# Patient Record
Sex: Female | Born: 1935 | Race: White | Hispanic: No | Smoking: Former smoker
Health system: Southern US, Community
[De-identification: ages and names within clinical notes are randomized; demographics above are authoritative.]

## PROBLEM LIST (undated history)

## (undated) DIAGNOSIS — C50919 Malignant neoplasm of unspecified site of unspecified female breast: Secondary | ICD-10-CM

## (undated) DIAGNOSIS — D126 Benign neoplasm of colon, unspecified: Secondary | ICD-10-CM

## (undated) DIAGNOSIS — C8590 Non-Hodgkin lymphoma, unspecified, unspecified site: Secondary | ICD-10-CM

## (undated) DIAGNOSIS — M8580 Other specified disorders of bone density and structure, unspecified site: Secondary | ICD-10-CM

## (undated) DIAGNOSIS — E785 Hyperlipidemia, unspecified: Secondary | ICD-10-CM

## (undated) DIAGNOSIS — C9590 Leukemia, unspecified not having achieved remission: Secondary | ICD-10-CM

## (undated) DIAGNOSIS — K5790 Diverticulosis of intestine, part unspecified, without perforation or abscess without bleeding: Secondary | ICD-10-CM

## (undated) DIAGNOSIS — M1990 Unspecified osteoarthritis, unspecified site: Secondary | ICD-10-CM

## (undated) DIAGNOSIS — K219 Gastro-esophageal reflux disease without esophagitis: Secondary | ICD-10-CM

## (undated) DIAGNOSIS — A048 Other specified bacterial intestinal infections: Secondary | ICD-10-CM

## (undated) DIAGNOSIS — C9500 Acute leukemia of unspecified cell type not having achieved remission: Secondary | ICD-10-CM

## (undated) DIAGNOSIS — F0390 Unspecified dementia without behavioral disturbance: Secondary | ICD-10-CM

## (undated) DIAGNOSIS — C9110 Chronic lymphocytic leukemia of B-cell type not having achieved remission: Secondary | ICD-10-CM

## (undated) DIAGNOSIS — Z853 Personal history of malignant neoplasm of breast: Secondary | ICD-10-CM

## (undated) HISTORY — PX: CATARACT EXTRACTION, BILATERAL: SHX1313

## (undated) HISTORY — DX: Leukemia, unspecified not having achieved remission: C95.90

## (undated) HISTORY — DX: Benign neoplasm of colon, unspecified: D12.6

## (undated) HISTORY — DX: Non-Hodgkin lymphoma, unspecified, unspecified site: C85.90

## (undated) HISTORY — DX: Diverticulosis of intestine, part unspecified, without perforation or abscess without bleeding: K57.90

## (undated) HISTORY — DX: Other specified disorders of bone density and structure, unspecified site: M85.80

## (undated) HISTORY — DX: Hyperlipidemia, unspecified: E78.5

## (undated) HISTORY — DX: Malignant neoplasm of unspecified site of unspecified female breast: C50.919

## (undated) HISTORY — PX: CATARACT EXTRACTION: SUR2

## (undated) HISTORY — PX: LUMPECTOMY: SHX4764

## (undated) HISTORY — PX: EYE SURGERY: SHX253

---
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
## 1984-03-16 HISTORY — PX: OTHER SURGICAL HISTORY: SHX169

## 1997-10-30 ENCOUNTER — Other Ambulatory Visit: Admission: RE | Admit: 1997-10-30 | Discharge: 1997-10-30 | Payer: Self-pay | Admitting: Cardiology

## 1998-12-13 ENCOUNTER — Other Ambulatory Visit: Admission: RE | Admit: 1998-12-13 | Discharge: 1998-12-13 | Payer: Self-pay | Admitting: Cardiology

## 1999-05-02 ENCOUNTER — Encounter: Admission: RE | Admit: 1999-05-02 | Discharge: 1999-05-02 | Payer: Self-pay | Admitting: Cardiology

## 1999-05-02 ENCOUNTER — Encounter: Payer: Self-pay | Admitting: Cardiology

## 1999-07-17 ENCOUNTER — Encounter: Payer: Self-pay | Admitting: Cardiology

## 1999-07-17 ENCOUNTER — Encounter: Admission: RE | Admit: 1999-07-17 | Discharge: 1999-07-17 | Payer: Self-pay | Admitting: Cardiology

## 1999-07-24 ENCOUNTER — Encounter: Admission: RE | Admit: 1999-07-24 | Discharge: 1999-07-24 | Payer: Self-pay | Admitting: Cardiology

## 1999-07-24 ENCOUNTER — Encounter: Payer: Self-pay | Admitting: Cardiology

## 2000-03-30 ENCOUNTER — Other Ambulatory Visit: Admission: RE | Admit: 2000-03-30 | Discharge: 2000-03-30 | Payer: Self-pay | Admitting: Family Medicine

## 2000-07-05 ENCOUNTER — Encounter: Payer: Self-pay | Admitting: Family Medicine

## 2000-07-05 ENCOUNTER — Ambulatory Visit (HOSPITAL_COMMUNITY): Admission: RE | Admit: 2000-07-05 | Discharge: 2000-07-05 | Payer: Self-pay | Admitting: Family Medicine

## 2000-08-23 ENCOUNTER — Encounter: Payer: Self-pay | Admitting: Internal Medicine

## 2000-08-23 ENCOUNTER — Encounter: Admission: RE | Admit: 2000-08-23 | Discharge: 2000-08-23 | Payer: Self-pay | Admitting: Internal Medicine

## 2001-09-08 ENCOUNTER — Encounter: Admission: RE | Admit: 2001-09-08 | Discharge: 2001-09-08 | Payer: Self-pay | Admitting: Internal Medicine

## 2001-09-08 ENCOUNTER — Encounter: Payer: Self-pay | Admitting: Internal Medicine

## 2002-10-27 ENCOUNTER — Encounter: Payer: Self-pay | Admitting: Internal Medicine

## 2002-10-27 ENCOUNTER — Encounter: Admission: RE | Admit: 2002-10-27 | Discharge: 2002-10-27 | Payer: Self-pay | Admitting: Internal Medicine

## 2003-12-28 ENCOUNTER — Encounter: Admission: RE | Admit: 2003-12-28 | Discharge: 2003-12-28 | Payer: Self-pay | Admitting: Internal Medicine

## 2004-04-14 ENCOUNTER — Inpatient Hospital Stay (HOSPITAL_COMMUNITY): Admission: AD | Admit: 2004-04-14 | Discharge: 2004-04-16 | Payer: Self-pay | Admitting: Internal Medicine

## 2004-05-06 ENCOUNTER — Encounter: Admission: RE | Admit: 2004-05-06 | Discharge: 2004-05-06 | Payer: Self-pay | Admitting: Specialist

## 2004-05-20 ENCOUNTER — Encounter: Admission: RE | Admit: 2004-05-20 | Discharge: 2004-05-20 | Payer: Self-pay | Admitting: Specialist

## 2005-02-16 ENCOUNTER — Encounter: Admission: RE | Admit: 2005-02-16 | Discharge: 2005-02-16 | Payer: Self-pay | Admitting: Internal Medicine

## 2006-02-26 ENCOUNTER — Encounter: Admission: RE | Admit: 2006-02-26 | Discharge: 2006-02-26 | Payer: Self-pay | Admitting: Internal Medicine

## 2006-03-08 ENCOUNTER — Encounter: Admission: RE | Admit: 2006-03-08 | Discharge: 2006-03-08 | Payer: Self-pay | Admitting: Internal Medicine

## 2006-08-30 ENCOUNTER — Encounter: Admission: RE | Admit: 2006-08-30 | Discharge: 2006-08-30 | Payer: Self-pay | Admitting: Internal Medicine

## 2006-11-09 ENCOUNTER — Inpatient Hospital Stay (HOSPITAL_COMMUNITY): Admission: AD | Admit: 2006-11-09 | Discharge: 2006-11-10 | Payer: Self-pay | Admitting: Internal Medicine

## 2007-04-07 ENCOUNTER — Encounter: Admission: RE | Admit: 2007-04-07 | Discharge: 2007-04-07 | Payer: Self-pay | Admitting: Internal Medicine

## 2007-08-01 ENCOUNTER — Ambulatory Visit: Payer: Self-pay | Admitting: Gastroenterology

## 2007-08-01 ENCOUNTER — Encounter (INDEPENDENT_AMBULATORY_CARE_PROVIDER_SITE_OTHER): Payer: Self-pay | Admitting: *Deleted

## 2007-08-15 ENCOUNTER — Ambulatory Visit: Payer: Self-pay | Admitting: Gastroenterology

## 2008-03-16 HISTORY — PX: KNEE ARTHROSCOPY: SUR90

## 2008-06-22 ENCOUNTER — Encounter: Admission: RE | Admit: 2008-06-22 | Discharge: 2008-06-22 | Payer: Self-pay | Admitting: Internal Medicine

## 2008-07-20 ENCOUNTER — Encounter: Admission: RE | Admit: 2008-07-20 | Discharge: 2008-07-20 | Payer: Self-pay | Admitting: Internal Medicine

## 2008-08-31 ENCOUNTER — Ambulatory Visit (HOSPITAL_COMMUNITY): Admission: RE | Admit: 2008-08-31 | Discharge: 2008-08-31 | Payer: Self-pay | Admitting: Internal Medicine

## 2009-01-14 ENCOUNTER — Ambulatory Visit (HOSPITAL_BASED_OUTPATIENT_CLINIC_OR_DEPARTMENT_OTHER): Admission: RE | Admit: 2009-01-14 | Discharge: 2009-01-14 | Payer: Self-pay | Admitting: Specialist

## 2009-02-13 ENCOUNTER — Encounter: Admission: RE | Admit: 2009-02-13 | Discharge: 2009-03-14 | Payer: Self-pay | Admitting: Specialist

## 2009-03-17 ENCOUNTER — Encounter: Admission: RE | Admit: 2009-03-17 | Discharge: 2009-06-15 | Payer: Self-pay | Admitting: Specialist

## 2009-07-29 ENCOUNTER — Encounter: Admission: RE | Admit: 2009-07-29 | Discharge: 2009-07-29 | Payer: Self-pay | Admitting: Internal Medicine

## 2010-04-15 NOTE — Miscellaneous (Signed)
Summary: [facility] previsit/allergies  Clinical Lists Changes  Allergies: Added new allergy or adverse reaction of * CLARITIN D Added new allergy or adverse reaction of * DETROL LA

## 2010-04-15 NOTE — Miscellaneous (Signed)
Summary: [facility] previsit/ prep  Clinical Lists Changes  Medications: Added new medication of MOVIPREP 100 GM  SOLR (PEG-KCL-NACL-NASULF-NA ASC-C) As per prep instructions. - Signed Rx of MOVIPREP 100 GM  SOLR (PEG-KCL-NACL-NASULF-NA ASC-C) As per prep instructions.;  #1 x 0;  Signed;  Entered by: Wyona Almas RN;  Authorized by: Mardella Layman MD [facility];  Method used: Print then Give to Patient Allergies: Added new allergy or adverse reaction of PENICILLIN Observations: Added new observation of NKA: F (08/01/2007 9:55)    Prescriptions: MOVIPREP 100 GM  SOLR (PEG-KCL-NACL-NASULF-NA ASC-C) As per prep instructions.  #1 x 0   Entered by:   Wyona Almas RN   Authorized by:   Mardella Layman MD [facility]   Signed by:   Wyona Almas RN on 08/01/2007   Method used:   Print then Give to Patient   RxID:   [id number]

## 2010-04-15 NOTE — Procedures (Signed)
Summary: Colonoscopy   Colonoscopy  Procedure date:  08/15/2007  Findings:      Location:  [facility].    Procedures Next Due Date:    Colonoscopy: 08/2012  Patient Name: Brianna York, Brianna York MRN:  Procedure Procedures: Colonoscopy CPT: 16109.  Personnel: Endoscopist: Vania Rea. Jarold Motto, MD.  Exam Location: Exam performed in Outpatient Clinic. Outpatient  Patient Consent: Procedure, Alternatives, Risks and Benefits discussed, consent obtained, from patient. Consent was obtained by the RN.  Indications  Surveillance of: Adenomatous Polyp(s).  Increased Risk Screening: For family history of colorectal neoplasia, in  parent  History Comments: Pt. history reviewed/updated, physical exam performed prior to initiation of sedation? yes Exam Exam: Extent of exam reached: Cecum, extent intended: Cecum.  The cecum was identified by appendiceal orifice and IC valve. Patient position: on left side. Time to Cecum: 00:05:12. Time for Withdrawl: 00:04:50. Colon retroflexion performed. Images taken. ASA Classification: II. Tolerance: good.  Monitoring: Pulse and BP monitoring, Oximetry used. Supplemental O2 given. at 2 Liters.  Colon Prep Used Golytely for colon prep. Prep results: excellent.  Sedation Meds: Patient assessed and found to be appropriate for moderate (conscious) sedation. Fentanyl 75 mcg. given IV. Versed 7 mg. given IV.  Instrument(s): CF 140L. Serial [id number].  Findings - NORMAL EXAM: Cecum to Splenic Flexure. Not Seen: Polyps. AVM's. Colitis. Tumors. Crohn's.  - DIVERTICULOSIS: Descending Colon to Sigmoid Colon. Not bleeding. ICD9: Diverticulosis, Colon: 562.10. Comments: Scattered small mouthed tics noted.  - NORMAL EXAM: Sigmoid Colon to Rectum. AVM's. Tumors. Crohn's. Hemorrhoids.   Assessment  Diagnoses: 562.10: Diverticulosis, Colon.   Comments: Family History of colon CA in her mother.... Events  Unplanned Interventions: No  intervention was required.  Plans Medication Plan: Referring provider to order medications.  Patient Education: Patient given standard instructions for: Diverticulosis. Patient instructed to get routine colonoscopy every 5 years.  Disposition: After procedure patient sent to recovery. After recovery patient sent home.  Scheduling/Referral: Follow-Up prn.     cc.   Loraine Leriche Perini,MD   This report was created from the original endoscopy report, which was reviewed and signed by the above listed endoscopist.

## 2010-06-18 LAB — POCT HEMOGLOBIN-HEMACUE: Hemoglobin: 14.6 g/dL (ref 12.0–15.0)

## 2010-07-16 ENCOUNTER — Other Ambulatory Visit: Payer: Self-pay | Admitting: Internal Medicine

## 2010-07-16 DIAGNOSIS — Z1231 Encounter for screening mammogram for malignant neoplasm of breast: Secondary | ICD-10-CM

## 2010-07-29 NOTE — Cardiovascular Report (Signed)
NAMEDONELL, SLIWINSKI NO.:  [id number]   MEDICAL RECORD NO.:  [id number]          PATIENT TYPE:  INP   LOCATION:  3705                         FACILITY:  [facility]   PHYSICIAN:  Vesta Mixer, M.D. DATE OF BIRTH:  1935-02-12   DATE OF PROCEDURE:  11/10/2006  DATE OF DISCHARGE:                            CARDIAC CATHETERIZATION   HISTORY:  Ms. Ragan is a 76 year old female with a history of  hyperlipidemia and a strong family history of coronary artery disease.  She is referred for heart catheterization for further evaluation.   She has had some chest pains and intrascapular pain which were very  worrisome for cardiac disease.  She also described having an elephant on  her chest-like sensation.   The procedure was left heart catheterization with coronary angiography.  The right femoral artery was easily cannulated using the modified  Seldinger technique.   HEMODYNAMICS:  LV pressure was 142/18 with an aortic pressure of 142/66.   ANGIOGRAPHY:  Left main:  The left main is smooth and normal.   The left anterior descending artery is a moderate-sized vessel.  It has  perhaps a few minor luminal irregularities but is essentially normal.   The first diagonal artery is a moderate-sized vessel and is normal.   The ramus intermediate artery is smooth and normal.  The left circumflex  artery is large and dominant.  It is smooth and normal throughout its  course.   The right coronary artery is small and is nondominant.   The left ventriculogram reveals normal/supernormal left ventricular  systolic function.  Ejection fraction is 65-70%.   COMPLICATIONS:  None.   CONCLUSIONS:  Fairly smooth and normal coronary arteries.  There were no  obstructive lesions to suggest that this chest pain was due to cardiac  disease.  We will continue with medical therapy and she will follow up  with Dr. Waynard Edwards as needed.           ______________________________  Vesta Mixer, M.D.    PJN/MEDQ  D:  11/10/2006  T:  11/11/2006  Job:  [id number]   cc:   Loraine Leriche A. Perini, M.D.

## 2010-07-29 NOTE — Consult Note (Signed)
[name], York NO.:  [id number]   MEDICAL RECORD NO.:  [id number]          PATIENT TYPE:  INP   LOCATION:  3705                         FACILITY:  [facility]   PHYSICIAN:  Vesta Mixer, M.D. DATE OF BIRTH:  1934-04-08   DATE OF CONSULTATION:  11/09/2006  DATE OF DISCHARGE:                                 CONSULTATION   Brianna York is a 76 year old female who was admitted with episodes  of chest pain.   Brianna York has a history of hyperlipidemia.  She was admitted with  intermittent episodes of chest pain.  This chest pain is described as a  pressure, like a elephant on my chest.  The pain typically occurs when  she gets out of bed.  It lasts for several minutes and then resolves as  she moves about.  There is no association with eating or drinking.  There is no association with exercise.  She tries to walk about a mile  every day and has not noticed any chest pressure with her exercise.  She  denies any PND or orthopnea.  She denies any syncope or presyncope.  She  denies any easy bruising.  She denies any cough or sputum production.  She denies any change in her bowel habits, blood in her urine or blood  her stool.   CURRENT MEDICATIONS:  Lipitor and Fosamax.  Toprol 25 and aspirin were  added this admission.   ALLERGIES:  She is allergic to PENICILLIN and DETROL.   PAST MEDICAL HISTORY:  1. Hyperlipidemia.  2. Osteoporosis.   SOCIAL HISTORY:  The patient has a history of remote smoking.  She  drinks alcohol occasionally.   FAMILY HISTORY:  Is positive cardiac problems.   REVIEW OF SYSTEMS:  Is reviewed and is essentially negative except as  noted in the HPI.   PHYSICAL EXAMINATION:  GENERAL:  She is an elderly female in no acute  distress.  She is currently pain free.  VITAL SIGNS:  Her temperature is 97.5, blood pressure 140/76, heart rate  78.  HEENT/NECK:  Exam reveals 2+ carotids.  No bruits, no JVD, no  thyromegaly.  LUNGS:  Clear  to call auscultation.  HEART:  Regular rate, S1-S2.  ABDOMEN:  Exam reveals good bowel sounds and is nontender.  EXTREMITIES:  Show no clubbing, cyanosis or edema.  NEUROLOGIC:  Exam is nonfocal.   EKG reveals normal sinus rhythm.  She has no ST or T wave change.   Chest x-ray is pending.   Laboratory data is pending.   Brianna York presents with some episodes of chest pain that are somewhat  concerning.  She describes the pressure is an elephant on her chest week  with radiation to the interscapular region.  I have discussed the case  with Dr. Eloise Harman.  She has never had many complaints.  At this point, I  think her symptoms are concerning, and we should proceed with heart  catheterization.  We will continue with heparin for the time being.  We  will discuss cardiac catheterization with the patient.  ______________________________  Vesta Mixer, M.D.     PJN/MEDQ  D:  11/09/2006  T:  11/10/2006  Job:  [id number]   cc:   Barry Dienes. Eloise Harman, M.D.  Loraine Leriche A. Perini, M.D.

## 2010-07-29 NOTE — H&P (Signed)
NAMEBRISTAL, Brianna York             ACCOUNT NO.:  [id number]   MEDICAL RECORD NO.:  [id number]          PATIENT TYPE:  INP   LOCATION:  3705                         FACILITY:  [facility]   PHYSICIAN:  Barry Dienes. Eloise Harman, M.D.DATE OF BIRTH:  09-18-1934   DATE OF ADMISSION:  11/09/2006  DATE OF DISCHARGE:                              HISTORY & PHYSICAL   CHIEF COMPLAINT:  Chest pain.   HISTORY OF PRESENT ILLNESS:  The patient is a 76 year old white female  with no history of coronary artery disease.  Three to four days ago she  began to have a mild and persistent substernal chest pain that was  somewhat worse with breathing in deep and not associated with shortness  of breath, diaphoresis, or radiation.  Yesterday morning when she first  woke up, she had severe substernal chest tightness which felt like an  elephant on her chest.  This discomfort lasted approximately 5 minutes  and resolved spontaneously with getting up and going about her day.  She  was well throughout the rest of yesterday.  Again this morning at  approximately 5:00 a.m., she awoke up with a similar feeling of an  elephant on her chest that lasted for approximately 5 minutes and  resolved with getting up.  She has a history of mild gastroesophageal  reflux disease and has been taking Prilosec for approximately the past  week.  This did not seem to be associated with a change in her symptoms.  At the time of hospital admission, she felt fine with no chest pain,  shortness of breath, palpitations, back pain, fever, or chills.   PAST MEDICAL HISTORY:  Otherwise significant for:  1. Osteoporosis.  2. Osteoarthritis, particularly of the lumbar spine.  3. Vitamin D deficiency with a 25 hydroxy vitamin D level 16.9 in      April 2008.  4. Chronic low back pain and scoliosis with workup showing severe      lumbar spine stenosis for which she received cortisone injections      at the left L5-S1 area in 2006.  5. Mild  gastroesophageal reflux disease.  6. 2004 adenoma polyps diagnosed on colonoscopy.  7. Mild stress urinary incontinence.  8. Atrophic vaginitis.  9. October 1997 episode of amaurosis fugax on the right with a      neurology evaluation and full workup unremarkable.   MEDICATIONS PRIOR TO ADMISSION:  1. Fosamax D 70 mg once weekly.  2. Lipitor 10 mg daily.  3. Vicodin 5/500 as needed for osteoarthritis pain.  4. Robaxin 750 mg p.o. b.i.d. p.r.n. muscle spasms.  5. Caltrate D 600 p.r.n.  6. Aspirin 81 mg daily.  7. Vitamin C p.r.n.   ALLERGIES:  1. PENICILLIN.  2. CLARITIN-D has been associated with feeling anxious.  3. DETROL LA has been associated with nausea.   PAST SURGICAL HISTORY:  1. Eye surgeries x2 for anopia  2. Several C-sections.  3. Bilateral vein stripping of the legs.  4. Dilatation and curettage.  5. 2006 lumbar spine cortisone injections.   SOCIAL HISTORY:  She has been a widow since 68.  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  She has tobacco history of 30 pack years, stopped in 1990, and no  history of alcohol abuse.   FAMILY HISTORY:  Father died at age 10 of leukemia.  Her mother died at  age 52 of emphysema. She has three sisters, two of which had diabetes  mellitus, type 2.  She has five children that are scattered in [place]  [place], [place], and [place]   REVIEW OF SYSTEMS:  Lately she has had a dry cough without fever or  chills.  She has not had constipation, diarrhea, abdominal pain, rectal  bleeding, nausea, vomiting, dysuria or frequency, headaches, anxiety, or  depression.   PHYSICAL EXAMINATION:  VITAL SIGNS:  Blood pressure 130/70, pulse 76,  respirations 20, temperature  98.4, weight 141 pounds.  GENERAL:  She is a mildly overweight white female who is in no apparent  distress while sitting upright in a chair.  HEENT:  Exam was within normal limits.  NECK:  Supple without jugular  venous distention or carotid bruit.  CHEST:  Was clear to auscultation.  There is no spine tenderness to  palpation.  HEART:  Had a regular rate and rhythm without significant murmur or  gallop.  ABDOMEN:  Had normal bowel sounds and no hepatosplenomegaly or  tenderness.  EXTREMITIES:  Without cyanosis, clubbing, or edema.  NEUROLOGIC:  Exam was nonfocal   LABORATORY STUDIES:  Office EKG showed the following.  1. Normal sinus rhythm.  2. RSR prime in lead V1.  3. Low-amplitude QRS voltages in the precordial leads.  4. No significant interval change versus June 29, 2005.   Hospital admission labs including cardiac isoenzymes, CMET, CBC, and  chest x-ray were pending at the time of dictation.   IMPRESSION AND PLAN:  1. Chest pain:  She has some concerning features for coronary artery      disease with significant diffuse substernal pressure.  Her symptoms      occurred at rest and could be due to vasospasm in the face of no      baseline coronary disease.  Less likely, her symptoms could be due      to gastroesophageal reflux disease, gastritis, or hepatobiliary      pathology.  I plan to admit her to a telemetry bed and check serial      cardiac isoenzymes and EKGs.  She will be placed on a beta blocker      and low-dose calcium channel blocker as well as high-dose proton      pump inhibitor      treatment.  A cardiologist has been asked to assess her case.  2. Osteoporosis:  Stable on her current medical regimen he has  3. Lumbar spine degenerative disk disease:  Again, stable on her      current medical regimen.           ______________________________  Barry Dienes. Eloise Harman, M.D.     DGP/MEDQ  D:  11/09/2006  T:  11/10/2006  Job:  [id number]   cc:   Vania Rea. Jarold Motto, MD, [place], FACP, FAGA  Jene Every, M.D.  Vesta Mixer, M.D.  Delon Sacramento, M.D.

## 2010-08-01 NOTE — H&P (Signed)
Brianna York, Brianna York             ACCOUNT NO.:  [id number]   MEDICAL RECORD NO.:  [id number]          PATIENT TYPE:  INP   LOCATION:  6702                         FACILITY:  [facility]   PHYSICIAN:  Mark A. Perini, M.D.   DATE OF BIRTH:  06/29/1934   DATE OF ADMISSION:  04/14/2004  DATE OF DISCHARGE:                                HISTORY & PHYSICAL   CLINICAL HISTORY:  Low back pain.   HISTORY OF PRESENT ILLNESS:  Brianna York is a pleasant 76 year old female  with a past medical history significant for hyperlipidemia and  osteoarthritis of the hips, knees and back, mild stress urinary incontinence  and overactive bladder.  She did have a history of some back pain somewhat  chronically and she had a history of some degenerative joint disease and  degenerative disk disease changes by an x-ray done on 2001.  She presented  to our office on April 08, 2004, reporting some on and oft back pain for  the last one to two months.  However, 10 days prior to her presentation on  April 08, 2004, she picked up some pecans in her yard and her low back  pain flared up.  For five days prior to April 08, 2004, she had severe low  back pain.  She denied any saddle anesthesia symptoms.  She had tried  Lidoderm patches, Ultracet, Aleve and ibuprofen with no help.  She denies  any fevers.  On exam, she had normal reflexes, no significant tenderness and  no straight leg raise sign bilaterally.  She was treated with a prednisone  taper and given a proton pump inhibitor, as well as Celebrex, hydrocodone  and a Lidoderm patch.  She was also shown stretches for her low back.  She  was scheduled to have an MRI of her low back.  She was unable to accomplish  an MRI due to severe pain whenever she would lie in the MRI scanner.  She  therefore had her medicine changed from hydrocodone to Tylox.  Again today  she attempted to do an MRI of her low back.  She was unable to do this due  to severe pain.  Therefore,  she presented to our office today.  She will be  admitted electively for further evaluation of her low back pain.  She  currently rates it as 7/10 in severity.   PAST MEDICAL HISTORY:  1.  Eye surgery x 2 as a toddler for ambiopia.  2.  History of three C-sections with G5, P5 parity status.  3.  History of vein stripping in the legs.  4.  History of D&C.  5.  Hyperlipidemia.  6.  Degenerative disk disease and degenerative joint disease of the back by      x-ray in 2001.  7.  Osteoarthritis of the knees, hips and back.  8.  Mild overactive bladder and stress urinary incontinence.  9.  Postmenopausal at age 62.  10. Mild gastrointestinal reflux.  11. Osteopenia.   ALLERGIES:  1.  PENICILLIN.  2.  CLARITIN.   CURRENT MEDICATIONS:  1.  Lipitor 10 mg daily.  2.  Celebrex 200 mg daily.  3.  Tylenol PM q.h.s.  4.  Vagifem twice weekly per vagina.  5.  Tums or Zantac as needed.  6.  Lidoderm pain patch recently.  7.  Oxycodone 5/500 mg two tablets q.6h. as needed for pain.   SOCIAL HISTORY:  She has been a widow since 25.  She has 5 children and 14  grandchildren.  She has two years of college education.  She has a 30-pack-  year smoking history, but quit in 1990.  She has one alcoholic drink a week.  No drug use history.   FAMILY HISTORY:  Father died at age 71 of leukemia.  Mother is still living  at age 68 and has emphysema.  She has three younger brothers, two of which  have diabetes.  Her five children are healthy.   REVIEW OF SYSTEMS:  As per the history of present illness.   PHYSICAL EXAMINATION:  VITAL SIGNS:  Blood pressure 102/70, pulse 84.  GENERAL APPEARANCE:  She is in no acute distress.  LUNGS:  Clear to auscultation bilaterally with no wheezing, rhonchi or  rales.  HEART:  Regular rate and rhythm with no murmur, rub or gallop.  ABDOMEN:  Soft and nontender.  EXTREMITIES:  She has normal sensation of her lower extremities.  There is  negative straight leg  raise sign bilaterally.  DTRs 2+ bilaterally.  Normal  sensation grossly.   LABORATORY DATA:  Pending on admission.   ASSESSMENT AND PLAN:  Severe low back pain.  I suspect this is on the basis  of degenerative joint and degenerative disk disease.  I have been unable to  get an MRI given the patient's severe pain.  I will therefore admit her and  give her IV morphine, as well as oral oxycodone.  Will obtain an orthopedic  consultation to help determine the next step.  We may do an MRI with further  pain control versus a CT scan.  She may need an epidural pain shot or other  management to help treat her symptoms.      MAP/MEDQ  D:  04/14/2004  T:  04/14/2004  Job:  [id number]

## 2010-08-01 NOTE — Discharge Summary (Signed)
Brianna York, Brianna York             ACCOUNT NO.:  [id number]   MEDICAL RECORD NO.:  [id number]          PATIENT TYPE:  INP   LOCATION:  6702                         FACILITY:  [facility]   PHYSICIAN:  Mark A. Perini, M.D.   DATE OF BIRTH:  1934/11/02   DATE OF ADMISSION:  04/14/2004  DATE OF DISCHARGE:  04/16/2004                                 DISCHARGE SUMMARY   DISCHARGE DIAGNOSES:  1.  Severe multifactorial lumbar spine stenosis at L4-5 with significant      foraminal stenosis on the left at L5-S1 and significant foraminal      stenosis on the right at L3-4 and L4-5.  2.  Severe low back pain due to #1.  3.  Hyperlipidemia.  4.  Osteoarthritis of the knees, hips, and back.  5.  Overactive bladder.  6.  Mild gastroesophageal reflux, controlled currently.  7.  Osteopenia.   PROCEDURES:  1.  Orthopedic consultation, MRI of the low back performed on April 16, 2003, which showed findings as above.  In addition, there was moderate      spinal stenosis at L3-4 and mild spinal stenosis at L2-3 and L1-2.  This      was all multifactorial in nature.  2.  Epidural steroid shot on the left at L5-S1 done on 04/15/04 by radiology.   DISCHARGE MEDICATIONS:  Brianna York is to resume her home medications which  are to include Lipitor 10 mg daily, Celebrex 200 mg daily, Tylenol PM as  needed, Vagifem twice weekly per vagina, Zantac as needed, Lidoderm patch to  her back, on 12 hours, off 12 hours, Tylox 5/325 one to two tablets every 4-  6 hours as needed for pain.  Robaxin 750 mg one q.6h. as needed for pain.   HISTORY OF PRESENT ILLNESS:  Brianna York is a pleasant 76 year old female who  presented with severe low back of three weeks duration.  She was unable to  perform an outpatient MRI due to her back pain.  She was therefore admitted  for further management.   HOSPITAL COURSE:  Brianna York was admitted to a regular bed.  She remained  stable from a cardiovascular standpoint during her  entire stay.  She was  treated with intravenous morphine and intravenous Robaxin with some relief  of pain.  MRI and orthopedic consultation were obtained.  The patient  subsequently underwent an epidural steroid shot on April 15, 2004 with  good immediate results.  By April 16, 2004, she was deemed stable for  discharge home with continued outpatient follow-up.   DISCHARGE PHYSICAL EXAMINATION:  Afebrile, temperature 97.7, pulse 88  respiratory rate 18, blood pressure 111/74, 97% saturation on room air.  She  was in no acute distress.  Lungs were clear to auscultation bilaterally.  Heart was regular rate and rhythm with no murmur.  Abdomen was soft and  nontender.  There was no edema.   NOTABLE LABORATORY DATA:  Highly sensitive C reactive protein was 1.8.  Sedimentation rate was normal at 6.  CBC with differential was normal with a  white  count of 6.9, normal differential, hemoglobin 14.6, platelet count  219, Coagulation studies were normal.  Phosphorus was 4.1.  CMET was normal  on admission with the except of a glucose of 157, but this was a nonfasting  sample.   DISCHARGE INSTRUCTIONS:  Brianna York is to be up as tolerated.  She is to  follow up in our office in two days with a previously scheduled physical  examination, and she is to call Dr. Ermelinda Das office for a follow-up visit in  2-4 weeks.  She may need further epidural shots and will need further  orthopedic follow-up for her lumbar spine stenosis.      MAP/MEDQ  D:  04/16/2004  T:  04/16/2004  Job:  [id number]   cc:   Jene Every, M.D.  [address]  [place]  [place] [address]  Fax: [phone]

## 2010-08-01 NOTE — Discharge Summary (Signed)
Brianna York, Brianna York             ACCOUNT NO.:  [id number]   MEDICAL RECORD NO.:  [id number]          PATIENT TYPE:  INP   LOCATION:  3705                         FACILITY:  [facility]   PHYSICIAN:  Mark A. Perini, M.D.   DATE OF BIRTH:  [birth year]   DATE OF ADMISSION:  11/09/2006  DATE OF DISCHARGE:  11/10/2006                               DISCHARGE SUMMARY   DISCHARGE DIAGNOSES:  1. Noncardiac chest pain  2. Gastroesophageal reflux.  3. Osteoporosis.  4. Osteoarthritis.  5. Vitamin D deficiency being replaced.  6. Chronic low back pain.  7. Stress urinary incontinence   PROCEDURE:  Is cardiology consultation and cardiac catheterization which  showed normal smooth coronary arteries and normal LV function.  This was  performed on November 10, 2006.   DISCHARGE MEDICATIONS:  1. 81 mg aspirin daily.  2. Fosamax 70 mg once a week.  3. Lipitor 10 mg daily.  4. Vicodin 5/500 as needed.  5. Robaxin 750 mg as needed.  6. Caltrate daily.  7. Vitamin C daily.  8. Furthermore she is to take Prilosec 20 mg daily.   HISTORY OF PRESENT ILLNESS:  Brianna York is a 76 year old female who  presented to our office with chest pains.  This had occurred as several  episodes lasting 5 minutes and felt as though an elephant was sitting on  her chest.  She was therefore admitted for further evaluation and  treatment.   HOSPITAL COURSE:  Brianna York was admitted.  She was ruled out myocardial  infarction with serial enzymes and electrocardiograms 17, however, given  the nature of her symptoms.  She underwent cardiac catheterization with  the results as noted above and she was discharged home on November 10, 2006.   DISCHARGE PHYSICAL EXAM:  GENERAL:  She was in no acute distress.  VITAL SIGNS:  Afebrile, pulse 68, respiratory 20, blood pressure 104/60,  97% saturation on room air.  LUNGS: Clear to auscultation bilaterally with no wheezes, rales or  rhonchi.  HEART: Regular rate and rhythm with no  murmur or gallop.  ABDOMEN: Soft and nontender.  EXTREMITIES:  There is no edema   DISCHARGE LABORATORY DATA:  On 08/27 white count 6.5, hemoglobin 13.4,  platelet count 182,000. INR was normal at 1.1 sodium 143, potassium 4,  chloride 106, CO2 28, BUN 11, creatinine 0.96. Liver tests were within  normal range.   DISCHARGE INSTRUCTIONS:  Brianna York is to resume her normal medications.  She is to follow up with Dr. Waynard Edwards in 1-2 weeks.  She is to call if she  has any recurrent problems.           ______________________________  Redge Gainer Waynard Edwards, M.D.     MAP/MEDQ  D:  11/20/2006  T:  11/21/2006  Job:  [id number]   cc:   Vesta Mixer, M.D.

## 2010-08-04 ENCOUNTER — Ambulatory Visit
Admission: RE | Admit: 2010-08-04 | Discharge: 2010-08-04 | Disposition: A | Discharge status: Home / Self Care | Payer: Medicare Other | Source: Ambulatory Visit | Attending: Internal Medicine | Admitting: Internal Medicine

## 2010-08-04 DIAGNOSIS — Z1231 Encounter for screening mammogram for malignant neoplasm of breast: Secondary | ICD-10-CM

## 2010-08-06 ENCOUNTER — Other Ambulatory Visit: Payer: Self-pay | Admitting: Internal Medicine

## 2010-08-06 DIAGNOSIS — N63 Unspecified lump in unspecified breast: Secondary | ICD-10-CM

## 2010-08-20 ENCOUNTER — Other Ambulatory Visit: Payer: Medicare Other

## 2010-08-25 ENCOUNTER — Ambulatory Visit
Admission: RE | Admit: 2010-08-25 | Discharge: 2010-08-25 | Disposition: A | Discharge status: Home / Self Care | Payer: Medicare Other | Source: Ambulatory Visit | Attending: Internal Medicine | Admitting: Internal Medicine

## 2010-08-25 ENCOUNTER — Other Ambulatory Visit: Payer: Self-pay | Admitting: Internal Medicine

## 2010-08-25 ENCOUNTER — Other Ambulatory Visit: Payer: Self-pay | Admitting: Diagnostic Radiology

## 2010-08-25 DIAGNOSIS — N63 Unspecified lump in unspecified breast: Secondary | ICD-10-CM

## 2010-08-26 ENCOUNTER — Other Ambulatory Visit: Payer: Self-pay | Admitting: Internal Medicine

## 2010-08-26 ENCOUNTER — Ambulatory Visit
Admission: RE | Admit: 2010-08-26 | Discharge: 2010-08-26 | Disposition: A | Discharge status: Home / Self Care | Payer: Medicare Other | Source: Ambulatory Visit | Attending: Internal Medicine | Admitting: Internal Medicine

## 2010-08-26 DIAGNOSIS — C50912 Malignant neoplasm of unspecified site of left female breast: Secondary | ICD-10-CM

## 2010-08-26 DIAGNOSIS — N63 Unspecified lump in unspecified breast: Secondary | ICD-10-CM

## 2010-08-29 ENCOUNTER — Ambulatory Visit
Admission: RE | Admit: 2010-08-29 | Discharge: 2010-08-29 | Disposition: A | Discharge status: Home / Self Care | Payer: Medicare Other | Source: Ambulatory Visit | Attending: Internal Medicine | Admitting: Internal Medicine

## 2010-08-29 DIAGNOSIS — C50912 Malignant neoplasm of unspecified site of left female breast: Secondary | ICD-10-CM

## 2010-08-29 MED ORDER — GADOBENATE DIMEGLUMINE 529 MG/ML IV SOLN
12.0000 mL | Freq: Once | INTRAVENOUS | Status: AC | PRN
  Administered 2010-08-29: 12 mL via INTRAVENOUS

## 2010-09-03 ENCOUNTER — Other Ambulatory Visit: Payer: Self-pay | Admitting: Oncology

## 2010-09-03 ENCOUNTER — Encounter (HOSPITAL_BASED_OUTPATIENT_CLINIC_OR_DEPARTMENT_OTHER): Payer: Medicare Other | Admitting: Oncology

## 2010-09-03 DIAGNOSIS — C50419 Malignant neoplasm of upper-outer quadrant of unspecified female breast: Secondary | ICD-10-CM

## 2010-09-03 LAB — CBC WITH DIFFERENTIAL/PLATELET
BASO%: 0.5 % (ref 0.0–2.0)
Basophils Absolute: 0 10*3/uL (ref 0.0–0.1)
EOS%: 2.1 % (ref 0.0–7.0)
Eosinophils Absolute: 0.1 10*3/uL (ref 0.0–0.5)
HCT: 39.2 % (ref 34.8–46.6)
HGB: 13.4 g/dL (ref 11.6–15.9)
LYMPH%: 27.3 % (ref 14.0–49.7)
MCH: 31.5 pg (ref 25.1–34.0)
MCHC: 34.3 g/dL (ref 31.5–36.0)
MCV: 91.8 fL (ref 79.5–101.0)
MONO#: 0.5 10*3/uL (ref 0.1–0.9)
MONO%: 9.1 % (ref 0.0–14.0)
NEUT#: 3.3 10*3/uL (ref 1.5–6.5)
NEUT%: 61 % (ref 38.4–76.8)
Platelets: 170 10*3/uL (ref 145–400)
RBC: 4.26 10*6/uL (ref 3.70–5.45)
RDW: 13.1 % (ref 11.2–14.5)
WBC: 5.4 10*3/uL (ref 3.9–10.3)
lymph#: 1.5 10*3/uL (ref 0.9–3.3)

## 2010-09-03 LAB — COMPREHENSIVE METABOLIC PANEL
ALT: 18 U/L (ref 0–35)
AST: 29 U/L (ref 0–37)
Albumin: 3.9 g/dL (ref 3.5–5.2)
Alkaline Phosphatase: 73 U/L (ref 39–117)
BUN: 16 mg/dL (ref 6–23)
CO2: 26 mEq/L (ref 19–32)
Calcium: 9.6 mg/dL (ref 8.4–10.5)
Chloride: 105 mEq/L (ref 96–112)
Creatinine, Ser: 0.81 mg/dL (ref 0.50–1.10)
Glucose, Bld: 86 mg/dL (ref 70–99)
Potassium: 4.3 mEq/L (ref 3.5–5.3)
Sodium: 139 mEq/L (ref 135–145)
Total Bilirubin: 0.3 mg/dL (ref 0.3–1.2)
Total Protein: 6.3 g/dL (ref 6.0–8.3)

## 2010-09-03 LAB — CANCER ANTIGEN 27.29: CA 27.29: 17 U/mL (ref 0–39)

## 2010-09-04 ENCOUNTER — Other Ambulatory Visit (INDEPENDENT_AMBULATORY_CARE_PROVIDER_SITE_OTHER): Payer: Self-pay | Admitting: General Surgery

## 2010-09-04 DIAGNOSIS — C50912 Malignant neoplasm of unspecified site of left female breast: Secondary | ICD-10-CM

## 2010-09-15 ENCOUNTER — Encounter (HOSPITAL_BASED_OUTPATIENT_CLINIC_OR_DEPARTMENT_OTHER)
Admission: RE | Admit: 2010-09-15 | Discharge: 2010-09-15 | Disposition: A | Discharge status: Home / Self Care | Payer: Medicare Other | Source: Ambulatory Visit | Attending: General Surgery | Admitting: General Surgery

## 2010-09-15 ENCOUNTER — Other Ambulatory Visit (INDEPENDENT_AMBULATORY_CARE_PROVIDER_SITE_OTHER): Payer: Self-pay | Admitting: General Surgery

## 2010-09-15 ENCOUNTER — Ambulatory Visit
Admission: RE | Admit: 2010-09-15 | Discharge: 2010-09-15 | Disposition: A | Discharge status: Home / Self Care | Payer: Medicare Other | Source: Ambulatory Visit | Attending: General Surgery | Admitting: General Surgery

## 2010-09-15 DIAGNOSIS — Z01811 Encounter for preprocedural respiratory examination: Secondary | ICD-10-CM

## 2010-09-16 ENCOUNTER — Ambulatory Visit (HOSPITAL_COMMUNITY)
Admission: RE | Admit: 2010-09-16 | Discharge: 2010-09-16 | Disposition: A | Discharge status: Home / Self Care | Payer: Medicare Other | Source: Ambulatory Visit | Attending: General Surgery | Admitting: General Surgery

## 2010-09-16 ENCOUNTER — Other Ambulatory Visit (INDEPENDENT_AMBULATORY_CARE_PROVIDER_SITE_OTHER): Payer: Self-pay | Admitting: General Surgery

## 2010-09-16 ENCOUNTER — Ambulatory Visit
Admission: RE | Admit: 2010-09-16 | Discharge: 2010-09-16 | Disposition: A | Discharge status: Home / Self Care | Payer: Medicare Other | Source: Ambulatory Visit | Attending: General Surgery | Admitting: General Surgery

## 2010-09-16 ENCOUNTER — Ambulatory Visit (HOSPITAL_BASED_OUTPATIENT_CLINIC_OR_DEPARTMENT_OTHER)
Admission: RE | Admit: 2010-09-16 | Discharge: 2010-09-16 | Disposition: A | Discharge status: Home / Self Care | Payer: Medicare Other | Source: Ambulatory Visit | Attending: General Surgery | Admitting: General Surgery

## 2010-09-16 DIAGNOSIS — C50419 Malignant neoplasm of upper-outer quadrant of unspecified female breast: Secondary | ICD-10-CM | POA: Insufficient documentation

## 2010-09-16 DIAGNOSIS — C50919 Malignant neoplasm of unspecified site of unspecified female breast: Secondary | ICD-10-CM

## 2010-09-16 DIAGNOSIS — Z01818 Encounter for other preprocedural examination: Secondary | ICD-10-CM | POA: Insufficient documentation

## 2010-09-16 DIAGNOSIS — Z170 Estrogen receptor positive status [ER+]: Secondary | ICD-10-CM | POA: Insufficient documentation

## 2010-09-16 DIAGNOSIS — C50912 Malignant neoplasm of unspecified site of left female breast: Secondary | ICD-10-CM

## 2010-09-16 DIAGNOSIS — N6320 Unspecified lump in the left breast, unspecified quadrant: Secondary | ICD-10-CM

## 2010-09-16 DIAGNOSIS — Z01810 Encounter for preprocedural cardiovascular examination: Secondary | ICD-10-CM | POA: Insufficient documentation

## 2010-09-16 DIAGNOSIS — Z01812 Encounter for preprocedural laboratory examination: Secondary | ICD-10-CM | POA: Insufficient documentation

## 2010-09-16 HISTORY — PX: BREAST LUMPECTOMY: SHX2

## 2010-09-16 MED ORDER — TECHNETIUM TC 99M SULFUR COLLOID FILTERED
1.0000 | Freq: Once | INTRAVENOUS | Status: AC | PRN
  Administered 2010-09-16: 1 via INTRADERMAL

## 2010-09-18 LAB — POCT HEMOGLOBIN-HEMACUE: Hemoglobin: 14.8 g/dL (ref 12.0–15.0)

## 2010-09-19 ENCOUNTER — Telehealth (INDEPENDENT_AMBULATORY_CARE_PROVIDER_SITE_OTHER): Payer: Self-pay | Admitting: General Surgery

## 2010-09-19 NOTE — Telephone Encounter (Signed)
Told pt path ok with neg margins and LN

## 2010-09-23 NOTE — Op Note (Signed)
NAME:  Brianna York, Brianna York             ACCOUNT NO.:  [id number]  MEDICAL RECORD NO.:  [id number]  LOCATION:  CDS                          FACILITY:  [facility] Pre op Dx: Cancer left breast Post op Dx: Same  Procedure: 1. Blue dye injection left breast            2. Needle localized left breast lumpectomy            3. Left axillary sentinal lymph node biopsy  PHYSICIAN:  Sharlet Salina T. Gregroy Dombkowski, M.D.DATE OF BIRTH:  11-09-1934  DATE OF PROCEDURE:  09/16/2010                               OPERATIVE REPORT   HISTORY OF PRESENT ILLNESS:  Ms. Brianna York is a 76 year old female who recently noted an area of thickening in the upper outer quadrant of the left breast.  Subsequent imaging was performed which showed 2 adjacent 1- cm masses in the upper outer quadrant of the left breast.  Biopsy of each of these has shown a low-grade invasive ductal carcinoma, ER positive, PR negative at a low proliferation rate.  The total size of both lesions in conglomerate was 2.8 cm.  It looks like a dumbbell lesion on MRI with no other lesions or adenopathy identified.  We discussed initial surgical management and planned to proceed with breast conservation with needle-localized lumpectomy and left axillary sentinel lymph node biopsy.  The nature of the procedure, its indications, risks of anesthetic complications, bleeding, infection, and risk of further need for surgery had been discussed and understood.  She is now brought to the operating room for this procedure.  DESCRIPTION OF OPERATION:  Following successful needle localization and following injection of 1 mCi of technetium sulfur colloid in the holding area under sedation, the patient brought into the operating room and laryngeal mask general anesthesia induced.  The patient's time-out was performed.  Under sterile technique, I injected 10 mL of dilute methylene blue underneath the nipple and massaged for several minutes. The entire left breast, chest, and  upper arm were then widely sterilely prepped and draped.  She received preoperative IV antibiotics.  The lumpectomy was approached first.  A curvilinear incision was made over the upper outer left breast along the skin crease over the area of marked tumor.  Dissection was carried down to the superficial subcutaneous tissue.  I was able to feel all thickening and the area of the wires.  Short skin and subcutaneous flaps were raised in all directions.  The wires were brought into the incision.  A generous specimen of breast tissue was then excised around the shafts and the tip of the wire and the specimen removed.  Specimen was oriented with ink. Specimen mammography was obtained which showed both clips and the wires appeared to be in the center of the specimen.  Attention was then turned to the sentinel node biopsy.  As this lumpectomy incision was well up toward the tail of Spence, I elected to not make a separate incision but through the upper portion of this incision using the NeoProbe, I was able to localize a hot area in the axilla.  Dissecting a short distance up toward the axilla with careful blunt and cautery dissection, the axilla proper was entered.  Using the NeoProbe, I dissected down onto a slightly enlarged firm lymph node.  It was dissected away from surrounding tissue with cautery and removed intact.  Ex vivo, the node had counts of over 3000.  There was no blue dye.  This was sent as hot nonblue axillary sentinel lymph node #1.  Using the NeoProbe to explore further in the axilla, I did find one other hot area and a small nonblue lymph node was then excised using this for guidance with cautery.  Ex vivo had counts about 600.  This was sent as hot nonblue axillary sentinel lymph node x2.  Following this, there was no significant count at all in the axilla.  Following this, all soft tissue was infiltrated with local anesthesia.  The wound was thoroughly irrigated  and hemostasis was assured.  I did mobilize slightly the breast tissue inferiorly off to the chest wall to allow this to be brought up and closed with deep interrupted Vicryl sutures.  Prior to this, I had marked the lumpectomy cavity with clips.  The subcu was closed with interrupted Vicryl and skin closed with subcuticular Monocryl and Dermabond.  Sponge, needle, instrument counts correct.  The patient was taken to recovery in good condition.     Lorne Skeens. Odelia Graciano, M.D.     Tory Emerald  D:  09/16/2010  T:  09/17/2010  Job:  [id number]  cc:   [facility]  Electronically Signed by Glenna Fellows M.D. on 09/23/2010 03:25:12 PM

## 2010-09-26 ENCOUNTER — Encounter (HOSPITAL_BASED_OUTPATIENT_CLINIC_OR_DEPARTMENT_OTHER): Payer: Medicare Other | Admitting: Oncology

## 2010-09-26 DIAGNOSIS — C50419 Malignant neoplasm of upper-outer quadrant of unspecified female breast: Secondary | ICD-10-CM

## 2010-10-06 ENCOUNTER — Ambulatory Visit
Admission: RE | Admit: 2010-10-06 | Discharge: 2010-10-06 | Disposition: A | Discharge status: Home / Self Care | Payer: Medicare Other | Source: Ambulatory Visit | Attending: Radiation Oncology | Admitting: Radiation Oncology

## 2010-10-06 DIAGNOSIS — C50419 Malignant neoplasm of upper-outer quadrant of unspecified female breast: Secondary | ICD-10-CM | POA: Insufficient documentation

## 2010-10-14 ENCOUNTER — Encounter (HOSPITAL_BASED_OUTPATIENT_CLINIC_OR_DEPARTMENT_OTHER): Payer: Medicare Other

## 2010-10-14 DIAGNOSIS — C50419 Malignant neoplasm of upper-outer quadrant of unspecified female breast: Secondary | ICD-10-CM

## 2010-11-13 ENCOUNTER — Encounter (HOSPITAL_BASED_OUTPATIENT_CLINIC_OR_DEPARTMENT_OTHER): Payer: Medicare Other | Admitting: Oncology

## 2010-11-13 ENCOUNTER — Other Ambulatory Visit: Payer: Self-pay | Admitting: Oncology

## 2010-11-13 DIAGNOSIS — C50419 Malignant neoplasm of upper-outer quadrant of unspecified female breast: Secondary | ICD-10-CM

## 2010-11-13 LAB — COMPREHENSIVE METABOLIC PANEL
ALT: 15 U/L (ref 0–35)
AST: 21 U/L (ref 0–37)
Albumin: 4.1 g/dL (ref 3.5–5.2)
Alkaline Phosphatase: 65 U/L (ref 39–117)
BUN: 14 mg/dL (ref 6–23)
CO2: 26 mEq/L (ref 19–32)
Calcium: 9.3 mg/dL (ref 8.4–10.5)
Chloride: 107 mEq/L (ref 96–112)
Creatinine, Ser: 0.94 mg/dL (ref 0.50–1.10)
Glucose, Bld: 73 mg/dL (ref 70–99)
Potassium: 4.4 mEq/L (ref 3.5–5.3)
Sodium: 141 mEq/L (ref 135–145)
Total Bilirubin: 0.4 mg/dL (ref 0.3–1.2)
Total Protein: 6.1 g/dL (ref 6.0–8.3)

## 2010-11-13 LAB — CBC WITH DIFFERENTIAL/PLATELET
BASO%: 0.5 % (ref 0.0–2.0)
Basophils Absolute: 0 10*3/uL (ref 0.0–0.1)
EOS%: 3.7 % (ref 0.0–7.0)
Eosinophils Absolute: 0.1 10*3/uL (ref 0.0–0.5)
HCT: 38.3 % (ref 34.8–46.6)
HGB: 13.2 g/dL (ref 11.6–15.9)
LYMPH%: 26.7 % (ref 14.0–49.7)
MCH: 31.6 pg (ref 25.1–34.0)
MCHC: 34.5 g/dL (ref 31.5–36.0)
MCV: 91.9 fL (ref 79.5–101.0)
MONO#: 0.5 10*3/uL (ref 0.1–0.9)
MONO%: 11.5 % (ref 0.0–14.0)
NEUT#: 2.3 10*3/uL (ref 1.5–6.5)
NEUT%: 57.6 % (ref 38.4–76.8)
Platelets: 155 10*3/uL (ref 145–400)
RBC: 4.17 10*6/uL (ref 3.70–5.45)
RDW: 13.6 % (ref 11.2–14.5)
WBC: 4 10*3/uL (ref 3.9–10.3)
lymph#: 1.1 10*3/uL (ref 0.9–3.3)

## 2010-12-26 LAB — CBC
HCT: 38.8
HCT: 41.4
Hemoglobin: 13.4
Hemoglobin: 14
MCHC: 33.9
MCHC: 34.5
MCV: 91.1
MCV: 92.8
Platelets: 182
Platelets: 203
RBC: 4.26
RBC: 4.46
RDW: 12.6
RDW: 12.7
WBC: 5.4
WBC: 6.5

## 2010-12-26 LAB — CARDIAC PANEL(CRET KIN+CKTOT+MB+TROPI)
CK, MB: 2.2
Relative Index: INVALID
Total CK: 66
Troponin I: 0.01

## 2010-12-26 LAB — APTT

## 2010-12-26 LAB — COMPREHENSIVE METABOLIC PANEL
ALT: 21
AST: 24
Albumin: 4
Alkaline Phosphatase: 51
BUN: 11
CO2: 28
Calcium: 9.4
Chloride: 106
Creatinine, Ser: 0.96
GFR calc Af Amer: >60
GFR calc non Af Amer: 57 — ABNORMAL LOW
Glucose, Bld: 104 — ABNORMAL HIGH
Potassium: 4
Sodium: 143
Total Bilirubin: 0.5
Total Protein: 6.7

## 2010-12-26 LAB — TROPONIN I: Troponin I: 0.01

## 2010-12-26 LAB — CK
Total CK: 54
Total CK: 56

## 2010-12-26 LAB — HEPARIN LEVEL (UNFRACTIONATED)
Heparin Unfractionated: 1.07 — ABNORMAL HIGH
Heparin Unfractionated: 1.29 — ABNORMAL HIGH

## 2010-12-26 LAB — PROTIME-INR
INR: 1.1
Prothrombin Time: 14.9

## 2011-02-16 ENCOUNTER — Ambulatory Visit (HOSPITAL_BASED_OUTPATIENT_CLINIC_OR_DEPARTMENT_OTHER): Payer: Medicare Other | Admitting: Oncology

## 2011-02-16 ENCOUNTER — Other Ambulatory Visit: Payer: Self-pay | Admitting: Oncology

## 2011-02-16 ENCOUNTER — Encounter: Payer: Self-pay | Admitting: Oncology

## 2011-02-16 ENCOUNTER — Telehealth: Payer: Self-pay | Admitting: *Deleted

## 2011-02-16 ENCOUNTER — Other Ambulatory Visit (HOSPITAL_BASED_OUTPATIENT_CLINIC_OR_DEPARTMENT_OTHER): Payer: Medicare Other | Admitting: Lab

## 2011-02-16 VITALS — BP 121/72 | HR 67 | Temp 97.9°F | Ht 58.5 in | Wt 147.1 lb

## 2011-02-16 DIAGNOSIS — C50919 Malignant neoplasm of unspecified site of unspecified female breast: Secondary | ICD-10-CM

## 2011-02-16 DIAGNOSIS — Z170 Estrogen receptor positive status [ER+]: Secondary | ICD-10-CM

## 2011-02-16 DIAGNOSIS — C50412 Malignant neoplasm of upper-outer quadrant of left female breast: Secondary | ICD-10-CM | POA: Insufficient documentation

## 2011-02-16 DIAGNOSIS — C50419 Malignant neoplasm of upper-outer quadrant of unspecified female breast: Secondary | ICD-10-CM

## 2011-02-16 HISTORY — DX: Malignant neoplasm of unspecified site of unspecified female breast: C50.919

## 2011-02-16 LAB — COMPREHENSIVE METABOLIC PANEL
ALT: 24 U/L (ref 0–35)
AST: 27 U/L (ref 0–37)
Albumin: 4.3 g/dL (ref 3.5–5.2)
Alkaline Phosphatase: 68 U/L (ref 39–117)
BUN: 18 mg/dL (ref 6–23)
CO2: 29 mEq/L (ref 19–32)
Calcium: 9.1 mg/dL (ref 8.4–10.5)
Chloride: 106 mEq/L (ref 96–112)
Creatinine, Ser: 0.88 mg/dL (ref 0.50–1.10)
Glucose, Bld: 88 mg/dL (ref 70–99)
Potassium: 4.4 mEq/L (ref 3.5–5.3)
Sodium: 142 mEq/L (ref 135–145)
Total Bilirubin: 0.5 mg/dL (ref 0.3–1.2)
Total Protein: 6.5 g/dL (ref 6.0–8.3)

## 2011-02-16 LAB — CBC WITH DIFFERENTIAL/PLATELET
BASO%: 0.6 % (ref 0.0–2.0)
Basophils Absolute: 0 10*3/uL (ref 0.0–0.1)
EOS%: 2.9 % (ref 0.0–7.0)
Eosinophils Absolute: 0.1 10*3/uL (ref 0.0–0.5)
HCT: 40.3 % (ref 34.8–46.6)
HGB: 13.6 g/dL (ref 11.6–15.9)
LYMPH%: 28.7 % (ref 14.0–49.7)
MCH: 32 pg (ref 25.1–34.0)
MCHC: 33.9 g/dL (ref 31.5–36.0)
MCV: 94.6 fL (ref 79.5–101.0)
MONO#: 0.5 10*3/uL (ref 0.1–0.9)
MONO%: 10.2 % (ref 0.0–14.0)
NEUT#: 2.6 10*3/uL (ref 1.5–6.5)
NEUT%: 57.6 % (ref 38.4–76.8)
Platelets: 160 10*3/uL (ref 145–400)
RBC: 4.26 10*6/uL (ref 3.70–5.45)
RDW: 13.2 % (ref 11.2–14.5)
WBC: 4.5 10*3/uL (ref 3.9–10.3)
lymph#: 1.3 10*3/uL (ref 0.9–3.3)

## 2011-02-16 NOTE — Telephone Encounter (Signed)
gave patient appointment for 06-2011. printed out calendar and gave to the patient

## 2011-02-16 NOTE — Progress Notes (Signed)
OFFICE PROGRESS NOTE  Mark Perini,MD Glenna Fellows, MD Antony Blackbird, MD   DIAGNOSIS: 76 year old female with 1.5 cm invasive ductal carcinoma of the upper outer quadrant (T1 N0). The tumor was ER +98% PR negative proliferation marker 9% and HER-2/neu negative.  PRIOR THERAPY:  #1 status post lumpectomy on 09/16/2010 with the final pathology revealing a 1.5 cm invasive ductal carcinoma with calcifications, grade 1, perineural invasion was identified. 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 All margins were negative. So 2 sentinel nodes were negative for metastatic disease. The tumor was ER positive between 98 and 99% PR negative with a proliferation marker between 7 and 9% HER-2/neu negative.  #2 patient had Oncotype DX score performed it was 24 putting her in the intermediate risk category with her 5 year just to and recurrence of about 16%  #3 patient was not recommended radiation therapy and so Arimidex was initiated starting on August 11 unfortunately she developed significant side effects from it. And we held it for a little bit. She is now back on it. And is tolerating it well.  CURRENT THERAPY: Arimidex 1 mg by mouth daily  INTERVAL HISTORY: Brianna York 76 y.o. female returns for followup visit today. Overall she seems to be doing well. She is tolerating Arimidex quite well without any problems. She denies any fevers chills night sweats headaches shortness of breath chest pains palpitations no nausea or vomiting no myalgias or arthralgias. She is exercising and eating well. Remainder of the 10 point review of systems is negative.  MEDICAL HISTORY: Past Medical History  Diagnosis Date  . Breast cancer 02/16/2011    ALLERGIES:  is allergic to loratadine-pseudoephedrine; penicillins; and tolterodine tartrate.  MEDICATIONS:  Current Outpatient Prescriptions  Medication Sig Dispense Refill  . alendronate (FOSAMAX) 10 MG tablet Take 10 mg by mouth daily before breakfast. Take  with a full glass of water on an empty stomach.       Marland Kitchen anastrozole (ARIMIDEX) 1 MG tablet Take 1 mg by mouth daily.        Marland Kitchen atorvastatin (LIPITOR) 10 MG tablet Take 10 mg by mouth daily.        . Vitamin D, Ergocalciferol, (DRISDOL) 50000 UNITS CAPS Take 50,000 Units by mouth every 7 (seven) days.          SURGICAL HISTORY: No past surgical history on file.  REVIEW OF SYSTEMS:  Pertinent items are noted in HPI.   PHYSICAL EXAMINATION: General appearance: alert, cooperative and appears stated age Head: Normocephalic, without obvious abnormality, atraumatic Neck: no adenopathy, no carotid bruit, no JVD, supple, symmetrical, trachea midline and thyroid not enlarged, symmetric, no tenderness/mass/nodules Lymph nodes: Cervical, supraclavicular, and axillary nodes normal. Resp: clear to auscultation bilaterally and normal percussion bilaterally Back: symmetric, no curvature. ROM normal. No CVA tenderness. Cardio: regular rate and rhythm, S1, S2 normal, no murmur, click, rub or gallop and normal apical impulse GI: soft, non-tender; bowel sounds normal; no masses,  no organomegaly Extremities: extremities normal, atraumatic, no cyanosis or edema Neurologic: Alert and oriented X 3, normal strength and tone. Normal symmetric reflexes. Normal coordination and gait Bilateral breast examinations are performed. Left breast reveals some thickening to thickening at the surgical site but it is stable. She is also noticing a little bit of heaviness in this breast. But there is no pain no dominant masses. Right breast no masses or nipple discharge or  ECOG PERFORMANCE STATUS: 1 - Symptomatic but completely ambulatory  Blood pressure 121/72, pulse 67, temperature 97.9 F (36.6  C), temperature source Oral, height 4' 10.5" (1.486 m), weight 147 lb 1.6 oz (66.724 kg).  LABORATORY DATA: Lab Results  Component Value Date   WBC 4.5 02/16/2011   HGB 13.6 02/16/2011   HCT 40.3 02/16/2011   MCV 94.6 02/16/2011    PLT 160 02/16/2011      Chemistry      Component Value Date/Time   NA 142 02/16/2011 1057   K 4.4 02/16/2011 1057   CL 106 02/16/2011 1057   CO2 29 02/16/2011 1057   BUN 18 02/16/2011 1057   CREATININE 0.88 02/16/2011 1057      Component Value Date/Time   CALCIUM 9.1 02/16/2011 1057   ALKPHOS 68 02/16/2011 1057   AST 27 02/16/2011 1057   ALT 24 02/16/2011 1057   BILITOT 0.5 02/16/2011 1057       RADIOGRAPHIC STUDIES:  No results found.  ASSESSMENT: 76 year old female with stage I invasive ductal carcinoma of the left breast status post lumpectomy. The tumor was ER positive HER-2/neu negative PR negative favorable proliferation marker T. 67. She overall is doing well and she will continue to be on the Arimidex.   PLAN: Continue Arimidex for 5 years on a daily basis. Followup in 6 months time   All questions were answered. The patient knows to call the clinic with any problems, questions or concerns. We can certainly see the patient much sooner if necessary.  I spent 20 minutes counseling the patient face to face. The total time spent in the appointment was 30 minutes.    Drue Second, MD Medical/Oncology [facility] [phone] (beeper) [phone] (Office)  02/16/2011, 6:39 PM

## 2011-06-22 ENCOUNTER — Encounter: Payer: Self-pay | Admitting: Oncology

## 2011-06-22 ENCOUNTER — Ambulatory Visit (HOSPITAL_BASED_OUTPATIENT_CLINIC_OR_DEPARTMENT_OTHER): Payer: Medicare Other | Admitting: Oncology

## 2011-06-22 ENCOUNTER — Other Ambulatory Visit (HOSPITAL_BASED_OUTPATIENT_CLINIC_OR_DEPARTMENT_OTHER): Payer: Medicare Other | Admitting: Lab

## 2011-06-22 ENCOUNTER — Telehealth: Payer: Self-pay | Admitting: Oncology

## 2011-06-22 VITALS — BP 124/72 | HR 73 | Temp 98.0°F | Ht 58.5 in | Wt 144.9 lb

## 2011-06-22 DIAGNOSIS — E559 Vitamin D deficiency, unspecified: Secondary | ICD-10-CM

## 2011-06-22 DIAGNOSIS — Z170 Estrogen receptor positive status [ER+]: Secondary | ICD-10-CM

## 2011-06-22 DIAGNOSIS — C50919 Malignant neoplasm of unspecified site of unspecified female breast: Secondary | ICD-10-CM | POA: Diagnosis not present

## 2011-06-22 DIAGNOSIS — Z79811 Long term (current) use of aromatase inhibitors: Secondary | ICD-10-CM | POA: Diagnosis not present

## 2011-06-22 LAB — COMPREHENSIVE METABOLIC PANEL
ALT: 17 U/L (ref 0–35)
AST: 19 U/L (ref 0–37)
Albumin: 4.3 g/dL (ref 3.5–5.2)
Alkaline Phosphatase: 58 U/L (ref 39–117)
BUN: 21 mg/dL (ref 6–23)
CO2: 27 mEq/L (ref 19–32)
Calcium: 9.4 mg/dL (ref 8.4–10.5)
Chloride: 105 mEq/L (ref 96–112)
Creatinine, Ser: 0.88 mg/dL (ref 0.50–1.10)
Glucose, Bld: 74 mg/dL (ref 70–99)
Potassium: 4.6 mEq/L (ref 3.5–5.3)
Sodium: 140 mEq/L (ref 135–145)
Total Bilirubin: 0.4 mg/dL (ref 0.3–1.2)
Total Protein: 6.2 g/dL (ref 6.0–8.3)

## 2011-06-22 LAB — CBC WITH DIFFERENTIAL/PLATELET
BASO%: 0.5 % (ref 0.0–2.0)
Basophils Absolute: 0 10*3/uL (ref 0.0–0.1)
EOS%: 3.5 % (ref 0.0–7.0)
Eosinophils Absolute: 0.2 10*3/uL (ref 0.0–0.5)
HCT: 38.9 % (ref 34.8–46.6)
HGB: 13 g/dL (ref 11.6–15.9)
LYMPH%: 31.8 % (ref 14.0–49.7)
MCH: 31 pg (ref 25.1–34.0)
MCHC: 33.5 g/dL (ref 31.5–36.0)
MCV: 92.5 fL (ref 79.5–101.0)
MONO#: 0.6 10*3/uL (ref 0.1–0.9)
MONO%: 10.3 % (ref 0.0–14.0)
NEUT#: 3 10*3/uL (ref 1.5–6.5)
NEUT%: 53.9 % (ref 38.4–76.8)
Platelets: 178 10*3/uL (ref 145–400)
RBC: 4.2 10*6/uL (ref 3.70–5.45)
RDW: 12.9 % (ref 11.2–14.5)
WBC: 5.6 10*3/uL (ref 3.9–10.3)
lymph#: 1.8 10*3/uL (ref 0.9–3.3)
nRBC: 0 % (ref 0–0)

## 2011-06-22 MED ORDER — ANASTROZOLE 1 MG PO TABS: 1.0000 mg | ORAL_TABLET | Freq: Every day | ORAL | Status: DC

## 2011-06-22 NOTE — Patient Instructions (Signed)
1. You are doing well.  Continue taking the arimidex  2. Your refills for the arimidex was sent to you pharmacy  3. I will see you back in 6 months (November)  4. Please call with any questions and problems

## 2011-06-22 NOTE — Progress Notes (Signed)
OFFICE PROGRESS NOTE  Mark Perini,MD Glenna Fellows, MD Antony Blackbird, MD   DIAGNOSIS: 77 year old female with 1.5 cm invasive ductal carcinoma of the upper outer quadrant (T1 N0). The tumor was ER +98% PR negative proliferation marker 9% and HER-2/neu negative.  PRIOR THERAPY:  #1 status post lumpectomy on 09/16/2010 with the final pathology revealing a 1.5 cm invasive ductal carcinoma with calcifications, grade 1, perineural invasion was identified. 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 All margins were negative. So 2 sentinel nodes were negative for metastatic disease. The tumor was ER positive between 98 and 99% PR negative with a proliferation marker between 7 and 9% HER-2/neu negative.  #2 patient had Oncotype DX score performed it was 24 putting her in the intermediate risk category with her 5 year just to and recurrence of about 16%  #3 patient was not recommended radiation therapy and so Arimidex was initiated starting on August 11 unfortunately she developed significant side effects from it. And we held it for a little bit. She is now back on it. And is tolerating it well.  CURRENT THERAPY: Arimidex 1 mg by mouth daily  INTERVAL HISTORY: Brianna York 77 y.o. female returns for followup visit today. Overall she seems to be doing well. She is tolerating Arimidex quite well without any problems. She denies any fevers chills night sweats headaches shortness of breath chest pains palpitations no nausea or vomiting no myalgias or arthralgias. She is exercising and eating well. Remainder of the 10 point review of systems is negative.  MEDICAL HISTORY: Past Medical History  Diagnosis Date  . Breast cancer 02/16/2011    ALLERGIES:  is allergic to loratadine-pseudoephedrine; penicillins; and tolterodine tartrate.  MEDICATIONS:  Current Outpatient Prescriptions  Medication Sig Dispense Refill  . alendronate (FOSAMAX) 10 MG tablet Take 10 mg by mouth daily before breakfast. Take  with a full glass of water on an empty stomach.       Marland Kitchen anastrozole (ARIMIDEX) 1 MG tablet Take 1 mg by mouth daily.        Marland Kitchen atorvastatin (LIPITOR) 10 MG tablet Take 10 mg by mouth daily.        . Vitamin D, Ergocalciferol, (DRISDOL) 50000 UNITS CAPS Take 50,000 Units by mouth every 7 (seven) days.          SURGICAL HISTORY: History reviewed. No pertinent past surgical history.  REVIEW OF SYSTEMS:  Pertinent items are noted in HPI.   PHYSICAL EXAMINATION: General appearance: alert, cooperative and appears stated age Head: Normocephalic, without obvious abnormality, atraumatic Neck: no adenopathy, no carotid bruit, no JVD, supple, symmetrical, trachea midline and thyroid not enlarged, symmetric, no tenderness/mass/nodules Lymph nodes: Cervical, supraclavicular, and axillary nodes normal. Resp: clear to auscultation bilaterally and normal percussion bilaterally Back: symmetric, no curvature. ROM normal. No CVA tenderness. Cardio: regular rate and rhythm, S1, S2 normal, no murmur, click, rub or gallop and normal apical impulse GI: soft, non-tender; bowel sounds normal; no masses,  no organomegaly Extremities: extremities normal, atraumatic, no cyanosis or edema Neurologic: Alert and oriented X 3, normal strength and tone. Normal symmetric reflexes. Normal coordination and gait Bilateral breast examinations are performed. Left breast reveals some thickening to thickening at the surgical site but it is stable. She is also noticing a little bit of heaviness in this breast. But there is no pain no dominant masses. Right breast no masses or nipple discharge or  ECOG PERFORMANCE STATUS: 1 - Symptomatic but completely ambulatory  Blood pressure 124/72, pulse 73, temperature 98 F (  36.7 C), temperature source Oral, height 4' 10.5" (1.486 m), weight 144 lb 14.4 oz (65.726 kg).  LABORATORY DATA: Lab Results  Component Value Date   WBC 5.6 06/22/2011   HGB 13.0 06/22/2011   HCT 38.9 06/22/2011   MCV  92.5 06/22/2011   PLT 178 06/22/2011      Chemistry      Component Value Date/Time   NA 142 02/16/2011 1057   K 4.4 02/16/2011 1057   CL 106 02/16/2011 1057   CO2 29 02/16/2011 1057   BUN 18 02/16/2011 1057   CREATININE 0.88 02/16/2011 1057      Component Value Date/Time   CALCIUM 9.1 02/16/2011 1057   ALKPHOS 68 02/16/2011 1057   AST 27 02/16/2011 1057   ALT 24 02/16/2011 1057   BILITOT 0.5 02/16/2011 1057       RADIOGRAPHIC STUDIES:  No results found.  ASSESSMENT: 77 year old female with stage I invasive ductal carcinoma of the left breast status post lumpectomy. The tumor was ER positive HER-2/neu negative PR negative favorable proliferation marker Ki-67. She overall is doing well and she will continue to be on the Arimidex.   PLAN: Continue Arimidex for 5 years on a daily basis. Followup in 6 months time   All questions were answered. The patient knows to call the clinic with any problems, questions or concerns. We can certainly see the patient much sooner if necessary.  I spent 20 minutes counseling the patient face to face. The total time spent in the appointment was 30 minutes.    Drue Second, MD Medical/Oncology [facility] [phone] (beeper) [phone] (Office)  06/22/2011, 11:36 AM

## 2011-06-22 NOTE — Telephone Encounter (Signed)
gve the pt her nov 2013 appt calendar °

## 2011-07-03 ENCOUNTER — Other Ambulatory Visit: Payer: Self-pay

## 2011-07-03 DIAGNOSIS — C50919 Malignant neoplasm of unspecified site of unspecified female breast: Secondary | ICD-10-CM

## 2011-07-03 MED ORDER — ANASTROZOLE 1 MG PO TABS: 1.0000 mg | ORAL_TABLET | Freq: Every day | ORAL | Status: DC

## 2011-07-06 DIAGNOSIS — C50919 Malignant neoplasm of unspecified site of unspecified female breast: Secondary | ICD-10-CM | POA: Diagnosis not present

## 2011-07-06 DIAGNOSIS — N3090 Cystitis, unspecified without hematuria: Secondary | ICD-10-CM | POA: Diagnosis not present

## 2011-07-06 DIAGNOSIS — M48061 Spinal stenosis, lumbar region without neurogenic claudication: Secondary | ICD-10-CM | POA: Diagnosis not present

## 2011-07-06 DIAGNOSIS — M549 Dorsalgia, unspecified: Secondary | ICD-10-CM | POA: Diagnosis not present

## 2011-07-07 ENCOUNTER — Other Ambulatory Visit: Payer: Self-pay | Admitting: Medical Oncology

## 2011-07-07 MED ORDER — ANASTROZOLE 1 MG PO TABS: 1.0000 mg | ORAL_TABLET | Freq: Every day | ORAL | Status: AC

## 2011-07-09 NOTE — Telephone Encounter (Signed)
ANASTROZOLE REFILL WAS FAXED TO [facility]ICH IS THE APPROPRIATE [facility].

## 2011-07-27 ENCOUNTER — Other Ambulatory Visit: Payer: Self-pay | Admitting: Internal Medicine

## 2011-07-27 DIAGNOSIS — Z853 Personal history of malignant neoplasm of breast: Secondary | ICD-10-CM

## 2011-08-24 ENCOUNTER — Ambulatory Visit
Admission: RE | Admit: 2011-08-24 | Discharge: 2011-08-24 | Disposition: A | Discharge status: Home / Self Care | Payer: Medicare Other | Source: Ambulatory Visit | Attending: Internal Medicine | Admitting: Internal Medicine

## 2011-08-24 DIAGNOSIS — Z853 Personal history of malignant neoplasm of breast: Secondary | ICD-10-CM

## 2012-01-29 ENCOUNTER — Telehealth: Payer: Self-pay | Admitting: *Deleted

## 2012-01-29 ENCOUNTER — Ambulatory Visit (HOSPITAL_BASED_OUTPATIENT_CLINIC_OR_DEPARTMENT_OTHER): Payer: Medicare Other | Admitting: Oncology

## 2012-01-29 ENCOUNTER — Encounter: Payer: Self-pay | Admitting: Oncology

## 2012-01-29 ENCOUNTER — Other Ambulatory Visit (HOSPITAL_BASED_OUTPATIENT_CLINIC_OR_DEPARTMENT_OTHER): Payer: Medicare Other | Admitting: Lab

## 2012-01-29 VITALS — BP 113/65 | HR 71 | Temp 98.2°F | Resp 20 | Ht 58.5 in | Wt 144.3 lb

## 2012-01-29 DIAGNOSIS — C50419 Malignant neoplasm of upper-outer quadrant of unspecified female breast: Secondary | ICD-10-CM | POA: Diagnosis not present

## 2012-01-29 DIAGNOSIS — E559 Vitamin D deficiency, unspecified: Secondary | ICD-10-CM

## 2012-01-29 DIAGNOSIS — C50919 Malignant neoplasm of unspecified site of unspecified female breast: Secondary | ICD-10-CM

## 2012-01-29 DIAGNOSIS — M25519 Pain in unspecified shoulder: Secondary | ICD-10-CM | POA: Diagnosis not present

## 2012-01-29 DIAGNOSIS — Z170 Estrogen receptor positive status [ER+]: Secondary | ICD-10-CM

## 2012-01-29 LAB — CBC WITH DIFFERENTIAL/PLATELET
BASO%: 0.4 % (ref 0.0–2.0)
Basophils Absolute: 0 10*3/uL (ref 0.0–0.1)
EOS%: 3 % (ref 0.0–7.0)
Eosinophils Absolute: 0.2 10*3/uL (ref 0.0–0.5)
HCT: 40.1 % (ref 34.8–46.6)
HGB: 13.5 g/dL (ref 11.6–15.9)
LYMPH%: 30.8 % (ref 14.0–49.7)
MCH: 31.1 pg (ref 25.1–34.0)
MCHC: 33.7 g/dL (ref 31.5–36.0)
MCV: 92.4 fL (ref 79.5–101.0)
MONO#: 0.5 10*3/uL (ref 0.1–0.9)
MONO%: 10.6 % (ref 0.0–14.0)
NEUT#: 2.8 10*3/uL (ref 1.5–6.5)
NEUT%: 55.2 % (ref 38.4–76.8)
Platelets: 192 10*3/uL (ref 145–400)
RBC: 4.34 10*6/uL (ref 3.70–5.45)
RDW: 13 % (ref 11.2–14.5)
WBC: 5 10*3/uL (ref 3.9–10.3)
lymph#: 1.5 10*3/uL (ref 0.9–3.3)

## 2012-01-29 LAB — COMPREHENSIVE METABOLIC PANEL (CC13)
ALT: 22 U/L (ref 0–55)
AST: 22 U/L (ref 5–34)
Albumin: 4.1 g/dL (ref 3.5–5.0)
Alkaline Phosphatase: 62 U/L (ref 40–150)
BUN: 19 mg/dL (ref 7.0–26.0)
CO2: 27 mEq/L (ref 22–29)
Calcium: 9.5 mg/dL (ref 8.4–10.4)
Chloride: 107 mEq/L (ref 98–107)
Creatinine: 0.9 mg/dL (ref 0.6–1.1)
Glucose: 77 mg/dl (ref 70–99)
Potassium: 4.7 mEq/L (ref 3.5–5.1)
Sodium: 140 mEq/L (ref 136–145)
Total Bilirubin: 0.49 mg/dL (ref 0.20–1.20)
Total Protein: 6.4 g/dL (ref 6.4–8.3)

## 2012-01-29 NOTE — Progress Notes (Signed)
OFFICE PROGRESS NOTE  Mark Perini,MD Glenna Fellows, MD Antony Blackbird, MD   DIAGNOSIS: 77 year old female with 1.5 cm invasive ductal carcinoma of the upper outer quadrant (T1 N0). The tumor was ER +98% PR negative proliferation marker 9% and HER-2/neu negative.  PRIOR THERAPY:  #1 status post lumpectomy on 09/16/2010 with the final pathology revealing a 1.5 cm invasive ductal carcinoma with calcifications, grade 1, perineural invasion was identified. 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 All margins were negative. So 2 sentinel nodes were negative for metastatic disease. The tumor was ER positive between 98 and 99% PR negative with a proliferation marker between 7 and 9% HER-2/neu negative.  #2 patient had Oncotype DX score performed it was 24 putting her in the intermediate risk category with her 5 year just to and recurrence of about 16%  #3 patient was not recommended radiation therapy and so Arimidex was initiated starting on August 11 unfortunately she developed significant side effects from it. And we held it for a little bit. She is now back on it. And is tolerating it well.  CURRENT THERAPY: Arimidex 1 mg by mouth daily  INTERVAL HISTORY: Brianna York 77 y.o. female returns for followup visit today. Overall she seems to be doing well. She is tolerating Arimidex quite well without any problems. She denies any fevers chills night sweats headaches shortness of breath chest pains palpitations no nausea or vomiting no myalgias or arthralgias. She is exercising and eating well. Remainder of the 10 point review of systems is negative.  MEDICAL HISTORY: Past Medical History  Diagnosis Date  . Breast cancer 02/16/2011    ALLERGIES:  is allergic to loratadine-pseudoephedrine er; penicillins; and tolterodine tartrate.  MEDICATIONS:  Current Outpatient Prescriptions  Medication Sig Dispense Refill  . alendronate (FOSAMAX) 10 MG tablet Take 10 mg by mouth daily before breakfast.  Take with a full glass of water on an empty stomach.       Marland Kitchen anastrozole (ARIMIDEX) 1 MG tablet Take 1 mg by mouth daily.      Marland Kitchen atorvastatin (LIPITOR) 10 MG tablet Take 10 mg by mouth daily.        . Vitamin D, Ergocalciferol, (DRISDOL) 50000 UNITS CAPS Take 50,000 Units by mouth every 7 (seven) days.          SURGICAL HISTORY: No past surgical history on file.  REVIEW OF SYSTEMS:  Pertinent items are noted in HPI.   PHYSICAL EXAMINATION: General appearance: alert, cooperative and appears stated age Head: Normocephalic, without obvious abnormality, atraumatic Neck: no adenopathy, no carotid bruit, no JVD, supple, symmetrical, trachea midline and thyroid not enlarged, symmetric, no tenderness/mass/nodules Lymph nodes: Cervical, supraclavicular, and axillary nodes normal. Resp: clear to auscultation bilaterally and normal percussion bilaterally Back: symmetric, no curvature. ROM normal. No CVA tenderness. Cardio: regular rate and rhythm, S1, S2 normal, no murmur, click, rub or gallop and normal apical impulse GI: soft, non-tender; bowel sounds normal; no masses,  no organomegaly Extremities: extremities normal, atraumatic, no cyanosis or edema Neurologic: Alert and oriented X 3, normal strength and tone. Normal symmetric reflexes. Normal coordination and gait Bilateral breast examinations are performed. Left breast reveals some thickening to thickening at the surgical site but it is stable. She is also noticing a little bit of heaviness in this breast. But there is no pain no dominant masses. Right breast no masses or nipple discharge or  ECOG PERFORMANCE STATUS: 1 - Symptomatic but completely ambulatory  Blood pressure 113/65, pulse 71, temperature 98.2 F (36.8 C),  temperature source Oral, resp. rate 20, height 4' 10.5" (1.486 m), weight 144 lb 4.8 oz (65.454 kg).  LABORATORY DATA: Lab Results  Component Value Date   WBC 5.0 01/29/2012   HGB 13.5 01/29/2012   HCT 40.1 01/29/2012    MCV 92.4 01/29/2012   PLT 192 01/29/2012      Chemistry      Component Value Date/Time   NA 140 06/22/2011 1035   K 4.6 06/22/2011 1035   CL 105 06/22/2011 1035   CO2 27 06/22/2011 1035   BUN 21 06/22/2011 1035   CREATININE 0.88 06/22/2011 1035      Component Value Date/Time   CALCIUM 9.4 06/22/2011 1035   ALKPHOS 58 06/22/2011 1035   AST 19 06/22/2011 1035   ALT 17 06/22/2011 1035   BILITOT 0.4 06/22/2011 1035       RADIOGRAPHIC STUDIES:  No results found.  ASSESSMENT: 77 year old female with stage I invasive ductal carcinoma of the left breast status post lumpectomy. The tumor was ER positive HER-2/neu negative PR negative favorable proliferation marker Ki-67. She overall is doing well and she will continue to be on the Arimidex. Patient today is complaining of left shoulder pain she thinks she may have pinched nerve what to my exam it feels like a muscle spasm.   PLAN: Continue Arimidex for 5 years on a daily basis. Followup in 6 months time. For her muscle spasm she will take a muscle relaxant and I have also recommended she take some ibuprofen or Aleve. If it does not get better in the next few days then she is to go see her primary doctor.  All questions were answered. The patient knows to call the clinic with any problems, questions or concerns. We can certainly see the patient much sooner if necessary.  I spent 20 minutes counseling the patient face to face. The total time spent in the appointment was 30 minutes.    Drue Second, MD Medical/Oncology [facility] [phone] (beeper) [phone] (Office)  01/29/2012, 11:04 AM

## 2012-01-29 NOTE — Patient Instructions (Addendum)
Continue arimidex  We will see you back in 6 months  Lab Results  Component Value Date   WBC 5.0 01/29/2012   HGB 13.5 01/29/2012   HCT 40.1 01/29/2012   MCV 92.4 01/29/2012   PLT 192 01/29/2012

## 2012-01-29 NOTE — Telephone Encounter (Signed)
Gave patient appointment for 07-2012 

## 2012-01-30 LAB — VITAMIN D 25 HYDROXY (VIT D DEFICIENCY, FRACTURES): Vit D, 25-Hydroxy: 56 ng/mL (ref 30–89)

## 2012-03-01 DIAGNOSIS — Z23 Encounter for immunization: Secondary | ICD-10-CM | POA: Diagnosis not present

## 2012-03-21 DIAGNOSIS — E559 Vitamin D deficiency, unspecified: Secondary | ICD-10-CM | POA: Diagnosis not present

## 2012-03-21 DIAGNOSIS — R82998 Other abnormal findings in urine: Secondary | ICD-10-CM | POA: Diagnosis not present

## 2012-03-21 DIAGNOSIS — M949 Disorder of cartilage, unspecified: Secondary | ICD-10-CM | POA: Diagnosis not present

## 2012-03-21 DIAGNOSIS — E785 Hyperlipidemia, unspecified: Secondary | ICD-10-CM | POA: Diagnosis not present

## 2012-03-21 DIAGNOSIS — M899 Disorder of bone, unspecified: Secondary | ICD-10-CM | POA: Diagnosis not present

## 2012-03-28 DIAGNOSIS — Z124 Encounter for screening for malignant neoplasm of cervix: Secondary | ICD-10-CM | POA: Diagnosis not present

## 2012-03-28 DIAGNOSIS — N3090 Cystitis, unspecified without hematuria: Secondary | ICD-10-CM | POA: Diagnosis not present

## 2012-03-28 DIAGNOSIS — E785 Hyperlipidemia, unspecified: Secondary | ICD-10-CM | POA: Diagnosis not present

## 2012-03-28 DIAGNOSIS — Z0000 Encounter for general adult medical examination without abnormal findings: Secondary | ICD-10-CM | POA: Diagnosis not present

## 2012-04-04 DIAGNOSIS — Z1212 Encounter for screening for malignant neoplasm of rectum: Secondary | ICD-10-CM | POA: Diagnosis not present

## 2012-04-06 DIAGNOSIS — M999 Biomechanical lesion, unspecified: Secondary | ICD-10-CM | POA: Diagnosis not present

## 2012-04-06 DIAGNOSIS — M5430 Sciatica, unspecified side: Secondary | ICD-10-CM | POA: Diagnosis not present

## 2012-04-06 DIAGNOSIS — M9981 Other biomechanical lesions of cervical region: Secondary | ICD-10-CM | POA: Diagnosis not present

## 2012-04-07 DIAGNOSIS — M999 Biomechanical lesion, unspecified: Secondary | ICD-10-CM | POA: Diagnosis not present

## 2012-04-07 DIAGNOSIS — M5430 Sciatica, unspecified side: Secondary | ICD-10-CM | POA: Diagnosis not present

## 2012-04-07 DIAGNOSIS — M9981 Other biomechanical lesions of cervical region: Secondary | ICD-10-CM | POA: Diagnosis not present

## 2012-04-11 DIAGNOSIS — M9981 Other biomechanical lesions of cervical region: Secondary | ICD-10-CM | POA: Diagnosis not present

## 2012-04-11 DIAGNOSIS — M5430 Sciatica, unspecified side: Secondary | ICD-10-CM | POA: Diagnosis not present

## 2012-04-11 DIAGNOSIS — M999 Biomechanical lesion, unspecified: Secondary | ICD-10-CM | POA: Diagnosis not present

## 2012-04-12 DIAGNOSIS — M9981 Other biomechanical lesions of cervical region: Secondary | ICD-10-CM | POA: Diagnosis not present

## 2012-04-12 DIAGNOSIS — M5430 Sciatica, unspecified side: Secondary | ICD-10-CM | POA: Diagnosis not present

## 2012-04-12 DIAGNOSIS — M999 Biomechanical lesion, unspecified: Secondary | ICD-10-CM | POA: Diagnosis not present

## 2012-04-18 DIAGNOSIS — M9981 Other biomechanical lesions of cervical region: Secondary | ICD-10-CM | POA: Diagnosis not present

## 2012-04-18 DIAGNOSIS — M999 Biomechanical lesion, unspecified: Secondary | ICD-10-CM | POA: Diagnosis not present

## 2012-04-18 DIAGNOSIS — M5430 Sciatica, unspecified side: Secondary | ICD-10-CM | POA: Diagnosis not present

## 2012-04-20 DIAGNOSIS — M9981 Other biomechanical lesions of cervical region: Secondary | ICD-10-CM | POA: Diagnosis not present

## 2012-04-20 DIAGNOSIS — M999 Biomechanical lesion, unspecified: Secondary | ICD-10-CM | POA: Diagnosis not present

## 2012-04-20 DIAGNOSIS — M5430 Sciatica, unspecified side: Secondary | ICD-10-CM | POA: Diagnosis not present

## 2012-04-21 DIAGNOSIS — M9981 Other biomechanical lesions of cervical region: Secondary | ICD-10-CM | POA: Diagnosis not present

## 2012-04-21 DIAGNOSIS — M5430 Sciatica, unspecified side: Secondary | ICD-10-CM | POA: Diagnosis not present

## 2012-04-21 DIAGNOSIS — M999 Biomechanical lesion, unspecified: Secondary | ICD-10-CM | POA: Diagnosis not present

## 2012-04-25 DIAGNOSIS — M999 Biomechanical lesion, unspecified: Secondary | ICD-10-CM | POA: Diagnosis not present

## 2012-04-25 DIAGNOSIS — M9981 Other biomechanical lesions of cervical region: Secondary | ICD-10-CM | POA: Diagnosis not present

## 2012-04-25 DIAGNOSIS — M5430 Sciatica, unspecified side: Secondary | ICD-10-CM | POA: Diagnosis not present

## 2012-04-26 DIAGNOSIS — M5430 Sciatica, unspecified side: Secondary | ICD-10-CM | POA: Diagnosis not present

## 2012-04-26 DIAGNOSIS — M9981 Other biomechanical lesions of cervical region: Secondary | ICD-10-CM | POA: Diagnosis not present

## 2012-04-26 DIAGNOSIS — M999 Biomechanical lesion, unspecified: Secondary | ICD-10-CM | POA: Diagnosis not present

## 2012-05-02 DIAGNOSIS — M999 Biomechanical lesion, unspecified: Secondary | ICD-10-CM | POA: Diagnosis not present

## 2012-05-02 DIAGNOSIS — M9981 Other biomechanical lesions of cervical region: Secondary | ICD-10-CM | POA: Diagnosis not present

## 2012-05-02 DIAGNOSIS — M5430 Sciatica, unspecified side: Secondary | ICD-10-CM | POA: Diagnosis not present

## 2012-05-04 DIAGNOSIS — M9981 Other biomechanical lesions of cervical region: Secondary | ICD-10-CM | POA: Diagnosis not present

## 2012-05-04 DIAGNOSIS — M5430 Sciatica, unspecified side: Secondary | ICD-10-CM | POA: Diagnosis not present

## 2012-05-04 DIAGNOSIS — M999 Biomechanical lesion, unspecified: Secondary | ICD-10-CM | POA: Diagnosis not present

## 2012-06-02 DIAGNOSIS — H43399 Other vitreous opacities, unspecified eye: Secondary | ICD-10-CM | POA: Diagnosis not present

## 2012-06-02 DIAGNOSIS — Z961 Presence of intraocular lens: Secondary | ICD-10-CM | POA: Diagnosis not present

## 2012-06-02 DIAGNOSIS — H04129 Dry eye syndrome of unspecified lacrimal gland: Secondary | ICD-10-CM | POA: Diagnosis not present

## 2012-06-09 DIAGNOSIS — J40 Bronchitis, not specified as acute or chronic: Secondary | ICD-10-CM | POA: Diagnosis not present

## 2012-07-08 ENCOUNTER — Other Ambulatory Visit: Payer: Self-pay | Admitting: *Deleted

## 2012-07-08 DIAGNOSIS — C50919 Malignant neoplasm of unspecified site of unspecified female breast: Secondary | ICD-10-CM

## 2012-07-08 MED ORDER — ANASTROZOLE 1 MG PO TABS: 1.0000 mg | ORAL_TABLET | Freq: Every day | ORAL | Status: DC

## 2012-07-12 ENCOUNTER — Other Ambulatory Visit: Payer: Self-pay | Admitting: Medical Oncology

## 2012-07-12 DIAGNOSIS — C50919 Malignant neoplasm of unspecified site of unspecified female breast: Secondary | ICD-10-CM

## 2012-07-13 ENCOUNTER — Encounter: Payer: Self-pay | Admitting: Gastroenterology

## 2012-07-26 ENCOUNTER — Telehealth: Payer: Self-pay | Admitting: Oncology

## 2012-07-28 ENCOUNTER — Other Ambulatory Visit: Payer: Medicare Other | Admitting: Lab

## 2012-07-28 ENCOUNTER — Ambulatory Visit: Payer: Medicare Other | Admitting: Adult Health

## 2012-08-10 ENCOUNTER — Other Ambulatory Visit: Payer: Self-pay | Admitting: Emergency Medicine

## 2012-08-10 ENCOUNTER — Other Ambulatory Visit (HOSPITAL_BASED_OUTPATIENT_CLINIC_OR_DEPARTMENT_OTHER): Payer: Medicare Other | Admitting: Lab

## 2012-08-10 ENCOUNTER — Ambulatory Visit (HOSPITAL_BASED_OUTPATIENT_CLINIC_OR_DEPARTMENT_OTHER): Payer: Medicare Other | Admitting: Oncology

## 2012-08-10 VITALS — BP 122/72 | HR 85 | Temp 98.6°F | Resp 20 | Ht 58.5 in | Wt 140.7 lb

## 2012-08-10 DIAGNOSIS — C50419 Malignant neoplasm of upper-outer quadrant of unspecified female breast: Secondary | ICD-10-CM | POA: Diagnosis not present

## 2012-08-10 DIAGNOSIS — Z170 Estrogen receptor positive status [ER+]: Secondary | ICD-10-CM | POA: Diagnosis not present

## 2012-08-10 DIAGNOSIS — C50919 Malignant neoplasm of unspecified site of unspecified female breast: Secondary | ICD-10-CM

## 2012-08-10 DIAGNOSIS — M25519 Pain in unspecified shoulder: Secondary | ICD-10-CM

## 2012-08-10 DIAGNOSIS — C50912 Malignant neoplasm of unspecified site of left female breast: Secondary | ICD-10-CM

## 2012-08-10 LAB — CBC WITH DIFFERENTIAL/PLATELET
BASO%: 0.7 % (ref 0.0–2.0)
Basophils Absolute: 0 10*3/uL (ref 0.0–0.1)
EOS%: 2.6 % (ref 0.0–7.0)
Eosinophils Absolute: 0.1 10*3/uL (ref 0.0–0.5)
HCT: 38.8 % (ref 34.8–46.6)
HGB: 13.3 g/dL (ref 11.6–15.9)
LYMPH%: 34.1 % (ref 14.0–49.7)
MCH: 31.4 pg (ref 25.1–34.0)
MCHC: 34.2 g/dL (ref 31.5–36.0)
MCV: 91.7 fL (ref 79.5–101.0)
MONO#: 0.5 10*3/uL (ref 0.1–0.9)
MONO%: 9.2 % (ref 0.0–14.0)
NEUT#: 3 10*3/uL (ref 1.5–6.5)
NEUT%: 53.4 % (ref 38.4–76.8)
Platelets: 166 10*3/uL (ref 145–400)
RBC: 4.24 10*6/uL (ref 3.70–5.45)
RDW: 13.1 % (ref 11.2–14.5)
WBC: 5.6 10*3/uL (ref 3.9–10.3)
lymph#: 1.9 10*3/uL (ref 0.9–3.3)

## 2012-08-10 LAB — COMPREHENSIVE METABOLIC PANEL (CC13)
ALT: 21 U/L (ref 0–55)
AST: 22 U/L (ref 5–34)
Albumin: 3.6 g/dL (ref 3.5–5.0)
Alkaline Phosphatase: 57 U/L (ref 40–150)
BUN: 13.8 mg/dL (ref 7.0–26.0)
CO2: 24 mEq/L (ref 22–29)
Calcium: 9 mg/dL (ref 8.4–10.4)
Chloride: 109 mEq/L — ABNORMAL HIGH (ref 98–107)
Creatinine: 0.9 mg/dL (ref 0.6–1.1)
Glucose: 139 mg/dl — ABNORMAL HIGH (ref 70–99)
Potassium: 3.8 mEq/L (ref 3.5–5.1)
Sodium: 142 mEq/L (ref 136–145)
Total Bilirubin: 0.51 mg/dL (ref 0.20–1.20)
Total Protein: 6.3 g/dL — ABNORMAL LOW (ref 6.4–8.3)

## 2012-08-10 MED ORDER — ANASTROZOLE 1 MG PO TABS: 1.0000 mg | ORAL_TABLET | Freq: Every day | ORAL | Status: DC

## 2012-08-10 MED ORDER — ANASTROZOLE 1 MG PO TABS: 1.0000 mg | ORAL_TABLET | Freq: Every day | ORAL | Status: AC

## 2012-08-10 NOTE — Patient Instructions (Addendum)
Doing well no evidence of recurrent cancer  Please schedule a bone density and mammogram   arimidex prescription sent [facility]  I will see you back in 6 months

## 2012-09-04 NOTE — Progress Notes (Signed)
OFFICE PROGRESS NOTE  Mark Perini,MD Glenna Fellows, MD Antony Blackbird, MD   DIAGNOSIS: 78 year old female with 1.5 cm invasive ductal carcinoma of the upper outer quadrant (T1 N0). The tumor was ER +98% PR negative proliferation marker 9% and HER-2/neu negative.  PRIOR THERAPY:  #1 status post lumpectomy on 09/16/2010 with the final pathology revealing a 1.5 cm invasive ductal carcinoma with calcifications, grade 1, perineural invasion was identified. 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 All margins were negative. So 2 sentinel nodes were negative for metastatic disease. The tumor was ER positive between 98 and 99% PR negative with a proliferation marker between 7 and 9% HER-2/neu negative.  #2 patient had Oncotype DX score performed it was 24 putting her in the intermediate risk category with her 5 year just to and recurrence of about 16%  #3 patient was not recommended radiation therapy and so Arimidex was initiated starting on August 11 unfortunately she developed significant side effects from it. And we held it for a little bit. She is now back on it. And is tolerating it well.  CURRENT THERAPY: Arimidex 1 mg by mouth daily  INTERVAL HISTORY: Brianna York 78 y.o. female returns for followup visit today. Overall she seems to be doing well. She is tolerating Arimidex quite well without any problems. She denies any fevers chills night sweats headaches shortness of breath chest pains palpitations no nausea or vomiting no myalgias or arthralgias. She is exercising and eating well. Remainder of the 10 point review of systems is negative.  MEDICAL HISTORY: Past Medical History  Diagnosis Date  . Breast cancer 02/16/2011    ALLERGIES:  is allergic to loratadine-pseudoephedrine er; penicillins; and tolterodine tartrate.  MEDICATIONS:  Current Outpatient Prescriptions  Medication Sig Dispense Refill  . alendronate (FOSAMAX) 10 MG tablet Take 10 mg by mouth daily before breakfast.  Take with a full glass of water on an empty stomach.       Marland Kitchen atorvastatin (LIPITOR) 10 MG tablet Take 10 mg by mouth daily.        . Vitamin D, Ergocalciferol, (DRISDOL) 50000 UNITS CAPS Take 50,000 Units by mouth every 7 (seven) days.        Marland Kitchen anastrozole (ARIMIDEX) 1 MG tablet Take 1 tablet (1 mg total) by mouth daily.  90 tablet  12   No current facility-administered medications for this visit.    SURGICAL HISTORY: No past surgical history on file.  REVIEW OF SYSTEMS:  Pertinent items are noted in HPI.   PHYSICAL EXAMINATION: General appearance: alert, cooperative and appears stated age Head: Normocephalic, without obvious abnormality, atraumatic Neck: no adenopathy, no carotid bruit, no JVD, supple, symmetrical, trachea midline and thyroid not enlarged, symmetric, no tenderness/mass/nodules Lymph nodes: Cervical, supraclavicular, and axillary nodes normal. Resp: clear to auscultation bilaterally and normal percussion bilaterally Back: symmetric, no curvature. ROM normal. No CVA tenderness. Cardio: regular rate and rhythm, S1, S2 normal, no murmur, click, rub or gallop and normal apical impulse GI: soft, non-tender; bowel sounds normal; no masses,  no organomegaly Extremities: extremities normal, atraumatic, no cyanosis or edema Neurologic: Alert and oriented X 3, normal strength and tone. Normal symmetric reflexes. Normal coordination and gait Bilateral breast examinations are performed. Left breast reveals some thickening to thickening at the surgical site but it is stable. She is also noticing a little bit of heaviness in this breast. But there is no pain no dominant masses. Right breast no masses or nipple discharge or  ECOG PERFORMANCE STATUS: 1 - Symptomatic but  completely ambulatory  Blood pressure 122/72, pulse 85, temperature 98.6 F (37 C), temperature source Oral, resp. rate 20, height 4' 10.5" (1.486 m), weight 140 lb 11.2 oz (63.821 kg).  LABORATORY DATA: Lab Results   Component Value Date   WBC 5.6 08/10/2012   HGB 13.3 08/10/2012   HCT 38.8 08/10/2012   MCV 91.7 08/10/2012   PLT 166 08/10/2012      Chemistry      Component Value Date/Time   NA 142 08/10/2012 1426   NA 140 06/22/2011 1035   K 3.8 08/10/2012 1426   K 4.6 06/22/2011 1035   CL 109* 08/10/2012 1426   CL 105 06/22/2011 1035   CO2 24 08/10/2012 1426   CO2 27 06/22/2011 1035   BUN 13.8 08/10/2012 1426   BUN 21 06/22/2011 1035   CREATININE 0.9 08/10/2012 1426   CREATININE 0.88 06/22/2011 1035      Component Value Date/Time   CALCIUM 9.0 08/10/2012 1426   CALCIUM 9.4 06/22/2011 1035   ALKPHOS 57 08/10/2012 1426   ALKPHOS 58 06/22/2011 1035   AST 22 08/10/2012 1426   AST 19 06/22/2011 1035   ALT 21 08/10/2012 1426   ALT 17 06/22/2011 1035   BILITOT 0.51 08/10/2012 1426   BILITOT 0.4 06/22/2011 1035       RADIOGRAPHIC STUDIES:  No results found.  ASSESSMENT: 78 year old female with   #1stage I invasive ductal carcinoma of the left breast status post lumpectomy. The tumor was ER positive HER-2/neu negative PR negative favorable proliferation marker Ki-67. She overall is doing well and she will continue to be on the Arimidex. Patient today is complaining of left shoulder pain she thinks she may have pinched nerve what to my exam it feels like a muscle spasm.  #2 she is requiring bone density and mammogram  PLAN:   #1 overall patient is doing well she has no evidence of recurrent disease.  #2 she will schedule her bone density and mammogram.  #3 [facility] prescription [facility].  #4 I will see her back in 6 months time.  All questions were answered. The patient knows to call the clinic with any problems, questions or concerns. We can certainly see the patient much sooner if necessary.  I spent 25 minutes counseling the patient face to face. The total time spent in the appointment was 30 minutes.    Drue Second, MD Medical/Oncology [facility] [phone] (beeper) [phone] (Office)

## 2012-11-07 ENCOUNTER — Other Ambulatory Visit: Payer: Self-pay | Admitting: Internal Medicine

## 2012-11-07 ENCOUNTER — Encounter: Payer: Self-pay | Admitting: Gastroenterology

## 2012-11-07 DIAGNOSIS — Z9889 Other specified postprocedural states: Secondary | ICD-10-CM

## 2012-11-07 DIAGNOSIS — Z853 Personal history of malignant neoplasm of breast: Secondary | ICD-10-CM

## 2012-11-25 ENCOUNTER — Ambulatory Visit
Admission: RE | Admit: 2012-11-25 | Discharge: 2012-11-25 | Disposition: A | Discharge status: Home / Self Care | Payer: Medicare Other | Source: Ambulatory Visit | Attending: Internal Medicine | Admitting: Internal Medicine

## 2012-11-25 DIAGNOSIS — Z9889 Other specified postprocedural states: Secondary | ICD-10-CM

## 2012-11-25 DIAGNOSIS — Z853 Personal history of malignant neoplasm of breast: Secondary | ICD-10-CM

## 2012-11-25 DIAGNOSIS — R928 Other abnormal and inconclusive findings on diagnostic imaging of breast: Secondary | ICD-10-CM | POA: Diagnosis not present

## 2012-12-20 ENCOUNTER — Ambulatory Visit (AMBULATORY_SURGERY_CENTER): Payer: Self-pay | Admitting: *Deleted

## 2012-12-20 VITALS — Ht 59.0 in | Wt 142.0 lb

## 2012-12-20 DIAGNOSIS — Z86010 Personal history of colonic polyps: Secondary | ICD-10-CM

## 2012-12-20 MED ORDER — MOVIPREP 100 G PO SOLR: ORAL | Status: DC

## 2012-12-20 NOTE — Progress Notes (Signed)
No allergies to eggs or soy. No problems with anesthesia.  

## 2013-01-09 ENCOUNTER — Encounter: Payer: Self-pay | Admitting: Gastroenterology

## 2013-01-09 ENCOUNTER — Ambulatory Visit (AMBULATORY_SURGERY_CENTER): Payer: Medicare Other | Admitting: Gastroenterology

## 2013-01-09 VITALS — BP 131/75 | HR 66 | Temp 97.4°F | Resp 16 | Ht 59.0 in | Wt 142.0 lb

## 2013-01-09 DIAGNOSIS — Z86010 Personal history of colonic polyps: Secondary | ICD-10-CM | POA: Diagnosis not present

## 2013-01-09 DIAGNOSIS — Z1211 Encounter for screening for malignant neoplasm of colon: Secondary | ICD-10-CM

## 2013-01-09 DIAGNOSIS — E785 Hyperlipidemia, unspecified: Secondary | ICD-10-CM | POA: Diagnosis not present

## 2013-01-09 MED ORDER — SODIUM CHLORIDE 0.9 % IV SOLN: 500.0000 mL | INTRAVENOUS | Status: DC

## 2013-01-09 NOTE — Patient Instructions (Signed)
Impressions/recommendations:  Normal colon  YOU HAD AN ENDOSCOPIC PROCEDURE TODAY AT THE [facility]: Refer to the procedure report that was given to you for any specific questions about what was found during the examination.  If the procedure report does not answer your questions, please call your gastroenterologist to clarify.  If you requested that your care partner not be given the details of your procedure findings, then the procedure report has been included in a sealed envelope for you to review at your convenience later.  YOU SHOULD EXPECT: Some feelings of bloating in the abdomen. Passage of more gas than usual.  Walking can help get rid of the air that was put into your GI tract during the procedure and reduce the bloating. If you had a lower endoscopy (such as a colonoscopy or flexible sigmoidoscopy) you may notice spotting of blood in your stool or on the toilet paper. If you underwent a bowel prep for your procedure, then you may not have a normal bowel movement for a few days.  DIET: Your first meal following the procedure should be a light meal and then it is ok to progress to your normal diet.  A half-sandwich or bowl of soup is an example of a good first meal.  Heavy or fried foods are harder to digest and may make you feel nauseous or bloated.  Likewise meals heavy in dairy and vegetables can cause extra gas to form and this can also increase the bloating.  Drink plenty of fluids but you should avoid alcoholic beverages for 24 hours.  ACTIVITY: Your care partner should take you home directly after the procedure.  You should plan to take it easy, moving slowly for the rest of the day.  You can resume normal activity the day after the procedure however you should NOT DRIVE or use heavy machinery for 24 hours (because of the sedation medicines used during the test).    SYMPTOMS TO REPORT IMMEDIATELY: A gastroenterologist can be reached at any hour.  During normal business  hours, 8:30 AM to 5:00 PM Monday through Friday, call [phone].  After hours and on weekends, please call the GI answering service at [phone] who will take a message and have the physician on call contact you.   Following lower endoscopy (colonoscopy or flexible sigmoidoscopy):  Excessive amounts of blood in the stool  Significant tenderness or worsening of abdominal pains  Swelling of the abdomen that is new, acute  Fever of 100F or higher  FOLLOW UP: If any biopsies were taken you will be contacted by phone or by letter within the next 1-3 weeks.  Call your gastroenterologist if you have not heard about the biopsies in 3 weeks.  Our staff will call the home number listed on your records the next business day following your procedure to check on you and address any questions or concerns that you may have at that time regarding the information given to you following your procedure. This is a courtesy call and so if there is no answer at the home number and we have not heard from you through the emergency physician on call, we will assume that you have returned to your regular daily activities without incident.  SIGNATURES/CONFIDENTIALITY: You and/or your care partner have signed paperwork which will be entered into your electronic medical record.  These signatures attest to the fact that that the information above on your After Visit Summary has been reviewed and is understood.  Full responsibility   of the confidentiality of this discharge information lies with you and/or your care-partner. 

## 2013-01-09 NOTE — Progress Notes (Signed)
Patient did not experience any of the following events: a burn prior to discharge; a fall within the facility; wrong site/side/patient/procedure/implant event; or a hospital transfer or hospital admission upon discharge from the facility. (G8907) Patient did not have preoperative order for IV antibiotic SSI prophylaxis. (G8918)  

## 2013-01-09 NOTE — Op Note (Signed)
[facility] [address] [place], [address]   COLONOSCOPY PROCEDURE REPORT  PATIENT: Brianna, York  MR#: [id number] BIRTHDATE: [birth year] , 78  yrs. old GENDER: Female ENDOSCOPIST: Mardella Layman, MD, [facility] REFERRED BY: PROCEDURE DATE:  01/09/2013 PROCEDURE:   Colonoscopy, screening First Screening Colonoscopy - Avg.  risk and is 50 yrs.  old or older - No.      History of Adenoma - Now for follow-up colonoscopy & has been > or = to 3 yrs. ASA CLASS:   Class II INDICATIONS:average risk screening. MEDICATIONS: propofol (Diprivan) 200mg  IV  DESCRIPTION OF PROCEDURE:   After the risks benefits and alternatives of the procedure were thoroughly explained, informed consent was obtained.  A digital rectal exam revealed no abnormalities of the rectum.   The LB [id number]  endoscope was introduced through the anus and advanced to the cecum, which was identified by both the appendix and ileocecal valve. No adverse events experienced.   The quality of the prep was excellent, using MoviPrep  The instrument was then slowly withdrawn as the colon was fully examined.      COLON FINDINGS: A normal appearing cecum, ileocecal valve, and appendiceal orifice were identified.  The ascending, hepatic flexure, transverse, splenic flexure, descending, sigmoid colon and rectum appeared unremarkable.  No polyps or cancers were seen. Retroflexed views revealed no abnormalities. The time to cecum=5 minutes 05 seconds.  Withdrawal time=6 minutes 10 seconds.  The scope was withdrawn and the procedure completed. COMPLICATIONS: There were no complications.  ENDOSCOPIC IMPRESSION: Normal colon ...no polyps notedn  RECOMMENDATIONS: 1.  Continue current medications 2.  Given your age, you will not need another colonoscopy for colon cancer screening or polyp surveillance.  These types of tests usually stop around the age 71.   eSigned:  Mardella Layman, MD, [facility]  01/09/2013 10:57 AM   cc:

## 2013-01-10 ENCOUNTER — Telehealth: Payer: Self-pay | Admitting: *Deleted

## 2013-01-10 NOTE — Telephone Encounter (Signed)
  Follow up Call-  Call back number 01/09/2013  Post procedure Call Back phone  # [phone]  Permission to leave phone message Yes     Patient questions:  Do you have a fever, pain , or abdominal swelling? no Pain Score  0 *  Have you tolerated food without any problems? yes  Have you been able to return to your normal activities? yes  Do you have any questions about your discharge instructions: Diet   no Medications  no Follow up visit  no  Do you have questions or concerns about your Care? no  Actions: * If pain score is 4 or above: No action needed, pain <4.

## 2013-02-03 ENCOUNTER — Encounter: Payer: Self-pay | Admitting: Oncology

## 2013-02-03 ENCOUNTER — Other Ambulatory Visit (HOSPITAL_BASED_OUTPATIENT_CLINIC_OR_DEPARTMENT_OTHER): Payer: Medicare Other | Admitting: Lab

## 2013-02-03 ENCOUNTER — Telehealth: Payer: Self-pay | Admitting: Oncology

## 2013-02-03 ENCOUNTER — Ambulatory Visit (HOSPITAL_BASED_OUTPATIENT_CLINIC_OR_DEPARTMENT_OTHER): Payer: Medicare Other | Admitting: Oncology

## 2013-02-03 VITALS — BP 146/74 | HR 73 | Temp 97.5°F | Resp 18 | Ht 59.0 in | Wt 142.5 lb

## 2013-02-03 DIAGNOSIS — C50912 Malignant neoplasm of unspecified site of left female breast: Secondary | ICD-10-CM

## 2013-02-03 DIAGNOSIS — C50419 Malignant neoplasm of upper-outer quadrant of unspecified female breast: Secondary | ICD-10-CM | POA: Diagnosis not present

## 2013-02-03 DIAGNOSIS — C50919 Malignant neoplasm of unspecified site of unspecified female breast: Secondary | ICD-10-CM

## 2013-02-03 LAB — COMPREHENSIVE METABOLIC PANEL (CC13)
ALT: 16 U/L (ref 0–55)
AST: 24 U/L (ref 5–34)
Albumin: 3.6 g/dL (ref 3.5–5.0)
Alkaline Phosphatase: 69 U/L (ref 40–150)
Anion Gap: 8 mEq/L (ref 3–11)
BUN: 18.1 mg/dL (ref 7.0–26.0)
CO2: 25 mEq/L (ref 22–29)
Calcium: 9.3 mg/dL (ref 8.4–10.4)
Chloride: 108 mEq/L (ref 98–109)
Creatinine: 0.9 mg/dL (ref 0.6–1.1)
Glucose: 85 mg/dl (ref 70–140)
Potassium: 4.3 mEq/L (ref 3.5–5.1)
Sodium: 142 mEq/L (ref 136–145)
Total Bilirubin: 0.33 mg/dL (ref 0.20–1.20)
Total Protein: 6.5 g/dL (ref 6.4–8.3)

## 2013-02-03 LAB — CBC WITH DIFFERENTIAL/PLATELET
BASO%: 0.4 % (ref 0.0–2.0)
Basophils Absolute: 0 10*3/uL (ref 0.0–0.1)
EOS%: 2.3 % (ref 0.0–7.0)
Eosinophils Absolute: 0.1 10*3/uL (ref 0.0–0.5)
HCT: 38.7 % (ref 34.8–46.6)
HGB: 12.7 g/dL (ref 11.6–15.9)
LYMPH%: 33.4 % (ref 14.0–49.7)
MCH: 30.6 pg (ref 25.1–34.0)
MCHC: 32.8 g/dL (ref 31.5–36.0)
MCV: 93.3 fL (ref 79.5–101.0)
MONO#: 0.7 10*3/uL (ref 0.1–0.9)
MONO%: 11.7 % (ref 0.0–14.0)
NEUT#: 2.9 10*3/uL (ref 1.5–6.5)
NEUT%: 52.2 % (ref 38.4–76.8)
Platelets: 157 10*3/uL (ref 145–400)
RBC: 4.15 10*6/uL (ref 3.70–5.45)
RDW: 13.3 % (ref 11.2–14.5)
WBC: 5.6 10*3/uL (ref 3.9–10.3)
lymph#: 1.9 10*3/uL (ref 0.9–3.3)

## 2013-02-03 NOTE — Telephone Encounter (Signed)
, °

## 2013-02-13 NOTE — Progress Notes (Signed)
OFFICE PROGRESS NOTE  Mark Perini,MD Glenna Fellows, MD Antony Blackbird, MD   DIAGNOSIS: 78 year old female with 1.5 cm invasive ductal carcinoma of the upper outer quadrant (T1 N0). The tumor was ER +98% PR negative proliferation marker 9% and HER-2/neu negative.  PRIOR THERAPY:  #1 status post lumpectomy on 09/16/2010 with the final pathology revealing a 1.5 cm invasive ductal carcinoma with calcifications, grade 1, perineural invasion was identified. 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 All margins were negative. So 2 sentinel nodes were negative for metastatic disease. The tumor was ER positive between 98 and 99% PR negative with a proliferation marker between 7 and 9% HER-2/neu negative.  #2 patient had Oncotype DX score performed it was 24 putting her in the intermediate risk category with her 5 year just to and recurrence of about 16%  #3 patient was not recommended radiation therapy and so Arimidex was initiated starting on August 11 unfortunately she developed significant side effects from it. And we held it for a little bit. She is now back on it. And is tolerating it well.  CURRENT THERAPY: Arimidex 1 mg by mouth daily  INTERVAL HISTORY: Brianna York 78 y.o. female returns for followup visit today. Overall she seems to be doing well. She is tolerating Arimidex quite well without any problems. She denies any fevers chills night sweats headaches shortness of breath chest pains palpitations no nausea or vomiting no myalgias or arthralgias. She is exercising and eating well. Remainder of the 10 point review of systems is negative.  MEDICAL HISTORY: Past Medical History  Diagnosis Date  . Breast cancer 02/16/2011  . Hyperlipidemia   . Osteopenia     ALLERGIES:  is allergic to ciprofloxacin; loratadine-pseudoephedrine er; penicillins; and tolterodine tartrate.  MEDICATIONS:  Current Outpatient Prescriptions  Medication Sig Dispense Refill  . AFLURIA PRESERVATIVE FREE  injection       . alendronate (FOSAMAX) 10 MG tablet Take 10 mg by mouth once a week. Take with a full glass of water on an empty stomach.      Marland Kitchen anastrozole (ARIMIDEX) 1 MG tablet Take 1 mg by mouth daily.      Marland Kitchen atorvastatin (LIPITOR) 10 MG tablet Take 10 mg by mouth daily.        Marland Kitchen MOVIPREP 100 G SOLR       . Vitamin D, Ergocalciferol, (DRISDOL) 50000 UNITS CAPS Take 50,000 Units by mouth every 7 (seven) days.         No current facility-administered medications for this visit.    SURGICAL HISTORY:  Past Surgical History  Procedure Laterality Date  . Breast lumpectomy Left 09-16-2010  . Knee arthroscopy Right 2010  . Tonsillectomy  1940  . Cesarean section  1958, 1960, 1963  . Cataract extraction, bilateral    . Bartholian cyst  1986    REVIEW OF SYSTEMS:  Pertinent items are noted in HPI.   PHYSICAL EXAMINATION: General appearance: alert, cooperative and appears stated age Head: Normocephalic, without obvious abnormality, atraumatic Neck: no adenopathy, no carotid bruit, no JVD, supple, symmetrical, trachea midline and thyroid not enlarged, symmetric, no tenderness/mass/nodules Lymph nodes: Cervical, supraclavicular, and axillary nodes normal. Resp: clear to auscultation bilaterally and normal percussion bilaterally Back: symmetric, no curvature. ROM normal. No CVA tenderness. Cardio: regular rate and rhythm, S1, S2 normal, no murmur, click, rub or gallop and normal apical impulse GI: soft, non-tender; bowel sounds normal; no masses,  no organomegaly Extremities: extremities normal, atraumatic, no cyanosis or edema Neurologic: Alert and oriented X  3, normal strength and tone. Normal symmetric reflexes. Normal coordination and gait Bilateral breast examinations are performed. Left breast reveals some thickening to thickening at the surgical site but it is stable. She is also noticing a little bit of heaviness in this breast. But there is no pain no dominant masses. Right breast no  masses or nipple discharge or  ECOG PERFORMANCE STATUS: 1 - Symptomatic but completely ambulatory  Blood pressure 146/74, pulse 73, temperature 97.5 F (36.4 C), temperature source Oral, resp. rate 18, height 4\' 11"  (1.499 m), weight 142 lb 8 oz (64.638 kg).  LABORATORY DATA: Lab Results  Component Value Date   WBC 5.6 02/03/2013   HGB 12.7 02/03/2013   HCT 38.7 02/03/2013   MCV 93.3 02/03/2013   PLT 157 02/03/2013      Chemistry      Component Value Date/Time   NA 142 02/03/2013 1122   NA 140 06/22/2011 1035   K 4.3 02/03/2013 1122   K 4.6 06/22/2011 1035   CL 109* 08/10/2012 1426   CL 105 06/22/2011 1035   CO2 25 02/03/2013 1122   CO2 27 06/22/2011 1035   BUN 18.1 02/03/2013 1122   BUN 21 06/22/2011 1035   CREATININE 0.9 02/03/2013 1122   CREATININE 0.88 06/22/2011 1035      Component Value Date/Time   CALCIUM 9.3 02/03/2013 1122   CALCIUM 9.4 06/22/2011 1035   ALKPHOS 69 02/03/2013 1122   ALKPHOS 58 06/22/2011 1035   AST 24 02/03/2013 1122   AST 19 06/22/2011 1035   ALT 16 02/03/2013 1122   ALT 17 06/22/2011 1035   BILITOT 0.33 02/03/2013 1122   BILITOT 0.4 06/22/2011 1035       RADIOGRAPHIC STUDIES:  No results found.  ASSESSMENT: 78 year old female with   #1stage I invasive ductal carcinoma of the left breast status post lumpectomy. The tumor was ER positive HER-2/neu negative PR negative favorable proliferation marker Ki-67. She overall is doing well and she will continue to be on the Arimidex. Patient today is complaining of left shoulder pain she thinks she may have pinched nerve what to my exam it feels like a muscle spasm.  #2 she will continue getting bone density and mammogram  PLAN:   #1 overall patient is doing well she has no evidence of recurrent disease.  #2 I will see her back in 6 months time.  All questions were answered. The patient knows to call the clinic with any problems, questions or concerns. We can certainly see the patient much sooner if  necessary.  I spent 15 minutes counseling the patient face to face. The total time spent in the appointment was 20 minutes.    Drue Second, MD Medical/Oncology [facility] [phone] (beeper) [phone] (Office)

## 2013-04-11 DIAGNOSIS — M899 Disorder of bone, unspecified: Secondary | ICD-10-CM | POA: Diagnosis not present

## 2013-04-11 DIAGNOSIS — M949 Disorder of cartilage, unspecified: Secondary | ICD-10-CM | POA: Diagnosis not present

## 2013-05-01 DIAGNOSIS — R82998 Other abnormal findings in urine: Secondary | ICD-10-CM | POA: Diagnosis not present

## 2013-05-01 DIAGNOSIS — E559 Vitamin D deficiency, unspecified: Secondary | ICD-10-CM | POA: Diagnosis not present

## 2013-05-01 DIAGNOSIS — E785 Hyperlipidemia, unspecified: Secondary | ICD-10-CM | POA: Diagnosis not present

## 2013-05-01 DIAGNOSIS — Z79899 Other long term (current) drug therapy: Secondary | ICD-10-CM | POA: Diagnosis not present

## 2013-05-08 DIAGNOSIS — E785 Hyperlipidemia, unspecified: Secondary | ICD-10-CM | POA: Diagnosis not present

## 2013-05-08 DIAGNOSIS — IMO0002 Reserved for concepts with insufficient information to code with codable children: Secondary | ICD-10-CM | POA: Diagnosis not present

## 2013-05-08 DIAGNOSIS — Z1331 Encounter for screening for depression: Secondary | ICD-10-CM | POA: Diagnosis not present

## 2013-05-08 DIAGNOSIS — N318 Other neuromuscular dysfunction of bladder: Secondary | ICD-10-CM | POA: Diagnosis not present

## 2013-05-08 DIAGNOSIS — Z0000 Encounter for general adult medical examination without abnormal findings: Secondary | ICD-10-CM | POA: Diagnosis not present

## 2013-05-08 DIAGNOSIS — N39498 Other specified urinary incontinence: Secondary | ICD-10-CM | POA: Diagnosis not present

## 2013-05-08 DIAGNOSIS — M1710 Unilateral primary osteoarthritis, unspecified knee: Secondary | ICD-10-CM | POA: Diagnosis not present

## 2013-05-08 DIAGNOSIS — E669 Obesity, unspecified: Secondary | ICD-10-CM | POA: Diagnosis not present

## 2013-05-08 DIAGNOSIS — N3090 Cystitis, unspecified without hematuria: Secondary | ICD-10-CM | POA: Diagnosis not present

## 2013-05-08 DIAGNOSIS — Z23 Encounter for immunization: Secondary | ICD-10-CM | POA: Diagnosis not present

## 2013-05-08 DIAGNOSIS — M899 Disorder of bone, unspecified: Secondary | ICD-10-CM | POA: Diagnosis not present

## 2013-05-08 DIAGNOSIS — M949 Disorder of cartilage, unspecified: Secondary | ICD-10-CM | POA: Diagnosis not present

## 2013-05-11 ENCOUNTER — Ambulatory Visit: Payer: Medicare Other | Admitting: Physical Therapy

## 2013-05-17 ENCOUNTER — Ambulatory Visit: Payer: Medicare Other | Attending: Internal Medicine | Admitting: Physical Therapy

## 2013-05-17 DIAGNOSIS — R42 Dizziness and giddiness: Secondary | ICD-10-CM | POA: Insufficient documentation

## 2013-05-17 DIAGNOSIS — M549 Dorsalgia, unspecified: Secondary | ICD-10-CM | POA: Diagnosis not present

## 2013-05-17 DIAGNOSIS — IMO0001 Reserved for inherently not codable concepts without codable children: Secondary | ICD-10-CM | POA: Diagnosis not present

## 2013-05-18 ENCOUNTER — Ambulatory Visit: Payer: Medicare Other | Admitting: Physical Therapy

## 2013-05-18 DIAGNOSIS — R42 Dizziness and giddiness: Secondary | ICD-10-CM | POA: Diagnosis not present

## 2013-05-18 DIAGNOSIS — M549 Dorsalgia, unspecified: Secondary | ICD-10-CM | POA: Diagnosis not present

## 2013-05-18 DIAGNOSIS — IMO0001 Reserved for inherently not codable concepts without codable children: Secondary | ICD-10-CM | POA: Diagnosis not present

## 2013-05-23 ENCOUNTER — Ambulatory Visit: Payer: Medicare Other | Admitting: Physical Therapy

## 2013-05-23 DIAGNOSIS — M549 Dorsalgia, unspecified: Secondary | ICD-10-CM | POA: Diagnosis not present

## 2013-05-23 DIAGNOSIS — R42 Dizziness and giddiness: Secondary | ICD-10-CM | POA: Diagnosis not present

## 2013-05-23 DIAGNOSIS — IMO0001 Reserved for inherently not codable concepts without codable children: Secondary | ICD-10-CM | POA: Diagnosis not present

## 2013-05-25 ENCOUNTER — Ambulatory Visit: Payer: Medicare Other | Admitting: Physical Therapy

## 2013-05-25 DIAGNOSIS — IMO0001 Reserved for inherently not codable concepts without codable children: Secondary | ICD-10-CM | POA: Diagnosis not present

## 2013-05-25 DIAGNOSIS — M549 Dorsalgia, unspecified: Secondary | ICD-10-CM | POA: Diagnosis not present

## 2013-05-25 DIAGNOSIS — R42 Dizziness and giddiness: Secondary | ICD-10-CM | POA: Diagnosis not present

## 2013-05-30 ENCOUNTER — Encounter: Payer: Medicare Other | Admitting: *Deleted

## 2013-06-01 ENCOUNTER — Ambulatory Visit: Payer: Medicare Other | Admitting: Physical Therapy

## 2013-06-01 DIAGNOSIS — R42 Dizziness and giddiness: Secondary | ICD-10-CM | POA: Diagnosis not present

## 2013-06-01 DIAGNOSIS — M549 Dorsalgia, unspecified: Secondary | ICD-10-CM | POA: Diagnosis not present

## 2013-06-01 DIAGNOSIS — IMO0001 Reserved for inherently not codable concepts without codable children: Secondary | ICD-10-CM | POA: Diagnosis not present

## 2013-06-05 ENCOUNTER — Ambulatory Visit: Payer: Medicare Other | Admitting: Physical Therapy

## 2013-06-05 DIAGNOSIS — M549 Dorsalgia, unspecified: Secondary | ICD-10-CM | POA: Diagnosis not present

## 2013-06-05 DIAGNOSIS — R42 Dizziness and giddiness: Secondary | ICD-10-CM | POA: Diagnosis not present

## 2013-06-05 DIAGNOSIS — IMO0001 Reserved for inherently not codable concepts without codable children: Secondary | ICD-10-CM | POA: Diagnosis not present

## 2013-06-08 ENCOUNTER — Ambulatory Visit: Payer: Medicare Other | Admitting: *Deleted

## 2013-06-08 DIAGNOSIS — IMO0001 Reserved for inherently not codable concepts without codable children: Secondary | ICD-10-CM | POA: Diagnosis not present

## 2013-06-08 DIAGNOSIS — M549 Dorsalgia, unspecified: Secondary | ICD-10-CM | POA: Diagnosis not present

## 2013-06-08 DIAGNOSIS — R42 Dizziness and giddiness: Secondary | ICD-10-CM | POA: Diagnosis not present

## 2013-08-01 ENCOUNTER — Telehealth: Payer: Self-pay | Admitting: Oncology

## 2013-08-01 NOTE — Telephone Encounter (Signed)
, °

## 2013-08-17 ENCOUNTER — Other Ambulatory Visit: Payer: Medicare Other

## 2013-08-17 ENCOUNTER — Ambulatory Visit: Payer: Medicare Other | Admitting: Oncology

## 2013-09-18 ENCOUNTER — Ambulatory Visit (HOSPITAL_BASED_OUTPATIENT_CLINIC_OR_DEPARTMENT_OTHER): Payer: Medicare Other | Admitting: Oncology

## 2013-09-18 ENCOUNTER — Other Ambulatory Visit (HOSPITAL_BASED_OUTPATIENT_CLINIC_OR_DEPARTMENT_OTHER): Payer: Medicare Other

## 2013-09-18 ENCOUNTER — Telehealth: Payer: Self-pay | Admitting: Hematology and Oncology

## 2013-09-18 ENCOUNTER — Encounter: Payer: Self-pay | Admitting: Oncology

## 2013-09-18 VITALS — BP 146/70 | HR 73 | Temp 97.9°F | Resp 18 | Ht 59.0 in | Wt 145.9 lb

## 2013-09-18 DIAGNOSIS — M810 Age-related osteoporosis without current pathological fracture: Secondary | ICD-10-CM | POA: Insufficient documentation

## 2013-09-18 DIAGNOSIS — Z170 Estrogen receptor positive status [ER+]: Secondary | ICD-10-CM | POA: Diagnosis not present

## 2013-09-18 DIAGNOSIS — C50919 Malignant neoplasm of unspecified site of unspecified female breast: Secondary | ICD-10-CM

## 2013-09-18 DIAGNOSIS — C50419 Malignant neoplasm of upper-outer quadrant of unspecified female breast: Secondary | ICD-10-CM

## 2013-09-18 LAB — COMPREHENSIVE METABOLIC PANEL (CC13)
ALT: 21 U/L (ref 0–55)
AST: 24 U/L (ref 5–34)
Albumin: 4 g/dL (ref 3.5–5.0)
Alkaline Phosphatase: 57 U/L (ref 40–150)
Anion Gap: 7 mEq/L (ref 3–11)
BUN: 12.9 mg/dL (ref 7.0–26.0)
CO2: 26 mEq/L (ref 22–29)
Calcium: 9.4 mg/dL (ref 8.4–10.4)
Chloride: 107 mEq/L (ref 98–109)
Creatinine: 1 mg/dL (ref 0.6–1.1)
Glucose: 93 mg/dl (ref 70–140)
Potassium: 4.5 mEq/L (ref 3.5–5.1)
Sodium: 140 mEq/L (ref 136–145)
Total Bilirubin: 0.51 mg/dL (ref 0.20–1.20)
Total Protein: 6.6 g/dL (ref 6.4–8.3)

## 2013-09-18 LAB — CBC WITH DIFFERENTIAL/PLATELET
BASO%: 0.3 % (ref 0.0–2.0)
Basophils Absolute: 0 10*3/uL (ref 0.0–0.1)
EOS%: 2.9 % (ref 0.0–7.0)
Eosinophils Absolute: 0.2 10*3/uL (ref 0.0–0.5)
HCT: 41 % (ref 34.8–46.6)
HGB: 13.7 g/dL (ref 11.6–15.9)
LYMPH%: 39.1 % (ref 14.0–49.7)
MCH: 30.9 pg (ref 25.1–34.0)
MCHC: 33.4 g/dL (ref 31.5–36.0)
MCV: 92.6 fL (ref 79.5–101.0)
MONO#: 0.7 10*3/uL (ref 0.1–0.9)
MONO%: 10.3 % (ref 0.0–14.0)
NEUT#: 3.1 10*3/uL (ref 1.5–6.5)
NEUT%: 47.4 % (ref 38.4–76.8)
Platelets: 173 10*3/uL (ref 145–400)
RBC: 4.43 10*6/uL (ref 3.70–5.45)
RDW: 13.3 % (ref 11.2–14.5)
WBC: 6.5 10*3/uL (ref 3.9–10.3)
lymph#: 2.6 10*3/uL (ref 0.9–3.3)

## 2013-09-18 MED ORDER — ANASTROZOLE 1 MG PO TABS: 1.0000 mg | ORAL_TABLET | Freq: Every day | ORAL | Status: DC

## 2013-09-18 NOTE — Progress Notes (Signed)
OFFICE PROGRESS NOTE  Mark Perini,MD Excell Seltzer, MD Gery Pray, MD   DIAGNOSIS: 79 year old female with 1.5 cm invasive ductal carcinoma of the upper outer quadrant (T1 N0). The tumor was ER +98% PR negative proliferation marker 9% and HER-2/neu negative.  PRIOR THERAPY:  #1 status post lumpectomy on 09/16/2010 with the final pathology revealing a 1.5 cm invasive ductal carcinoma with calcifications, grade 1, perineural invasion was identified. 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 All margins were negative. So 2 sentinel nodes were negative for metastatic disease. The tumor was ER positive between 98 and 99% PR negative with a proliferation marker between 7 and 9% HER-2/neu negative.  #2 patient had Oncotype DX score performed it was 24 putting her in the intermediate risk category with her 5 year just to and recurrence of about 16%  #3 patient was not recommended radiation therapy and so Arimidex was initiated starting on August 11 unfortunately she developed significant side effects from it. And we held it for a little bit. She is now back on it. And is tolerating it well.  CURRENT THERAPY: Arimidex 1 mg by mouth daily  INTERVAL HISTORY: Brianna York 79 y.o. female returns for followup visit today. Overall she seems to be doing well. She is tolerating Arimidex quite well without any problems. She denies any fevers chills night sweats headaches shortness of breath chest pains palpitations no nausea or vomiting no myalgias or arthralgias. Remainder of the 10 point review of systems is negative.  MEDICAL HISTORY: Past Medical History  Diagnosis Date  . Breast cancer 02/16/2011  . Hyperlipidemia   . Osteopenia     ALLERGIES:  is allergic to ciprofloxacin; loratadine-pseudoephedrine er; penicillins; and tolterodine tartrate.  MEDICATIONS:  Current Outpatient Prescriptions  Medication Sig Dispense Refill  . anastrozole (ARIMIDEX) 1 MG tablet Take 1 tablet (1 mg total) by  mouth daily. Take 1 mg by mouth daily.  90 tablet  3   No current facility-administered medications for this visit.    SURGICAL HISTORY:  Past Surgical History  Procedure Laterality Date  . Breast lumpectomy Left 09-16-2010  . Knee arthroscopy Right 2010  . Tonsillectomy  1940  . Cesarean section  [place]  . Cataract extraction, bilateral    . Bartholian cyst  1986    REVIEW OF SYSTEMS:  Pertinent items are noted in HPI.   PHYSICAL EXAMINATION: General appearance: alert, cooperative and appears stated age Head: Normocephalic, without obvious abnormality, atraumatic Neck: no adenopathy, no carotid bruit, no JVD, supple, symmetrical, trachea midline and thyroid not enlarged, symmetric, no tenderness/mass/nodules Lymph nodes: Cervical, supraclavicular, and axillary nodes normal. Resp: clear to auscultation bilaterally and normal percussion bilaterally Back: symmetric, no curvature. ROM normal. No CVA tenderness. Cardio: regular rate and rhythm, S1, S2 normal, no murmur, click, rub or gallop and normal apical impulse GI: soft, non-tender; bowel sounds normal; no masses,  no organomegaly Extremities: extremities normal, atraumatic, no cyanosis or edema Neurologic: Alert and oriented X 3, normal strength and tone. Normal symmetric reflexes. Normal coordination and gait Bilateral breast examinations are performed. Left breast reveals some thickening to thickening at the surgical site but it is stable. She is also noticing a little bit of heaviness in this breast. But there is no pain no dominant masses. Right breast no masses or nipple discharge or  ECOG PERFORMANCE STATUS: 1 - Symptomatic but completely ambulatory  Blood pressure 146/70, pulse 73, temperature 97.9 F (36.6 C), temperature source Oral, resp. rate 18, height '4\' 11"'  (1.499  m), weight 145 lb 14.4 oz (66.18 kg), SpO2 100.00%.  LABORATORY DATA: Lab Results  Component Value Date   WBC 6.5 09/18/2013   HGB 13.7 09/18/2013    HCT 41.0 09/18/2013   MCV 92.6 09/18/2013   PLT 173 09/18/2013      Chemistry      Component Value Date/Time   NA 140 09/18/2013 1219   NA 140 06/22/2011 1035   K 4.5 09/18/2013 1219   K 4.6 06/22/2011 1035   CL 109* 08/10/2012 1426   CL 105 06/22/2011 1035   CO2 26 09/18/2013 1219   CO2 27 06/22/2011 1035   BUN 12.9 09/18/2013 1219   BUN 21 06/22/2011 1035   CREATININE 1.0 09/18/2013 1219   CREATININE 0.88 06/22/2011 1035      Component Value Date/Time   CALCIUM 9.4 09/18/2013 1219   CALCIUM 9.4 06/22/2011 1035   ALKPHOS 57 09/18/2013 1219   ALKPHOS 58 06/22/2011 1035   AST 24 09/18/2013 1219   AST 19 06/22/2011 1035   ALT 21 09/18/2013 1219   ALT 17 06/22/2011 1035   BILITOT 0.51 09/18/2013 1219   BILITOT 0.4 06/22/2011 1035       RADIOGRAPHIC STUDIES:  No results found.  ASSESSMENT: 79 year old female with   #1stage I invasive ductal carcinoma of the left breast status post lumpectomy. The tumor was ER positive HER-2/neu negative PR negative favorable proliferation marker Ki-67. She overall is doing well and she will continue to be on the Arimidex.   #2 she will continue getting bone density and mammogram.  PLAN:   #1 overall patient is doing well she has no evidence of recurrent disease. Continue Arimidex. A new prescription was sent to her pharmacy.  #2 patient's last bone density was performed in January 2015 by her primary care provider. She remains on Fosamax. I have requested her annual mammogram be performed in September of 2015.  #3 I will see her back in 6 months time.  All questions were answered. The patient knows to call the clinic with any problems, questions or concerns. We can certainly see the patient much sooner if necessary.  I spent 15 minutes counseling the patient face to face. The total time spent in the appointment was 20 minutes.    Mikey Bussing, DNP, AGPCNP-BC

## 2013-09-18 NOTE — Telephone Encounter (Signed)
Gave pt appt for lab,MD and mammogram for Sept

## 2013-11-07 DIAGNOSIS — Z1331 Encounter for screening for depression: Secondary | ICD-10-CM | POA: Diagnosis not present

## 2013-11-07 DIAGNOSIS — N39498 Other specified urinary incontinence: Secondary | ICD-10-CM | POA: Diagnosis not present

## 2013-11-07 DIAGNOSIS — Z6830 Body mass index (BMI) 30.0-30.9, adult: Secondary | ICD-10-CM | POA: Diagnosis not present

## 2013-11-07 DIAGNOSIS — K219 Gastro-esophageal reflux disease without esophagitis: Secondary | ICD-10-CM | POA: Diagnosis not present

## 2013-11-07 DIAGNOSIS — F411 Generalized anxiety disorder: Secondary | ICD-10-CM | POA: Diagnosis not present

## 2013-11-07 DIAGNOSIS — N318 Other neuromuscular dysfunction of bladder: Secondary | ICD-10-CM | POA: Diagnosis not present

## 2013-12-12 ENCOUNTER — Ambulatory Visit
Admission: RE | Admit: 2013-12-12 | Discharge: 2013-12-12 | Disposition: A | Discharge status: Home / Self Care | Payer: Medicare Other | Source: Ambulatory Visit | Attending: Oncology | Admitting: Oncology

## 2013-12-12 DIAGNOSIS — R928 Other abnormal and inconclusive findings on diagnostic imaging of breast: Secondary | ICD-10-CM | POA: Diagnosis not present

## 2013-12-12 DIAGNOSIS — Z853 Personal history of malignant neoplasm of breast: Secondary | ICD-10-CM | POA: Diagnosis not present

## 2013-12-12 DIAGNOSIS — C50919 Malignant neoplasm of unspecified site of unspecified female breast: Secondary | ICD-10-CM

## 2013-12-13 DIAGNOSIS — Z23 Encounter for immunization: Secondary | ICD-10-CM | POA: Diagnosis not present

## 2014-02-17 ENCOUNTER — Telehealth: Payer: Self-pay | Admitting: Hematology and Oncology

## 2014-02-17 NOTE — Telephone Encounter (Signed)
s.w. pt and advised on d.t change .Marland Kitchen..pt ok and aware

## 2014-03-21 ENCOUNTER — Ambulatory Visit: Payer: Medicare Other | Admitting: Hematology and Oncology

## 2014-03-21 ENCOUNTER — Other Ambulatory Visit: Payer: Medicare Other

## 2014-04-05 ENCOUNTER — Ambulatory Visit (HOSPITAL_BASED_OUTPATIENT_CLINIC_OR_DEPARTMENT_OTHER): Payer: Medicare Other | Admitting: Hematology and Oncology

## 2014-04-05 ENCOUNTER — Telehealth: Payer: Self-pay | Admitting: Hematology and Oncology

## 2014-04-05 ENCOUNTER — Other Ambulatory Visit (HOSPITAL_BASED_OUTPATIENT_CLINIC_OR_DEPARTMENT_OTHER): Payer: Medicare Other

## 2014-04-05 VITALS — BP 144/62 | HR 71 | Temp 98.2°F | Resp 18 | Ht 59.0 in | Wt 140.3 lb

## 2014-04-05 DIAGNOSIS — C50412 Malignant neoplasm of upper-outer quadrant of left female breast: Secondary | ICD-10-CM | POA: Diagnosis not present

## 2014-04-05 DIAGNOSIS — Z170 Estrogen receptor positive status [ER+]: Secondary | ICD-10-CM | POA: Diagnosis not present

## 2014-04-05 DIAGNOSIS — C50919 Malignant neoplasm of unspecified site of unspecified female breast: Secondary | ICD-10-CM

## 2014-04-05 DIAGNOSIS — M8580 Other specified disorders of bone density and structure, unspecified site: Secondary | ICD-10-CM | POA: Diagnosis not present

## 2014-04-05 LAB — CBC WITH DIFFERENTIAL/PLATELET
BASO%: 0.4 % (ref 0.0–2.0)
Basophils Absolute: 0 10*3/uL (ref 0.0–0.1)
EOS%: 2.5 % (ref 0.0–7.0)
Eosinophils Absolute: 0.2 10*3/uL (ref 0.0–0.5)
HCT: 39.1 % (ref 34.8–46.6)
HGB: 12.8 g/dL (ref 11.6–15.9)
LYMPH%: 35.7 % (ref 14.0–49.7)
MCH: 30.7 pg (ref 25.1–34.0)
MCHC: 32.7 g/dL (ref 31.5–36.0)
MCV: 93.8 fL (ref 79.5–101.0)
MONO#: 0.7 10*3/uL (ref 0.1–0.9)
MONO%: 9.7 % (ref 0.0–14.0)
NEUT#: 3.8 10*3/uL (ref 1.5–6.5)
NEUT%: 51.7 % (ref 38.4–76.8)
Platelets: 148 10*3/uL (ref 145–400)
RBC: 4.17 10*6/uL (ref 3.70–5.45)
RDW: 13.1 % (ref 11.2–14.5)
WBC: 7.3 10*3/uL (ref 3.9–10.3)
lymph#: 2.6 10*3/uL (ref 0.9–3.3)

## 2014-04-05 LAB — COMPREHENSIVE METABOLIC PANEL (CC13)
ALT: 12 U/L (ref 0–55)
AST: 17 U/L (ref 5–34)
Albumin: 3.8 g/dL (ref 3.5–5.0)
Alkaline Phosphatase: 63 U/L (ref 40–150)
Anion Gap: 7 mEq/L (ref 3–11)
BUN: 13.9 mg/dL (ref 7.0–26.0)
CO2: 27 mEq/L (ref 22–29)
Calcium: 8.8 mg/dL (ref 8.4–10.4)
Chloride: 108 mEq/L (ref 98–109)
Creatinine: 0.9 mg/dL (ref 0.6–1.1)
EGFR: 60 mL/min/{1.73_m2} — ABNORMAL LOW (ref >90)
Glucose: 93 mg/dl (ref 70–140)
Potassium: 4.5 mEq/L (ref 3.5–5.1)
Sodium: 142 mEq/L (ref 136–145)
Total Bilirubin: 0.55 mg/dL (ref 0.20–1.20)
Total Protein: 6.3 g/dL — ABNORMAL LOW (ref 6.4–8.3)

## 2014-04-05 NOTE — Assessment & Plan Note (Signed)
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  Arimidex toxicities: Denies any hot flashes or muscle aches or pains.  Breast cancer surveillance:  1. Breast exam 04/05/2014 is normal,  2. mammograms done 12/12/2013 normal  Return to clinic one see her for follow-ups.

## 2014-04-05 NOTE — Telephone Encounter (Signed)
, °

## 2014-04-05 NOTE — Progress Notes (Signed)
Patient Care Team: Jerlyn Ly, MD as PCP - General (Internal Medicine)  DIAGNOSIS: No matching staging information was found for the patient.  SUMMARY OF ONCOLOGIC HISTORY:   Breast cancer of upper-outer quadrant of left female breast   09/16/2010 Surgery Left breast 1.5 cm invasive ductal carcinoma with calcifications, grade 1, perineural invasion was seen, DCIS, margins negative, 2 sentinel nodes negative, ER 99%, PR negative, Ki-67 79%, HER-2 negative, Oncotype 24, 16% ROR   10/25/2010 -  Anti-estrogen oral therapy Arimidex 1 mg daily developed side effects and was held briefly and restarted    CHIEF COMPLIANT: follow-up of breast cancer on anastrozole  INTERVAL HISTORY: Brianna York is a 80 year old lady with above-mentioned history of left-sided breast cancer diagnosed in 2012 and had lumpectomy and is now on anastrozole adjuvant therapy. She is tolerating it extremely well without any side effects or concerns. She was diagnosed with osteopenia and is now on Fosamax once a week. She denies any new lumps or nodules in the breasts.  REVIEW OF SYSTEMS:   Constitutional: Denies fevers, chills or abnormal weight loss Eyes: Denies blurriness of vision Ears, nose, mouth, throat, and face: Denies mucositis or sore throat Respiratory: Denies cough, dyspnea or wheezes Cardiovascular: Denies palpitation, chest discomfort or lower extremity swelling Gastrointestinal:  Denies nausea, heartburn or change in bowel habits Skin: Denies abnormal skin rashes Lymphatics: Denies new lymphadenopathy or easy bruising Neurological:Denies numbness, tingling or new weaknesses Behavioral/Psych: Mood is stable, no new changes  Breast:  denies any pain or lumps or nodules in either breasts All other systems were reviewed with the patient and are negative.  I have reviewed the past medical history, past surgical history, social history and family history with the patient and they are unchanged from  previous note.  ALLERGIES:  is allergic to ciprofloxacin; loratadine-pseudoephedrine er; penicillins; and tolterodine tartrate.  MEDICATIONS:  Current Outpatient Prescriptions  Medication Sig Dispense Refill  . anastrozole (ARIMIDEX) 1 MG tablet Take 1 tablet (1 mg total) by mouth daily. Take 1 mg by mouth daily. 90 tablet 3   No current facility-administered medications for this visit.    PHYSICAL EXAMINATION: ECOG PERFORMANCE STATUS: 0 - Asymptomatic  Filed Vitals:   04/05/14 1022  BP: 144/62  Pulse: 71  Temp: 98.2 F (36.8 C)  Resp: 18   Filed Weights   04/05/14 1022  Weight: 140 lb 4.8 oz (63.64 kg)    GENERAL:alert, no distress and comfortable SKIN: skin color, texture, turgor are normal, no rashes or significant lesions EYES: normal, Conjunctiva are pink and non-injected, sclera clear OROPHARYNX:no exudate, no erythema and lips, buccal mucosa, and tongue normal  NECK: supple, thyroid normal size, non-tender, without nodularity LYMPH:  no palpable lymphadenopathy in the cervical, axillary or inguinal LUNGS: clear to auscultation and percussion with normal breathing effort HEART: regular rate & rhythm and no murmurs and no lower extremity edema ABDOMEN:abdomen soft, non-tender and normal bowel sounds Musculoskeletal:no cyanosis of digits and no clubbing  NEURO: alert & oriented x 3 with fluent speech, no focal motor/sensory deficits BREAST: No palpable masses or nodules in either right or left breasts. No palpable axillary supraclavicular or infraclavicular adenopathy no breast tenderness or nipple discharge. (exam performed in the presence of a chaperone)  LABORATORY DATA:  I have reviewed the data as listed   Chemistry      Component Value Date/Time   NA 142 04/05/2014 0941   NA 140 06/22/2011 1035   K 4.5 04/05/2014 0941   K  4.6 06/22/2011 1035   CL 109* 08/10/2012 1426   CL 105 06/22/2011 1035   CO2 27 04/05/2014 0941   CO2 27 06/22/2011 1035   BUN 13.9  04/05/2014 0941   BUN 21 06/22/2011 1035   CREATININE 0.9 04/05/2014 0941   CREATININE 0.88 06/22/2011 1035      Component Value Date/Time   CALCIUM 8.8 04/05/2014 0941   CALCIUM 9.4 06/22/2011 1035   ALKPHOS 63 04/05/2014 0941   ALKPHOS 58 06/22/2011 1035   AST 17 04/05/2014 0941   AST 19 06/22/2011 1035   ALT 12 04/05/2014 0941   ALT 17 06/22/2011 1035   BILITOT 0.55 04/05/2014 0941   BILITOT 0.4 06/22/2011 1035       Lab Results  Component Value Date   WBC 7.3 04/05/2014   HGB 12.8 04/05/2014   HCT 39.1 04/05/2014   MCV 93.8 04/05/2014   PLT 148 04/05/2014   NEUTROABS 3.8 04/05/2014     RADIOGRAPHIC STUDIES: I have personally reviewed the radiology reports and agreed with their findings. Mammogram September 2015 is normal  ASSESSMENT & PLAN:  Breast cancer of upper-outer quadrant of left female breast Left breast invasive ductal carcinoma T1 cN0 M0 stage IA, 1.5 cm, ER 98%, PR 0%, Ki-67 9%, HER-2 negative, Oncotype DX 24, 16% risk of recurrence, did not get radiation, currently on Arimidex 1 mg daily since August 2012.  Arimidex toxicities: Denies any hot flashes or muscle aches or pains.  Breast cancer surveillance:  1. Breast exam 04/05/2014 is normal,  2. mammograms done 12/12/2013 normal  Return to clinic one see her for follow-ups.    No orders of the defined types were placed in this encounter.   The patient has a good understanding of the overall plan. she agrees with it. She will call with any problems that may develop before her next visit here.   Rulon Eisenmenger, MD

## 2014-05-31 DIAGNOSIS — E785 Hyperlipidemia, unspecified: Secondary | ICD-10-CM | POA: Diagnosis not present

## 2014-05-31 DIAGNOSIS — R8299 Other abnormal findings in urine: Secondary | ICD-10-CM | POA: Diagnosis not present

## 2014-05-31 DIAGNOSIS — E559 Vitamin D deficiency, unspecified: Secondary | ICD-10-CM | POA: Diagnosis not present

## 2014-06-07 DIAGNOSIS — Z1231 Encounter for screening mammogram for malignant neoplasm of breast: Secondary | ICD-10-CM | POA: Diagnosis not present

## 2014-06-07 DIAGNOSIS — Z1212 Encounter for screening for malignant neoplasm of rectum: Secondary | ICD-10-CM | POA: Diagnosis not present

## 2014-06-07 DIAGNOSIS — Z0000 Encounter for general adult medical examination without abnormal findings: Secondary | ICD-10-CM | POA: Diagnosis not present

## 2014-06-07 DIAGNOSIS — Z1389 Encounter for screening for other disorder: Secondary | ICD-10-CM | POA: Diagnosis not present

## 2014-06-07 DIAGNOSIS — M549 Dorsalgia, unspecified: Secondary | ICD-10-CM | POA: Diagnosis not present

## 2014-06-07 DIAGNOSIS — B009 Herpesviral infection, unspecified: Secondary | ICD-10-CM | POA: Diagnosis not present

## 2014-06-07 DIAGNOSIS — K219 Gastro-esophageal reflux disease without esophagitis: Secondary | ICD-10-CM | POA: Diagnosis not present

## 2014-06-07 DIAGNOSIS — H9190 Unspecified hearing loss, unspecified ear: Secondary | ICD-10-CM | POA: Diagnosis not present

## 2014-06-07 DIAGNOSIS — E785 Hyperlipidemia, unspecified: Secondary | ICD-10-CM | POA: Diagnosis not present

## 2014-06-07 DIAGNOSIS — E669 Obesity, unspecified: Secondary | ICD-10-CM | POA: Diagnosis not present

## 2014-06-07 DIAGNOSIS — C50919 Malignant neoplasm of unspecified site of unspecified female breast: Secondary | ICD-10-CM | POA: Diagnosis not present

## 2014-06-07 DIAGNOSIS — Z6829 Body mass index (BMI) 29.0-29.9, adult: Secondary | ICD-10-CM | POA: Diagnosis not present

## 2014-06-07 DIAGNOSIS — F419 Anxiety disorder, unspecified: Secondary | ICD-10-CM | POA: Diagnosis not present

## 2014-06-19 ENCOUNTER — Telehealth: Payer: Self-pay

## 2014-06-19 NOTE — Telephone Encounter (Signed)
Charting error.

## 2014-06-19 NOTE — Telephone Encounter (Signed)
Lab report dtd 05/31/14 rcvd from [facility].  Reviewed by Dr. Lindi Adie.  Sent to scan.

## 2014-06-26 DIAGNOSIS — M1712 Unilateral primary osteoarthritis, left knee: Secondary | ICD-10-CM | POA: Diagnosis not present

## 2014-06-26 DIAGNOSIS — M25562 Pain in left knee: Secondary | ICD-10-CM | POA: Diagnosis not present

## 2014-07-23 DIAGNOSIS — M542 Cervicalgia: Secondary | ICD-10-CM | POA: Diagnosis not present

## 2014-07-23 DIAGNOSIS — M546 Pain in thoracic spine: Secondary | ICD-10-CM | POA: Diagnosis not present

## 2014-07-23 DIAGNOSIS — M25512 Pain in left shoulder: Secondary | ICD-10-CM | POA: Diagnosis not present

## 2014-07-23 DIAGNOSIS — M9901 Segmental and somatic dysfunction of cervical region: Secondary | ICD-10-CM | POA: Diagnosis not present

## 2014-07-23 DIAGNOSIS — M9902 Segmental and somatic dysfunction of thoracic region: Secondary | ICD-10-CM | POA: Diagnosis not present

## 2014-07-27 DIAGNOSIS — M9902 Segmental and somatic dysfunction of thoracic region: Secondary | ICD-10-CM | POA: Diagnosis not present

## 2014-07-27 DIAGNOSIS — M25512 Pain in left shoulder: Secondary | ICD-10-CM | POA: Diagnosis not present

## 2014-07-27 DIAGNOSIS — M9901 Segmental and somatic dysfunction of cervical region: Secondary | ICD-10-CM | POA: Diagnosis not present

## 2014-07-27 DIAGNOSIS — M546 Pain in thoracic spine: Secondary | ICD-10-CM | POA: Diagnosis not present

## 2014-07-27 DIAGNOSIS — M542 Cervicalgia: Secondary | ICD-10-CM | POA: Diagnosis not present

## 2014-08-14 ENCOUNTER — Telehealth: Payer: Self-pay | Admitting: *Deleted

## 2014-08-14 NOTE — Telephone Encounter (Signed)
"  I now have a small lump at the base of my neck that needs to be looked at.  Could someone call me at [phone] or [phone]."   Called Brianna York who reports this was "noticed Wednesday a week ago (06-01-2014).  It's on the right side at the base of my neck 3 to 4 inches below my ear.  It's a little bigger than a green pea."  Denies pain, denies fever.  Admits to cold, sore throat that started last Wednesday she has been "doctoring herself" with some residual symptoms.  "It seemed smaller the second day."  Advised to call PCP and this nurse will notify Dr. Lindi Adie for any instructions or orders.

## 2014-08-27 DIAGNOSIS — R591 Generalized enlarged lymph nodes: Secondary | ICD-10-CM | POA: Diagnosis not present

## 2014-08-27 DIAGNOSIS — Z6828 Body mass index (BMI) 28.0-28.9, adult: Secondary | ICD-10-CM | POA: Diagnosis not present

## 2014-08-27 DIAGNOSIS — R05 Cough: Secondary | ICD-10-CM | POA: Diagnosis not present

## 2014-09-05 ENCOUNTER — Other Ambulatory Visit: Payer: Self-pay | Admitting: Internal Medicine

## 2014-09-05 DIAGNOSIS — R591 Generalized enlarged lymph nodes: Secondary | ICD-10-CM | POA: Diagnosis not present

## 2014-09-05 DIAGNOSIS — Z853 Personal history of malignant neoplasm of breast: Secondary | ICD-10-CM | POA: Diagnosis not present

## 2014-09-05 DIAGNOSIS — Z6828 Body mass index (BMI) 28.0-28.9, adult: Secondary | ICD-10-CM | POA: Diagnosis not present

## 2014-09-06 ENCOUNTER — Ambulatory Visit
Admission: RE | Admit: 2014-09-06 | Discharge: 2014-09-06 | Disposition: A | Discharge status: Home / Self Care | Payer: Medicare Other | Source: Ambulatory Visit | Attending: Internal Medicine | Admitting: Internal Medicine

## 2014-09-06 DIAGNOSIS — R591 Generalized enlarged lymph nodes: Secondary | ICD-10-CM

## 2014-09-06 DIAGNOSIS — Z853 Personal history of malignant neoplasm of breast: Secondary | ICD-10-CM | POA: Diagnosis not present

## 2014-09-06 DIAGNOSIS — R911 Solitary pulmonary nodule: Secondary | ICD-10-CM | POA: Diagnosis not present

## 2014-09-06 DIAGNOSIS — R590 Localized enlarged lymph nodes: Secondary | ICD-10-CM | POA: Diagnosis not present

## 2014-09-06 MED ORDER — IOPAMIDOL (ISOVUE-300) INJECTION 61%
75.0000 mL | Freq: Once | INTRAVENOUS | Status: AC | PRN
  Administered 2014-09-06: 75 mL via INTRAVENOUS

## 2014-09-10 ENCOUNTER — Other Ambulatory Visit: Payer: Self-pay

## 2014-09-26 DIAGNOSIS — R590 Localized enlarged lymph nodes: Secondary | ICD-10-CM | POA: Diagnosis not present

## 2014-10-12 DIAGNOSIS — Z961 Presence of intraocular lens: Secondary | ICD-10-CM | POA: Diagnosis not present

## 2014-10-12 DIAGNOSIS — H1851 Endothelial corneal dystrophy: Secondary | ICD-10-CM | POA: Diagnosis not present

## 2014-10-12 DIAGNOSIS — H04123 Dry eye syndrome of bilateral lacrimal glands: Secondary | ICD-10-CM | POA: Diagnosis not present

## 2014-11-07 ENCOUNTER — Encounter: Payer: Self-pay | Admitting: Hematology and Oncology

## 2014-11-07 ENCOUNTER — Other Ambulatory Visit: Payer: Self-pay | Admitting: *Deleted

## 2014-11-07 DIAGNOSIS — C50412 Malignant neoplasm of upper-outer quadrant of left female breast: Secondary | ICD-10-CM

## 2014-11-07 MED ORDER — ANASTROZOLE 1 MG PO TABS: 1.0000 mg | ORAL_TABLET | Freq: Every day | ORAL | Status: DC

## 2014-11-13 ENCOUNTER — Encounter: Payer: Self-pay | Admitting: General Surgery

## 2014-11-13 ENCOUNTER — Encounter (HOSPITAL_BASED_OUTPATIENT_CLINIC_OR_DEPARTMENT_OTHER): Payer: Self-pay | Admitting: *Deleted

## 2014-11-13 NOTE — Progress Notes (Signed)
Brianna York DOB: 02/14/1935 Widowed / Language: Undefined / Race: Undefined Female  History of Present Illness Patient words: lipoma neck.  The patient is a 80 year old female   Note:She was referred by Dr. Joylene Draft because of cervical lymphadenopathy. She has a history of left-sided breast cancer. She had a upper respiratory infection recently. She noted an enlarged lymph node in the right posterior cervical triangle area. She was treated with a course of antibiotics but this lymph node persisted. CT scan demonstrated multiple visual lymph nodes all of which were less than 1 cm. She's been sent over here to discuss biopsy of the right cervical lymph node. No fevers. No night sweats. Slight decrease in energy level although she's been very busy with family matters.   Other Problems  Bladder Problems Breast Cancer Heart murmur High blood pressure Inguinal Hernia Lump In Breast  Past Surgical History Breast Biopsy Left. Breast Mass; Local Excision Left. Cataract Surgery Bilateral. Cesarean Section - Multiple Oral Surgery Sentinel Lymph Node Biopsy Tonsillectomy  Diagnostic Studies History Colonoscopy 1-5 years ago Mammogram within last year Pap Smear 1-5 years ago  Allergies  Ciprofloxacin *CHEMICALS* Loratadine *CHEMICALS* Penicillinm*Assorted Classes Tolterodine Tartrate *URINARY ANTISPASMODICS*  Medication History Anastrozole (1MG  Tablet, Oral) Active. Atorvastatin Calcium (10MG  Tablet, Oral) Active. ValACYclovir HCl (1GM Tablet, Oral) Active. Medications Reconciled  Social History  Alcohol use Occasional alcohol use. Caffeine use Coffee, Tea. No drug use Tobacco use Former smoker.  Family History  Arthritis Mother. Cancer Father, Mother. Diabetes Mellitus Brother.  Pregnancy / Birth HistoryAge at menarche 56 years. Age of menopause 4-50 Gravida 5 Maternal age 77-20 Para 5  Review of SystemsGeneral  Present- Appetite Loss and Weight Loss. Not Present- Chills, Fatigue, Fever, Night Sweats and Weight Gain. Skin Not Present- Change in Wart/Mole, Dryness, Hives, Jaundice, New Lesions, Non-Healing Wounds, Rash and Ulcer. HEENT Present- Seasonal Allergies and Wears glasses/contact lenses. Not Present- Earache, Hearing Loss, Hoarseness, Nose Bleed, Oral Ulcers, Ringing in the Ears, Sinus Pain, Sore Throat, Visual Disturbances and Yellow Eyes. Respiratory Not Present- Bloody sputum, Chronic Cough, Difficulty Breathing, Snoring and Wheezing. Cardiovascular Present- Leg Cramps. Not Present- Chest Pain, Difficulty Breathing Lying Down, Palpitations, Rapid Heart Rate, Shortness of Breath and Swelling of Extremities. Gastrointestinal Present- Indigestion. Not Present- Abdominal Pain, Bloating, Bloody Stool, Change in Bowel Habits, Chronic diarrhea, Constipation, Difficulty Swallowing, Excessive gas, Gets full quickly at meals, Hemorrhoids, Nausea, Rectal Pain and Vomiting. Female Genitourinary Present- Urgency. Not Present- Frequency, Nocturia, Painful Urination and Pelvic Pain. Musculoskeletal Present- Back Pain and Joint Pain. Not Present- Joint Stiffness, Muscle Pain, Muscle Weakness and Swelling of Extremities. Neurological Not Present- Decreased Memory, Fainting, Headaches, Numbness, Seizures, Tingling, Tremor, Trouble walking and Weakness. Psychiatric Not Present- Anxiety, Bipolar, Change in Sleep Pattern, Depression, Fearful and Frequent crying. Endocrine Not Present- Cold Intolerance, Excessive Hunger, Hair Changes, Heat Intolerance, Hot flashes and New Diabetes. Hematology Present- Easy Bruising and Gland problems. Not Present- Excessive bleeding, HIV and Persistent Infections.    Physical Exam General Note: General: WDWN elderly female in NAD. Pleasant and cooperative. A friend is with her.  HEENT: [place]/AT, no facial masses  NECK: Supple, no thyroid enlargement.  CV: RRR, no murmur, no  JVD.  CHEST: Breath sounds equal and clear. Respirations nonlabored.  ABDOMEN: Soft, nontender, lower midline scar.  MUSCULOSKELETAL: FROM, good muscle tone, no edema, no venous stasis changes  LYMPHATIC: 1 cm palpable right lateral cervical soft tissue mass.  NEUROLOGIC: Alert and oriented, answers questions appropriately.  PSYCHIATRIC: Normal mood, affect ,  and behavior.     Assessment & Plan  LYMPHADENOPATHY OF RIGHT CERVICAL REGION (785.6  R59.0) Impression: She has a history of left-sided breast cancer. Given the persistence of this enlarged lymph node which measures 1 cm on my exam, I recommended removal. The other option would be follow-up CT scan in 3-6 months. She is interested in having it removed.  Plan: Excisional biopsy of right cervical lymph node. We discussed the procedure and the risks. The risks include but are not limited to bleeding, infection, nerve injury, wound healing problems, anesthesia. She seems to understand and would like to proceed.  Jackolyn Confer, MD

## 2014-11-15 ENCOUNTER — Encounter (HOSPITAL_BASED_OUTPATIENT_CLINIC_OR_DEPARTMENT_OTHER)
Admission: RE | Admit: 2014-11-15 | Discharge: 2014-11-15 | Disposition: A | Discharge status: Home / Self Care | Payer: Medicare Other | Source: Ambulatory Visit | Attending: General Surgery | Admitting: General Surgery

## 2014-11-15 DIAGNOSIS — Z853 Personal history of malignant neoplasm of breast: Secondary | ICD-10-CM | POA: Diagnosis not present

## 2014-11-15 DIAGNOSIS — C9110 Chronic lymphocytic leukemia of B-cell type not having achieved remission: Secondary | ICD-10-CM | POA: Diagnosis not present

## 2014-11-15 DIAGNOSIS — R590 Localized enlarged lymph nodes: Secondary | ICD-10-CM | POA: Diagnosis present

## 2014-11-15 DIAGNOSIS — Z87891 Personal history of nicotine dependence: Secondary | ICD-10-CM | POA: Diagnosis not present

## 2014-11-15 LAB — CBC WITH DIFFERENTIAL/PLATELET
Basophils Absolute: 0 10*3/uL (ref 0.0–0.1)
Basophils Relative: 0 % (ref 0–1)
Eosinophils Absolute: 0.1 10*3/uL (ref 0.0–0.7)
Eosinophils Relative: 2 % (ref 0–5)
HCT: 41.2 % (ref 36.0–46.0)
Hemoglobin: 13.3 g/dL (ref 12.0–15.0)
Lymphocytes Relative: 52 % — ABNORMAL HIGH (ref 12–46)
Lymphs Abs: 3.8 10*3/uL (ref 0.7–4.0)
MCH: 30.6 pg (ref 26.0–34.0)
MCHC: 32.3 g/dL (ref 30.0–36.0)
MCV: 94.7 fL (ref 78.0–100.0)
Monocytes Absolute: 0.7 10*3/uL (ref 0.1–1.0)
Monocytes Relative: 10 % (ref 3–12)
Neutro Abs: 2.7 10*3/uL (ref 1.7–7.7)
Neutrophils Relative %: 36 % — ABNORMAL LOW (ref 43–77)
Platelets: 151 10*3/uL (ref 150–400)
RBC: 4.35 MIL/uL (ref 3.87–5.11)
RDW: 13.5 % (ref 11.5–15.5)
WBC: 7.3 10*3/uL (ref 4.0–10.5)

## 2014-11-15 LAB — PROTIME-INR
INR: 1.1 (ref 0.00–1.49)
Prothrombin Time: 14.4 seconds (ref 11.6–15.2)

## 2014-11-15 NOTE — Pre-Procedure Instructions (Signed)
Noticed Dr Zella Richer ordered labwork after our pre op call nurse spoke to patient. Verified lab order with Dr Zella Richer through his office staff. Called patient and informed her she needed to come today before 4 if at all possible. She said she could.

## 2014-11-16 ENCOUNTER — Other Ambulatory Visit (HOSPITAL_COMMUNITY)
Admission: RE | Admit: 2014-11-16 | Discharge: 2014-11-16 | Disposition: A | Discharge status: Home / Self Care | Payer: Private Health Insurance - Indemnity | Source: Ambulatory Visit | Attending: Hematology and Oncology | Admitting: Hematology and Oncology

## 2014-11-16 ENCOUNTER — Ambulatory Visit (HOSPITAL_BASED_OUTPATIENT_CLINIC_OR_DEPARTMENT_OTHER): Payer: Medicare Other | Admitting: Anesthesiology

## 2014-11-16 ENCOUNTER — Ambulatory Visit (HOSPITAL_BASED_OUTPATIENT_CLINIC_OR_DEPARTMENT_OTHER)
Admission: RE | Admit: 2014-11-16 | Discharge: 2014-11-16 | Disposition: A | Discharge status: Home / Self Care | Payer: Medicare Other | Source: Ambulatory Visit | Attending: General Surgery | Admitting: General Surgery

## 2014-11-16 ENCOUNTER — Encounter (HOSPITAL_BASED_OUTPATIENT_CLINIC_OR_DEPARTMENT_OTHER)
Admission: RE | Disposition: A | Discharge status: Home / Self Care | Payer: Self-pay | Source: Ambulatory Visit | Attending: General Surgery

## 2014-11-16 DIAGNOSIS — Z853 Personal history of malignant neoplasm of breast: Secondary | ICD-10-CM | POA: Insufficient documentation

## 2014-11-16 DIAGNOSIS — Z87891 Personal history of nicotine dependence: Secondary | ICD-10-CM | POA: Diagnosis not present

## 2014-11-16 DIAGNOSIS — C9110 Chronic lymphocytic leukemia of B-cell type not having achieved remission: Secondary | ICD-10-CM | POA: Insufficient documentation

## 2014-11-16 DIAGNOSIS — R591 Generalized enlarged lymph nodes: Secondary | ICD-10-CM | POA: Diagnosis not present

## 2014-11-16 DIAGNOSIS — C8591 Non-Hodgkin lymphoma, unspecified, lymph nodes of head, face, and neck: Secondary | ICD-10-CM | POA: Diagnosis not present

## 2014-11-16 DIAGNOSIS — C8581 Other specified types of non-Hodgkin lymphoma, lymph nodes of head, face, and neck: Secondary | ICD-10-CM | POA: Diagnosis not present

## 2014-11-16 HISTORY — DX: Gastro-esophageal reflux disease without esophagitis: K21.9

## 2014-11-16 HISTORY — DX: Unspecified osteoarthritis, unspecified site: M19.90

## 2014-11-16 HISTORY — PX: LYMPH GLAND EXCISION: SHX13

## 2014-11-16 LAB — POCT I-STAT, CHEM 8
BUN: 20 mg/dL (ref 6–20)
Calcium, Ion: 1.22 mmol/L (ref 1.13–1.30)
Chloride: 104 mmol/L (ref 101–111)
Creatinine, Ser: 1 mg/dL (ref 0.44–1.00)
Glucose, Bld: 91 mg/dL (ref 65–99)
HCT: 42 % (ref 36.0–46.0)
Hemoglobin: 14.3 g/dL (ref 12.0–15.0)
Potassium: 4.2 mmol/L (ref 3.5–5.1)
Sodium: 141 mmol/L (ref 135–145)
TCO2: 25 mmol/L (ref 0–100)

## 2014-11-16 SURGERY — EXCISION, LYMPH NODE, CERVICAL
Anesthesia: Monitor Anesthesia Care | Site: Neck | Laterality: Right

## 2014-11-16 MED ORDER — SCOPOLAMINE 1 MG/3DAYS TD PT72: 1.0000 | MEDICATED_PATCH | Freq: Once | TRANSDERMAL | Status: DC | PRN

## 2014-11-16 MED ORDER — FENTANYL CITRATE (PF) 100 MCG/2ML IJ SOLN
50.0000 ug | INTRAMUSCULAR | Status: DC | PRN
  Administered 2014-11-16: 100 ug via INTRAVENOUS

## 2014-11-16 MED ORDER — PROMETHAZINE HCL 25 MG/ML IJ SOLN
6.2500 mg | INTRAMUSCULAR | Status: DC | PRN
Start: 2014-11-16 — End: 2014-11-16

## 2014-11-16 MED ORDER — LIDOCAINE HCL (CARDIAC) 20 MG/ML IV SOLN
INTRAVENOUS | Status: DC | PRN
  Administered 2014-11-16: 20 mg via INTRAVENOUS

## 2014-11-16 MED ORDER — FENTANYL CITRATE (PF) 100 MCG/2ML IJ SOLN
INTRAMUSCULAR | Status: AC
  Filled 2014-11-16: qty 4

## 2014-11-16 MED ORDER — LIDOCAINE HCL (CARDIAC) 20 MG/ML IV SOLN
INTRAVENOUS | Status: AC
  Filled 2014-11-16: qty 5

## 2014-11-16 MED ORDER — ONDANSETRON HCL 4 MG/2ML IJ SOLN
INTRAMUSCULAR | Status: AC
Start: 2014-11-16 — End: 2014-11-16
  Filled 2014-11-16: qty 2

## 2014-11-16 MED ORDER — VANCOMYCIN HCL IN DEXTROSE 500-5 MG/100ML-% IV SOLN
INTRAVENOUS | Status: AC
  Filled 2014-11-16: qty 100

## 2014-11-16 MED ORDER — HYDROCODONE-ACETAMINOPHEN 5-325 MG PO TABS: 1.0000 | ORAL_TABLET | Freq: Four times a day (QID) | ORAL | Status: DC | PRN

## 2014-11-16 MED ORDER — PROPOFOL 500 MG/50ML IV EMUL
INTRAVENOUS | Status: DC | PRN
  Administered 2014-11-16: 25 ug/kg/min via INTRAVENOUS

## 2014-11-16 MED ORDER — ONDANSETRON HCL 4 MG/2ML IJ SOLN
INTRAMUSCULAR | Status: DC | PRN
  Administered 2014-11-16: 4 mg via INTRAVENOUS

## 2014-11-16 MED ORDER — LIDOCAINE-EPINEPHRINE (PF) 1 %-1:200000 IJ SOLN
INTRAMUSCULAR | Status: DC | PRN
  Administered 2014-11-16: 4 mL

## 2014-11-16 MED ORDER — BUPIVACAINE-EPINEPHRINE (PF) 0.5% -1:200000 IJ SOLN
INTRAMUSCULAR | Status: AC
  Filled 2014-11-16: qty 30

## 2014-11-16 MED ORDER — ACETAMINOPHEN 325 MG PO TABS: 650.0000 mg | ORAL_TABLET | ORAL | Status: DC | PRN

## 2014-11-16 MED ORDER — VANCOMYCIN HCL IN DEXTROSE 1-5 GM/200ML-% IV SOLN
1000.0000 mg | INTRAVENOUS | Status: AC
  Administered 2014-11-16: 1000 mg via INTRAVENOUS

## 2014-11-16 MED ORDER — LACTATED RINGERS IV SOLN
INTRAVENOUS | Status: DC
  Administered 2014-11-16: 07:00:00 via INTRAVENOUS

## 2014-11-16 MED ORDER — SODIUM BICARBONATE 4 % IV SOLN
INTRAVENOUS | Status: DC | PRN
  Administered 2014-11-16: 2 mL

## 2014-11-16 MED ORDER — MIDAZOLAM HCL 2 MG/2ML IJ SOLN: 1.0000 mg | INTRAMUSCULAR | Status: DC | PRN

## 2014-11-16 MED ORDER — GLYCOPYRROLATE 0.2 MG/ML IJ SOLN: 0.2000 mg | Freq: Once | INTRAMUSCULAR | Status: DC | PRN

## 2014-11-16 MED ORDER — SODIUM BICARBONATE 4 % IV SOLN
INTRAVENOUS | Status: AC
Start: 2014-11-16 — End: 2014-11-16
  Filled 2014-11-16: qty 5

## 2014-11-16 MED ORDER — BUPIVACAINE HCL (PF) 0.5 % IJ SOLN
INTRAMUSCULAR | Status: DC | PRN
  Administered 2014-11-16: 4 mL

## 2014-11-16 MED ORDER — FENTANYL CITRATE (PF) 100 MCG/2ML IJ SOLN: 25.0000 ug | INTRAMUSCULAR | Status: DC | PRN

## 2014-11-16 MED ORDER — BUPIVACAINE HCL (PF) 0.5 % IJ SOLN
INTRAMUSCULAR | Status: AC
  Filled 2014-11-16: qty 30

## 2014-11-16 MED ORDER — ACETAMINOPHEN 650 MG RE SUPP: 650.0000 mg | RECTAL | Status: DC | PRN

## 2014-11-16 MED ORDER — SODIUM CHLORIDE 0.9 % IJ SOLN: 3.0000 mL | INTRAMUSCULAR | Status: DC | PRN

## 2014-11-16 SURGICAL SUPPLY — 40 items
APL SKNCLS STERI-STRIP NONHPOA (GAUZE/BANDAGES/DRESSINGS) ×1
BENZOIN TINCTURE PRP APPL 2/3 (GAUZE/BANDAGES/DRESSINGS) ×1 IMPLANT
BLADE CLIPPER SURG (BLADE) IMPLANT
BLADE SURG 10 STRL SS (BLADE) ×2 IMPLANT
BLADE SURG 15 STRL LF DISP TIS (BLADE) IMPLANT
BLADE SURG 15 STRL SS (BLADE) ×2
CANISTER SUCT 1200ML W/VALVE (MISCELLANEOUS) IMPLANT
CHLORAPREP W/TINT 26ML (MISCELLANEOUS) ×2 IMPLANT
CLEANER CAUTERY TIP 5X5 PAD (MISCELLANEOUS) ×1 IMPLANT
COVER BACK TABLE 60X90IN (DRAPES) ×2 IMPLANT
COVER MAYO STAND STRL (DRAPES) ×2 IMPLANT
DECANTER SPIKE VIAL GLASS SM (MISCELLANEOUS) IMPLANT
DRAPE LAPAROTOMY 100X72 PEDS (DRAPES) ×2 IMPLANT
DRSG TEGADERM 4X4.75 (GAUZE/BANDAGES/DRESSINGS) IMPLANT
ELECT REM PT RETURN 9FT ADLT (ELECTROSURGICAL) ×2
ELECTRODE REM PT RTRN 9FT ADLT (ELECTROSURGICAL) ×1 IMPLANT
GAUZE SPONGE 4X4 16PLY XRAY LF (GAUZE/BANDAGES/DRESSINGS) IMPLANT
GLOVE BIOGEL PI IND STRL 8.5 (GLOVE) ×1 IMPLANT
GLOVE BIOGEL PI INDICATOR 8.5 (GLOVE) ×1
GLOVE ECLIPSE 8.0 STRL XLNG CF (GLOVE) ×2 IMPLANT
GOWN STRL REUS W/ TWL LRG LVL3 (GOWN DISPOSABLE) ×2 IMPLANT
GOWN STRL REUS W/TWL LRG LVL3 (GOWN DISPOSABLE) ×4
LIQUID BAND (GAUZE/BANDAGES/DRESSINGS) ×1 IMPLANT
NDL HYPO 25X1 1.5 SAFETY (NEEDLE) ×1 IMPLANT
NEEDLE HYPO 25X1 1.5 SAFETY (NEEDLE) ×2 IMPLANT
NS IRRIG 1000ML POUR BTL (IV SOLUTION) ×1 IMPLANT
PACK BASIN DAY SURGERY FS (CUSTOM PROCEDURE TRAY) ×2 IMPLANT
PAD CLEANER CAUTERY TIP 5X5 (MISCELLANEOUS) ×1
PENCIL BUTTON HOLSTER BLD 10FT (ELECTRODE) ×2 IMPLANT
SPONGE GAUZE 4X4 12PLY STER LF (GAUZE/BANDAGES/DRESSINGS) IMPLANT
STRIP CLOSURE SKIN 1/2X4 (GAUZE/BANDAGES/DRESSINGS) ×1 IMPLANT
SUT MON AB 4-0 PC3 18 (SUTURE) IMPLANT
SUT PROLENE 2 0 CT2 30 (SUTURE) IMPLANT
SUT VIC AB 4-0 SH 18 (SUTURE) IMPLANT
SUT VIC AB 4-0 SH 27 (SUTURE)
SUT VIC AB 4-0 SH 27XANBCTRL (SUTURE) IMPLANT
SYR CONTROL 10ML LL (SYRINGE) ×2 IMPLANT
TOWEL OR 17X24 6PK STRL BLUE (TOWEL DISPOSABLE) ×4 IMPLANT
TUBE CONNECTING 20X1/4 (TUBING) IMPLANT
YANKAUER SUCT BULB TIP NO VENT (SUCTIONS) IMPLANT

## 2014-11-16 NOTE — Discharge Instructions (Signed)
Light activity for 3 days. Resume normal activities if you are pain-free after that time.  Sit upright today. Apply ice to the area for the next 2-3 days.  Call if you have heavy bleeding or other wound problems.  You may shower tomorrow. Remove bandage in 3 days. Leave Steri-Strips on until they fall off.  Start out with a liquid diet first today. May have regular food at dinner time. Post Anesthesia Home Care Instructions  Activity: Get plenty of rest for the remainder of the day. A responsible adult should stay with you for 24 hours following the procedure.  For the next 24 hours, DO NOT: -Drive a car -Paediatric nurse -Drink alcoholic beverages -Take any medication unless instructed by your physician -Make any legal decisions or sign important papers.  Meals: Start with liquid foods such as gelatin or soup. Progress to regular foods as tolerated. Avoid greasy, spicy, heavy foods. If nausea and/or vomiting occur, drink only clear liquids until the nausea and/or vomiting subsides. Call your physician if vomiting continues.  Special Instructions/Symptoms: Your throat may feel dry or sore from the anesthesia or the breathing tube placed in your throat during surgery. If this causes discomfort, gargle with warm salt water. The discomfort should disappear within 24 hours.  If you had a scopolamine patch placed behind your ear for the management of post- operative nausea and/or vomiting:  1. The medication in the patch is effective for 72 hours, after which it should be removed.  Wrap patch in a tissue and discard in the trash. Wash hands thoroughly with soap and water. 2. You may remove the patch earlier than 72 hours if you experience unpleasant side effects which may include dry mouth, dizziness or visual disturbances. 3. Avoid touching the patch. Wash your hands with soap and water after contact with the patch.

## 2014-11-16 NOTE — H&P (View-Only) (Signed)
Brianna York DOB: 08/10/1934 Widowed / Language: Undefined / Race: Undefined Female  History of Present Illness Patient words: lipoma neck.  The patient is a 80 year old female   Note:She was referred by Dr. Joylene Draft because of cervical lymphadenopathy. She has a history of left-sided breast cancer. She had a upper respiratory infection recently. She noted an enlarged lymph node in the right posterior cervical triangle area. She was treated with a course of antibiotics but this lymph node persisted. CT scan demonstrated multiple visual lymph nodes all of which were less than 1 cm. She's been sent over here to discuss biopsy of the right cervical lymph node. No fevers. No night sweats. Slight decrease in energy level although she's been very busy with family matters.   Other Problems  Bladder Problems Breast Cancer Heart murmur High blood pressure Inguinal Hernia Lump In Breast  Past Surgical History Breast Biopsy Left. Breast Mass; Local Excision Left. Cataract Surgery Bilateral. Cesarean Section - Multiple Oral Surgery Sentinel Lymph Node Biopsy Tonsillectomy  Diagnostic Studies History Colonoscopy 1-5 years ago Mammogram within last year Pap Smear 1-5 years ago  Allergies  Ciprofloxacin *CHEMICALS* Loratadine *CHEMICALS* Penicillinm*Assorted Classes Tolterodine Tartrate *URINARY ANTISPASMODICS*  Medication History Anastrozole (1MG  Tablet, Oral) Active. Atorvastatin Calcium (10MG  Tablet, Oral) Active. ValACYclovir HCl (1GM Tablet, Oral) Active. Medications Reconciled  Social History  Alcohol use Occasional alcohol use. Caffeine use Coffee, Tea. No drug use Tobacco use Former smoker.  Family History  Arthritis Mother. Cancer Father, Mother. Diabetes Mellitus Brother.  Pregnancy / Birth HistoryAge at menarche 69 years. Age of menopause 58-50 Gravida 5 Maternal age 40-20 Para 5  Review of SystemsGeneral  Present- Appetite Loss and Weight Loss. Not Present- Chills, Fatigue, Fever, Night Sweats and Weight Gain. Skin Not Present- Change in Wart/Mole, Dryness, Hives, Jaundice, New Lesions, Non-Healing Wounds, Rash and Ulcer. HEENT Present- Seasonal Allergies and Wears glasses/contact lenses. Not Present- Earache, Hearing Loss, Hoarseness, Nose Bleed, Oral Ulcers, Ringing in the Ears, Sinus Pain, Sore Throat, Visual Disturbances and Yellow Eyes. Respiratory Not Present- Bloody sputum, Chronic Cough, Difficulty Breathing, Snoring and Wheezing. Cardiovascular Present- Leg Cramps. Not Present- Chest Pain, Difficulty Breathing Lying Down, Palpitations, Rapid Heart Rate, Shortness of Breath and Swelling of Extremities. Gastrointestinal Present- Indigestion. Not Present- Abdominal Pain, Bloating, Bloody Stool, Change in Bowel Habits, Chronic diarrhea, Constipation, Difficulty Swallowing, Excessive gas, Gets full quickly at meals, Hemorrhoids, Nausea, Rectal Pain and Vomiting. Female Genitourinary Present- Urgency. Not Present- Frequency, Nocturia, Painful Urination and Pelvic Pain. Musculoskeletal Present- Back Pain and Joint Pain. Not Present- Joint Stiffness, Muscle Pain, Muscle Weakness and Swelling of Extremities. Neurological Not Present- Decreased Memory, Fainting, Headaches, Numbness, Seizures, Tingling, Tremor, Trouble walking and Weakness. Psychiatric Not Present- Anxiety, Bipolar, Change in Sleep Pattern, Depression, Fearful and Frequent crying. Endocrine Not Present- Cold Intolerance, Excessive Hunger, Hair Changes, Heat Intolerance, Hot flashes and New Diabetes. Hematology Present- Easy Bruising and Gland problems. Not Present- Excessive bleeding, HIV and Persistent Infections.    Physical Exam General Note: General: WDWN elderly female in NAD. Pleasant and cooperative. A friend is with her.  HEENT: [place]/AT, no facial masses  NECK: Supple, no thyroid enlargement.  CV: RRR, no murmur, no  JVD.  CHEST: Breath sounds equal and clear. Respirations nonlabored.  ABDOMEN: Soft, nontender, lower midline scar.  MUSCULOSKELETAL: FROM, good muscle tone, no edema, no venous stasis changes  LYMPHATIC: 1 cm palpable right lateral cervical soft tissue mass.  NEUROLOGIC: Alert and oriented, answers questions appropriately.  PSYCHIATRIC: Normal mood, affect ,  and behavior.     Assessment & Plan  LYMPHADENOPATHY OF RIGHT CERVICAL REGION (785.6  R59.0) Impression: She has a history of left-sided breast cancer. Given the persistence of this enlarged lymph node which measures 1 cm on my exam, I recommended removal. The other option would be follow-up CT scan in 3-6 months. She is interested in having it removed.  Plan: Excisional biopsy of right cervical lymph node. We discussed the procedure and the risks. The risks include but are not limited to bleeding, infection, nerve injury, wound healing problems, anesthesia. She seems to understand and would like to proceed.  Jackolyn Confer, MD

## 2014-11-16 NOTE — Interval H&P Note (Signed)
History and Physical Interval Note:  11/16/2014 7:26 AM  Brianna York  has presented today for surgery, with the diagnosis of Right Cervical Lymphasenopathy  The various methods of treatment have been discussed with the patient and family. After consideration of risks, benefits and other options for treatment, the patient has consented to  Procedure(s): EXCISION AND BIOPSY OF RIGHT CERVICAL LYMPH NODE (Right) as a surgical intervention .  The patient's history has been reviewed, patient examined, no change in status, stable for surgery.  I have reviewed the patient's chart and labs.  Questions were answered to the patient's satisfaction.     Zeba Luby Lenna Sciara

## 2014-11-16 NOTE — Anesthesia Postprocedure Evaluation (Signed)
  Anesthesia Post-op Note  Patient: Brianna York  Procedure(s) Performed: Procedure(s) (LRB): EXCISION AND BIOPSY OF RIGHT CERVICAL LYMPH NODE (Right)  Patient Location: PACU  Anesthesia Type: General  Level of Consciousness: awake and alert   Airway and Oxygen Therapy: Patient Spontanous Breathing  Post-op Pain: mild  Post-op Assessment: Post-op Vital signs reviewed, Patient's Cardiovascular Status Stable, Respiratory Function Stable, Patent Airway and No signs of Nausea or vomiting  Last Vitals:  Filed Vitals:   11/16/14 0815  BP:   Pulse: 69  Temp:   Resp: 12    Post-op Vital Signs: stable   Complications: No apparent anesthesia complications

## 2014-11-16 NOTE — Anesthesia Preprocedure Evaluation (Signed)
Anesthesia Evaluation  Patient identified by MRN, date of birth, ID band Patient awake    Reviewed: Allergy & Precautions, NPO status , Patient's Chart, lab work & pertinent test results  Airway Mallampati: II  TM Distance: >3 FB Neck ROM: Full    Dental no notable dental hx.    Pulmonary neg pulmonary ROS, former smoker,  breath sounds clear to auscultation  Pulmonary exam normal       Cardiovascular negative cardio ROS Normal cardiovascular examRhythm:Regular Rate:Normal     Neuro/Psych negative neurological ROS  negative psych ROS   GI/Hepatic Neg liver ROS, GERD-  ,  Endo/Other  negative endocrine ROS  Renal/GU negative Renal ROS  negative genitourinary   Musculoskeletal negative musculoskeletal ROS (+)   Abdominal   Peds negative pediatric ROS (+)  Hematology negative hematology ROS (+)   Anesthesia Other Findings   Reproductive/Obstetrics negative OB ROS                            Anesthesia Physical Anesthesia Plan  ASA: II  Anesthesia Plan: MAC   Post-op Pain Management:    Induction: Intravenous  Airway Management Planned: Simple Face Mask  Additional Equipment:   Intra-op Plan:   Post-operative Plan:   Informed Consent: I have reviewed the patients History and Physical, chart, labs and discussed the procedure including the risks, benefits and alternatives for the proposed anesthesia with the patient or authorized representative who has indicated his/her understanding and acceptance.   Dental advisory given  Plan Discussed with: CRNA and Surgeon  Anesthesia Plan Comments:         Anesthesia Quick Evaluation  

## 2014-11-16 NOTE — Op Note (Signed)
Operative Note  KENTRELL GUETTLER female 80 y.o. 11/16/2014  PREOPERATIVE DX:  Right cervical lymphadenopathy in a woman with history of left breast cancer  POSTOPERATIVE DX:  Same  PROCEDURE:   Deep right cervical excisional lymph node biopsy         Surgeon: Odis Hollingshead   Assistants: none  Anesthesia: Monitored Local Anesthesia with Sedation  Indications:   This is a 80 year old female who is noted an enlarged right posterior cervical lymph node. She has a history of breast cancer. She was treated with antibiotics thinking was reactive to an upper respiratory infection she had but this has persisted. She now presents for the above procedure.    Procedure Detail:  She was seen in the holding room, examined, and the right neck marked with my initials. She is brought to the operative room placed supine on the operating table and given intravenous sedation. The right neck was sterilely prepped and draped. Lymph node was palpated.  Local anesthetic consisting of a mixture of Xylocaine and Marcaine was infiltrated in the lateral right neck and a transverse incision is made through the skin and subcutaneous tissue. Platysma was divided. I palpated the lymph node. Using blunt dissection I was able to identify the lymph node and delivered it up into the wound. Using the cautery I completed the excisional biopsy. The node was placed in saline and sent to pathology.  The wound was inspected and hemostasis was adequate. The subcutaneous tissues closed with running 4-0 Vicryl suture. The skin was closed with a 4-0 Monocryl subcuticular stitch. Steri-Strips and sterile dressing applied.  She tolerated the procedure without apparent complications and was taken to recovery room in satisfactory condition.    Estimated Blood Loss:  less than 50 mL                Specimens: Right cervical lymph node        Complications:  * No complications entered in OR log *         Disposition: PACU -  hemodynamically stable.         Condition: stable

## 2014-11-16 NOTE — Transfer of Care (Signed)
Immediate Anesthesia Transfer of Care Note  Patient: Brianna York  Procedure(s) Performed: Procedure(s): EXCISION AND BIOPSY OF RIGHT CERVICAL LYMPH NODE (Right)  Patient Location: PACU  Anesthesia Type:MAC  Level of Consciousness: awake and patient cooperative  Airway & Oxygen Therapy: Patient Spontanous Breathing and Patient connected to face mask oxygen  Post-op Assessment: Report given to RN and Post -op Vital signs reviewed and stable  Post vital signs: Reviewed and stable  Last Vitals:  Filed Vitals:   11/16/14 0809  BP:   Pulse: 71  Temp:   Resp: 14    Complications: No apparent anesthesia complications

## 2014-11-20 ENCOUNTER — Encounter (HOSPITAL_BASED_OUTPATIENT_CLINIC_OR_DEPARTMENT_OTHER): Payer: Self-pay | Admitting: General Surgery

## 2014-11-26 ENCOUNTER — Encounter: Payer: Self-pay | Admitting: General Surgery

## 2014-11-26 NOTE — Progress Notes (Unsigned)
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  I discussed this with her and she was able to look it up as well.  Well inform Drs. Gudena and Perini of this.

## 2014-11-27 ENCOUNTER — Telehealth: Payer: Self-pay | Admitting: *Deleted

## 2014-11-27 ENCOUNTER — Encounter: Payer: Self-pay | Admitting: Hematology and Oncology

## 2014-11-27 ENCOUNTER — Telehealth: Payer: Self-pay | Admitting: Hematology and Oncology

## 2014-11-27 ENCOUNTER — Other Ambulatory Visit: Payer: Self-pay

## 2014-11-27 NOTE — Telephone Encounter (Signed)
Added 32min f/u appointment with VG for 9/15. Date/time/duration/patient aware per 9/13 pof. No other orders per 9/13 pof.

## 2014-11-27 NOTE — Telephone Encounter (Signed)
PT. WOULD LIKE TO SEE DR.GUDENA EARLIER THAN 04/08/15.

## 2014-11-29 ENCOUNTER — Encounter: Payer: Self-pay | Admitting: Hematology and Oncology

## 2014-11-29 ENCOUNTER — Ambulatory Visit (HOSPITAL_BASED_OUTPATIENT_CLINIC_OR_DEPARTMENT_OTHER): Payer: Medicare Other | Admitting: Hematology and Oncology

## 2014-11-29 VITALS — BP 148/62 | HR 102 | Temp 98.1°F | Resp 17 | Ht 60.0 in | Wt 141.1 lb

## 2014-11-29 DIAGNOSIS — C50412 Malignant neoplasm of upper-outer quadrant of left female breast: Secondary | ICD-10-CM | POA: Diagnosis not present

## 2014-11-29 DIAGNOSIS — C8300 Small cell B-cell lymphoma, unspecified site: Secondary | ICD-10-CM | POA: Insufficient documentation

## 2014-11-29 NOTE — Assessment & Plan Note (Addendum)
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  Arimidex toxicities: Denies any hot flashes or muscle aches or pains. Osteopenia: On Fosamax with calcium and vitamin D Breast cancer surveillance:  1. Breast exam 04/05/2014 is normal,  2. mammograms done 12/12/2013 normal  Return to clinic in 1 year for follow-up.

## 2014-11-29 NOTE — Assessment & Plan Note (Signed)
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 She underwent lymph node biopsy 11/16/2014 which came back as SLL/CLL.  Pathology review: I discussed the pathophysiology of CLL/SLL in great detail and explained to her the spectrum of CLL from a benign indolent disease to an aggressive form. Patient is completely asymptomatic and hence I considered her to have an indolent form of CLL. Based on the lymph node enlargement, I believe she has stage I CLL/SLL.  Prognosis: I discussed with her that the prognosis of CLL would depend on stage, anemia pancytopenia, molecular studies of CLL. We will request pathology to send for CLL prognostic panel on the lymph node biopsy.  I reviewed her blood work which does not show any evidence of anemia or thrombocytopenia. She does have mild lymphocytosis of 3.6. This by itself would have suggested monoclonal B-cell lymphocytosis. One needs to have more than 5000 lymphocytes to be called to CLL.  Plan: 1. Recheck blood counts in January 2017 when she comes back to follow up. 2. I reassured her indolent nature of CLL/SLL.  Indications for treatment are 1. Rapidly progressive lymphadenopathy 2. B symptoms which include fever chills night sweats weight loss and painful lymph nodes 3. Rapid doubling of CLL lymphocytosis 4. Presence of anemia and thrombocytopenia related to CLL  Patient understands entire process and was very happy to hear that she does not need to take any treatment.

## 2014-11-29 NOTE — Progress Notes (Signed)
Patient Care Team: [name], MD as PCP - General (Internal Medicine)  DIAGNOSIS: No matching staging information was found for the patient.  SUMMARY OF ONCOLOGIC HISTORY:   Breast cancer of upper-outer quadrant of left female breast   09/16/2010 Surgery Left breast 1.5 cm invasive ductal carcinoma with calcifications, grade 1, perineural invasion was seen, DCIS, margins negative, 2 sentinel nodes negative, ER 99%, PR negative, Ki-67 79%, HER-2 negative, Oncotype 24, 16% ROR   10/25/2010 -  Anti-estrogen oral therapy Arimidex 1 mg daily developed side effects and was held briefly and restarted    Lymphoma, small lymphocytic   11/16/2014 Initial Diagnosis Lymphoma, small lymphocytic: Based on cervical lymph node biopsy CD5 positive CD10 negative    CHIEF COMPLIANT: Newly diagnosed CLL/SLL  INTERVAL HISTORY: Brianna York is a 80 year old with above-mentioned history of left breast cancer who recently presented with cervical adenopathy in June 2016. She initially had a CT of the neck as well as CT of the chest. She primarily had small cervical lymph nodes. She was seen by Dr. Elinor Parkinson who performed a cervical lymph node excision. 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 Patient does not have any symptoms related to the SLL other than the fact that she had palpable cervical lymph nodes. Based on CT scan and these were fairly small the largest being 8 mm in size. She denies any fevers chills night sweats or weight loss.  REVIEW OF SYSTEMS:   Constitutional: Denies fevers, chills or abnormal weight loss Eyes: Denies blurriness of vision Ears, nose, mouth, throat, and face: Denies mucositis or sore throat Respiratory: Denies cough, dyspnea or wheezes Cardiovascular: Denies palpitation, chest discomfort or lower extremity swelling Gastrointestinal:  Denies nausea, heartburn or change in bowel habits Skin: Denies abnormal skin rashes Lymphatics: Cervical lymphadenopathy on the right  side Neurological:Denies numbness, tingling or new weaknesses Behavioral/Psych: Mood is stable, no new changes  Breast:  denies any pain or lumps or nodules in either breasts All other systems were reviewed with the patient and are negative.  I have reviewed the past medical history, past surgical history, social history and family history with the patient and they are unchanged from previous note.  ALLERGIES:  is allergic to ciprofloxacin; loratadine-pseudoephedrine er; penicillins; and tolterodine tartrate.  MEDICATIONS:  Current Outpatient Prescriptions  Medication Sig Dispense Refill  . alendronate (FOSAMAX) 70 MG tablet Take 70 mg by mouth once a week. Take with a full glass of water on an empty stomach.    Marland Kitchen anastrozole (ARIMIDEX) 1 MG tablet Take 1 tablet (1 mg total) by mouth daily. Take 1 mg by mouth daily. 90 tablet 3  . atorvastatin (LIPITOR) 10 MG tablet Take 10 mg by mouth daily.    . Vitamin D, Ergocalciferol, (DRISDOL) 50000 UNITS CAPS capsule Take 50,000 Units by mouth every 7 (seven) days.    Marland Kitchen HYDROcodone-acetaminophen (NORCO) 5-325 MG per tablet Take 1-2 tablets by mouth every 6 (six) hours as needed for moderate pain or severe pain. (Patient not taking: Reported on 11/29/2014) 30 tablet 0   No current facility-administered medications for this visit.    PHYSICAL EXAMINATION: ECOG PERFORMANCE STATUS: 1 - Symptomatic but completely ambulatory  Filed Vitals:   11/29/14 1541  BP: 148/62  Pulse: 102  Temp: 98.1 F (36.7 C)  Resp: 17   Filed Weights   11/29/14 1541  Weight: 141 lb 1.6 oz (64.003 kg)    GENERAL:alert, no distress and comfortable SKIN: skin color, texture, turgor are normal, no  rashes or significant lesions EYES: normal, Conjunctiva are pink and non-injected, sclera clear OROPHARYNX:no exudate, no erythema and lips, buccal mucosa, and tongue normal  NECK: Right cervical lymphadenopathy LYMPH:  Palpable cervical adenopathy LUNGS: clear to  auscultation and percussion with normal breathing effort HEART: regular rate & rhythm and no murmurs and no lower extremity edema ABDOMEN:abdomen soft, non-tender and normal bowel sounds Musculoskeletal:no cyanosis of digits and no clubbing  NEURO: alert & oriented x 3 with fluent speech, no focal motor/sensory deficits   LABORATORY DATA:  I have reviewed the data as listed   Chemistry      Component Value Date/Time   NA 141 11/15/2014 1514   NA 142 04/05/2014 0941   K 4.2 11/15/2014 1514   K 4.5 04/05/2014 0941   CL 104 11/15/2014 1514   CL 109* 08/10/2012 1426   CO2 27 04/05/2014 0941   CO2 27 06/22/2011 1035   BUN 20 11/15/2014 1514   BUN 13.9 04/05/2014 0941   CREATININE 1.00 11/15/2014 1514   CREATININE 0.9 04/05/2014 0941      Component Value Date/Time   CALCIUM 8.8 04/05/2014 0941   CALCIUM 9.4 06/22/2011 1035   ALKPHOS 63 04/05/2014 0941   ALKPHOS 58 06/22/2011 1035   AST 17 04/05/2014 0941   AST 19 06/22/2011 1035   ALT 12 04/05/2014 0941   ALT 17 06/22/2011 1035   BILITOT 0.55 04/05/2014 0941   BILITOT 0.4 06/22/2011 1035       Lab Results  Component Value Date   WBC 7.3 11/15/2014   HGB 14.3 11/15/2014   HCT 42.0 11/15/2014   MCV 94.7 11/15/2014   PLT 151 11/15/2014   NEUTROABS 2.7 11/15/2014   ASSESSMENT & PLAN:  Breast cancer of upper-outer quadrant of left female breast 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  Arimidex toxicities: Denies any hot flashes or muscle aches or pains. Osteopenia: On Fosamax with calcium and vitamin D Breast cancer surveillance:  1. Breast exam 04/05/2014 is normal,  2. 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 She underwent lymph node biopsy 11/16/2014 which came back as SLL/CLL.  Pathology review: I discussed the pathophysiology of CLL/SLL in great detail and explained to her the spectrum of CLL from a benign indolent disease to an aggressive form. Patient is completely asymptomatic and hence I considered her to have an indolent form of CLL. Based on the lymph node enlargement, I believe she has stage I CLL/SLL.  Prognosis: I discussed with her that the prognosis of CLL would depend on stage, anemia pancytopenia, molecular studies of CLL. We will request pathology to send for CLL prognostic panel on the lymph node biopsy.  I reviewed her blood work which does not show any evidence of anemia or thrombocytopenia. She does have mild lymphocytosis of 3.6. This by itself would have suggested monoclonal B-cell lymphocytosis. One needs to have more than 5000 lymphocytes to be called to CLL.  Plan: 1. Recheck blood counts in January 2017 when she comes back to follow up. 2. I reassured her indolent nature of CLL/SLL.  Indications for treatment are  1. Rapidly progressive lymphadenopathy 2. B symptoms which include fever chills night sweats weight loss and painful lymph nodes 3. Rapid doubling of CLL lymphocytosis 4. Presence of anemia and thrombocytopenia related to CLL  Patient understands entire process and was very happy to hear that she does not need to take any treatment.      No orders of the defined types were placed in this encounter.   The patient has a good understanding of the overall plan. she agrees with it. she will call with any problems that may develop before the next visit here.   Rulon Eisenmenger, MD

## 2014-12-04 DIAGNOSIS — Z23 Encounter for immunization: Secondary | ICD-10-CM | POA: Diagnosis not present

## 2014-12-12 ENCOUNTER — Other Ambulatory Visit: Payer: Self-pay | Admitting: Internal Medicine

## 2014-12-12 DIAGNOSIS — Z853 Personal history of malignant neoplasm of breast: Secondary | ICD-10-CM

## 2014-12-12 DIAGNOSIS — Z9889 Other specified postprocedural states: Secondary | ICD-10-CM

## 2014-12-12 LAB — TISSUE HYBRIDIZATION TO NCBH

## 2014-12-18 ENCOUNTER — Other Ambulatory Visit (HOSPITAL_COMMUNITY): Payer: Self-pay

## 2014-12-18 ENCOUNTER — Encounter (HOSPITAL_COMMUNITY): Payer: Self-pay

## 2014-12-19 ENCOUNTER — Telehealth: Payer: Self-pay | Admitting: Hematology and Oncology

## 2014-12-19 ENCOUNTER — Ambulatory Visit
Admission: RE | Admit: 2014-12-19 | Discharge: 2014-12-19 | Disposition: A | Discharge status: Home / Self Care | Payer: Medicare Other | Source: Ambulatory Visit | Attending: Internal Medicine | Admitting: Internal Medicine

## 2014-12-19 DIAGNOSIS — Z853 Personal history of malignant neoplasm of breast: Secondary | ICD-10-CM

## 2014-12-19 DIAGNOSIS — R928 Other abnormal and inconclusive findings on diagnostic imaging of breast: Secondary | ICD-10-CM | POA: Diagnosis not present

## 2014-12-19 DIAGNOSIS — Z9889 Other specified postprocedural states: Secondary | ICD-10-CM

## 2014-12-19 NOTE — Telephone Encounter (Signed)
Patient called in to reschedule her appointment °

## 2014-12-20 ENCOUNTER — Other Ambulatory Visit: Payer: Self-pay

## 2014-12-20 ENCOUNTER — Telehealth: Payer: Self-pay | Admitting: Hematology and Oncology

## 2014-12-20 DIAGNOSIS — C50412 Malignant neoplasm of upper-outer quadrant of left female breast: Secondary | ICD-10-CM

## 2014-12-20 NOTE — Telephone Encounter (Signed)
Called patient as she had called to check on needed labs prior to her appointment in 03/2015,yes per terri and a few days prior

## 2014-12-24 ENCOUNTER — Encounter: Payer: Self-pay | Admitting: *Deleted

## 2014-12-24 NOTE — Progress Notes (Signed)
Received labs from Cytogenetic lab at [facility], sent to scan.

## 2015-03-14 ENCOUNTER — Other Ambulatory Visit (HOSPITAL_BASED_OUTPATIENT_CLINIC_OR_DEPARTMENT_OTHER): Payer: Medicare Other

## 2015-03-14 DIAGNOSIS — C8300 Small cell B-cell lymphoma, unspecified site: Secondary | ICD-10-CM | POA: Diagnosis present

## 2015-03-14 LAB — CBC WITH DIFFERENTIAL/PLATELET
BASO%: 0.4 % (ref 0.0–2.0)
Basophils Absolute: 0 10*3/uL (ref 0.0–0.1)
EOS%: 2.5 % (ref 0.0–7.0)
Eosinophils Absolute: 0.2 10*3/uL (ref 0.0–0.5)
HCT: 41.6 % (ref 34.8–46.6)
HGB: 13.8 g/dL (ref 11.6–15.9)
LYMPH%: 48.7 % (ref 14.0–49.7)
MCH: 31.1 pg (ref 25.1–34.0)
MCHC: 33.2 g/dL (ref 31.5–36.0)
MCV: 93.7 fL (ref 79.5–101.0)
MONO#: 0.7 10*3/uL (ref 0.1–0.9)
MONO%: 9.1 % (ref 0.0–14.0)
NEUT#: 2.9 10*3/uL (ref 1.5–6.5)
NEUT%: 39.3 % (ref 38.4–76.8)
Platelets: 154 10*3/uL (ref 145–400)
RBC: 4.44 10*6/uL (ref 3.70–5.45)
RDW: 13.4 % (ref 11.2–14.5)
WBC: 7.3 10*3/uL (ref 3.9–10.3)
lymph#: 3.5 10*3/uL — ABNORMAL HIGH (ref 0.9–3.3)

## 2015-03-14 LAB — COMPREHENSIVE METABOLIC PANEL
ALT: 14 U/L (ref 0–55)
AST: 19 U/L (ref 5–34)
Albumin: 4 g/dL (ref 3.5–5.0)
Alkaline Phosphatase: 62 U/L (ref 40–150)
Anion Gap: 7 mEq/L (ref 3–11)
BUN: 19.2 mg/dL (ref 7.0–26.0)
CO2: 25 mEq/L (ref 22–29)
Calcium: 9 mg/dL (ref 8.4–10.4)
Chloride: 107 mEq/L (ref 98–109)
Creatinine: 1 mg/dL (ref 0.6–1.1)
EGFR: 53 mL/min/{1.73_m2} — ABNORMAL LOW (ref >90)
Glucose: 85 mg/dl (ref 70–140)
Potassium: 4.6 mEq/L (ref 3.5–5.1)
Sodium: 139 mEq/L (ref 136–145)
Total Bilirubin: 0.59 mg/dL (ref 0.20–1.20)
Total Protein: 6.6 g/dL (ref 6.4–8.3)

## 2015-03-14 LAB — LACTATE DEHYDROGENASE: LDH: 236 U/L (ref 125–245)

## 2015-03-19 ENCOUNTER — Encounter: Payer: Self-pay | Admitting: Hematology and Oncology

## 2015-03-19 ENCOUNTER — Ambulatory Visit (HOSPITAL_BASED_OUTPATIENT_CLINIC_OR_DEPARTMENT_OTHER): Payer: Medicare Other | Admitting: Hematology and Oncology

## 2015-03-19 VITALS — BP 135/57 | HR 78 | Temp 97.5°F | Resp 18 | Ht 60.0 in | Wt 142.0 lb

## 2015-03-19 DIAGNOSIS — C50412 Malignant neoplasm of upper-outer quadrant of left female breast: Secondary | ICD-10-CM | POA: Diagnosis not present

## 2015-03-19 DIAGNOSIS — C8300 Small cell B-cell lymphoma, unspecified site: Secondary | ICD-10-CM

## 2015-03-19 DIAGNOSIS — M810 Age-related osteoporosis without current pathological fracture: Secondary | ICD-10-CM | POA: Diagnosis not present

## 2015-03-19 NOTE — Assessment & Plan Note (Signed)
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 She underwent lymph node biopsy 11/16/2014 which came back as SLL/CLL.  Current treatment:  Watchful monitoring Indications for treatment are 1. Rapidly progressive lymphadenopathy 2. B symptoms which include fever chills night sweats weight loss and painful lymph nodes 3. Rapid doubling of CLL lymphocytosis 4. Presence of anemia and thrombocytopenia related to CLL

## 2015-03-19 NOTE — Progress Notes (Signed)
Patient Care Team: [name], MD as PCP - General (Internal Medicine)  DIAGNOSIS: No matching staging information was found for the patient.  SUMMARY OF ONCOLOGIC HISTORY:   Breast cancer of upper-outer quadrant of left female breast ([place])   09/16/2010 Surgery Left breast 1.5 cm invasive ductal carcinoma with calcifications, grade 1, perineural invasion was seen, DCIS, margins negative, 2 sentinel nodes negative, ER 99%, PR negative, Ki-67 79%, HER-2 negative, Oncotype 24, 16% ROR   10/25/2010 -  Anti-estrogen oral therapy Arimidex 1 mg daily developed side effects and was held briefly and restarted    Lymphoma, small lymphocytic ([place])   11/16/2014 Initial Diagnosis Lymphoma, small lymphocytic: Based on cervical lymph node biopsy CD5 positive CD10 negative    CHIEF COMPLIANT:  Follow-up of CLL/SLL and breast cancer  INTERVAL HISTORY: Brianna York is a  81 year old with above-mentioned history of CLL/SLL was here for three-month follow-up. She reports no new problems or concerns. She feels small nodules on the neck. These seem to come and go. Denies any fatigue or fever chills night sweats or weight loss.  She appears to be tolerating anastrozole extremely well for her breast cancer. She'll be completing 5 years as of August 2017. She is very excited that she will be spending the rest of the winter in [place].  REVIEW OF SYSTEMS:   Constitutional: Denies fevers, chills or abnormal weight loss Eyes: Denies blurriness of vision Ears, nose, mouth, throat, and face: Denies mucositis or sore throat Respiratory: Denies cough, dyspnea or wheezes Cardiovascular: Denies palpitation, chest discomfort Gastrointestinal:  Denies nausea, heartburn or change in bowel habits Skin: Denies abnormal skin rashes Lymphatics: Denies new lymphadenopathy or easy bruising Neurological:Denies numbness, tingling or new weaknesses Behavioral/Psych: Mood is stable, no new changes  Extremities: No lower  extremity edema Breast:  denies any pain or lumps or nodules in either breasts All other systems were reviewed with the patient and are negative.  I have reviewed the past medical history, past surgical history, social history and family history with the patient and they are unchanged from previous note.  ALLERGIES:  is allergic to ciprofloxacin; loratadine-pseudoephedrine er; penicillins; and tolterodine tartrate.  MEDICATIONS:  Current Outpatient Prescriptions  Medication Sig Dispense Refill  . alendronate (FOSAMAX) 70 MG tablet Take 70 mg by mouth once a week. Take with a full glass of water on an empty stomach.    Marland Kitchen anastrozole (ARIMIDEX) 1 MG tablet Take 1 tablet (1 mg total) by mouth daily. Take 1 mg by mouth daily. 90 tablet 3  . atorvastatin (LIPITOR) 10 MG tablet Take 10 mg by mouth daily.    Marland Kitchen HYDROcodone-acetaminophen (NORCO) 5-325 MG per tablet Take 1-2 tablets by mouth every 6 (six) hours as needed for moderate pain or severe pain. (Patient not taking: Reported on 11/29/2014) 30 tablet 0  . Vitamin D, Ergocalciferol, (DRISDOL) 50000 UNITS CAPS capsule Take 50,000 Units by mouth every 7 (seven) days.     No current facility-administered medications for this visit.    PHYSICAL EXAMINATION: ECOG PERFORMANCE STATUS: 1 - Symptomatic but completely ambulatory  Filed Vitals:   03/19/15 1101  BP: 135/57  Pulse: 78  Temp: 97.5 F (36.4 C)  Resp: 18   Filed Weights   03/19/15 1101  Weight: 142 lb (64.411 kg)    GENERAL:alert, no distress and comfortable SKIN: skin color, texture, turgor are normal, no rashes or significant lesions EYES: normal, Conjunctiva are pink and non-injected, sclera clear OROPHARYNX:no exudate, no erythema and lips, buccal  mucosa, and tongue normal  NECK:  Small lymph nodes palpable in the neck LYMPH:   Small cervical lymph nodes posteriorly LUNGS: clear to auscultation and percussion with normal breathing effort HEART: regular rate & rhythm and no  murmurs and no lower extremity edema ABDOMEN:abdomen soft, non-tender and normal bowel sounds MUSCULOSKELETAL:no cyanosis of digits and no clubbing  NEURO: alert & oriented x 3 with fluent speech, no focal motor/sensory deficits EXTREMITIES: No lower extremity edema   LABORATORY DATA:  I have reviewed the data as listed   Chemistry      Component Value Date/Time   NA 139 03/14/2015 1143   NA 141 11/15/2014 1514   K 4.6 03/14/2015 1143   K 4.2 11/15/2014 1514   CL 104 11/15/2014 1514   CL 109* 08/10/2012 1426   CO2 25 03/14/2015 1143   CO2 27 06/22/2011 1035   BUN 19.2 03/14/2015 1143   BUN 20 11/15/2014 1514   CREATININE 1.0 03/14/2015 1143   CREATININE 1.00 11/15/2014 1514      Component Value Date/Time   CALCIUM 9.0 03/14/2015 1143   CALCIUM 9.4 06/22/2011 1035   ALKPHOS 62 03/14/2015 1143   ALKPHOS 58 06/22/2011 1035   AST 19 03/14/2015 1143   AST 19 06/22/2011 1035   ALT 14 03/14/2015 1143   ALT 17 06/22/2011 1035   BILITOT 0.59 03/14/2015 1143   BILITOT 0.4 06/22/2011 1035       Lab Results  Component Value Date   WBC 7.3 03/14/2015   HGB 13.8 03/14/2015   HCT 41.6 03/14/2015   MCV 93.7 03/14/2015   PLT 154 03/14/2015   NEUTROABS 2.9 03/14/2015     ASSESSMENT & PLAN:  Breast cancer of upper-outer quadrant of left female breast 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  Arimidex toxicities: Denies any hot flashes or muscle aches or pains.  I discussed the pros and cons of continuing Arimidex beyond 5 years versus stopping it after the completion of 5 years of therapy. There does not appear to be any survival advantage to continuing it beyond 5 years.   Patient will stop this treatment in August 2017  Osteopenia: On Fosamax with calcium and vitamin D Breast cancer surveillance:   1. Breast exam 03/19/2015 is normal,  2. 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 She underwent lymph node biopsy 11/16/2014 which came back as SLL/CLL.  Current treatment:  Watchful monitoring Indications for treatment are 1. Rapidly progressive lymphadenopathy 2. B symptoms which include fever chills night sweats weight loss and painful lymph nodes 3. Rapid doubling of CLL lymphocytosis 4. Presence of anemia and thrombocytopenia related to CLL  Return to clinic in 6 months for follow-up. We will not do blood work since she will have had an annual exam by Dr. Joylene Draft in May 2017. We will request the lab work.  No orders of the defined types were placed in this encounter.   The patient has a good understanding of the overall plan. she agrees with it. she will call with any problems that may develop before  the next visit here.   Rulon Eisenmenger, MD 03/19/2015

## 2015-03-19 NOTE — Assessment & Plan Note (Signed)
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  Arimidex toxicities: Denies any hot flashes or muscle aches or pains.  I discussed the pros and cons of continuing Arimidex beyond 5 years versus stopping it after the completion of 5 years of therapy. There does not appear to be any survival advantage to continuing it beyond 5 years.  There is probably a slight decrease in the recurrence free survival. One option would be to consider sending for breast cancer index.  Osteopenia: On Fosamax with calcium and vitamin D Breast cancer surveillance:  1. Breast exam 03/19/2015 is normal,  2. mammograms done  12/19/2014 normal , breast density category B

## 2015-04-08 ENCOUNTER — Ambulatory Visit: Payer: Medicare Other | Admitting: Hematology and Oncology

## 2015-05-07 ENCOUNTER — Encounter: Payer: Self-pay | Admitting: Gastroenterology

## 2015-07-15 DIAGNOSIS — R42 Dizziness and giddiness: Secondary | ICD-10-CM | POA: Diagnosis not present

## 2015-07-15 DIAGNOSIS — Z6828 Body mass index (BMI) 28.0-28.9, adult: Secondary | ICD-10-CM | POA: Diagnosis not present

## 2015-07-18 ENCOUNTER — Other Ambulatory Visit: Payer: Self-pay | Admitting: Internal Medicine

## 2015-07-18 DIAGNOSIS — R51 Headache: Secondary | ICD-10-CM

## 2015-07-18 DIAGNOSIS — R4182 Altered mental status, unspecified: Secondary | ICD-10-CM

## 2015-07-18 DIAGNOSIS — R519 Headache, unspecified: Secondary | ICD-10-CM

## 2015-07-19 ENCOUNTER — Ambulatory Visit
Admission: RE | Admit: 2015-07-19 | Discharge: 2015-07-19 | Disposition: A | Discharge status: Home / Self Care | Payer: Medicare Other | Source: Ambulatory Visit | Attending: Internal Medicine | Admitting: Internal Medicine

## 2015-07-19 DIAGNOSIS — R51 Headache: Secondary | ICD-10-CM

## 2015-07-19 DIAGNOSIS — R4182 Altered mental status, unspecified: Secondary | ICD-10-CM

## 2015-07-19 DIAGNOSIS — R519 Headache, unspecified: Secondary | ICD-10-CM

## 2015-07-30 DIAGNOSIS — K219 Gastro-esophageal reflux disease without esophagitis: Secondary | ICD-10-CM | POA: Diagnosis not present

## 2015-07-30 DIAGNOSIS — E559 Vitamin D deficiency, unspecified: Secondary | ICD-10-CM | POA: Diagnosis not present

## 2015-07-30 DIAGNOSIS — M859 Disorder of bone density and structure, unspecified: Secondary | ICD-10-CM | POA: Diagnosis not present

## 2015-07-30 DIAGNOSIS — R8299 Other abnormal findings in urine: Secondary | ICD-10-CM | POA: Diagnosis not present

## 2015-07-30 DIAGNOSIS — N390 Urinary tract infection, site not specified: Secondary | ICD-10-CM | POA: Diagnosis not present

## 2015-07-30 DIAGNOSIS — E784 Other hyperlipidemia: Secondary | ICD-10-CM | POA: Diagnosis not present

## 2015-08-07 DIAGNOSIS — N318 Other neuromuscular dysfunction of bladder: Secondary | ICD-10-CM | POA: Diagnosis not present

## 2015-08-07 DIAGNOSIS — C50919 Malignant neoplasm of unspecified site of unspecified female breast: Secondary | ICD-10-CM | POA: Diagnosis not present

## 2015-08-07 DIAGNOSIS — M549 Dorsalgia, unspecified: Secondary | ICD-10-CM | POA: Diagnosis not present

## 2015-08-07 DIAGNOSIS — Z1389 Encounter for screening for other disorder: Secondary | ICD-10-CM | POA: Diagnosis not present

## 2015-08-07 DIAGNOSIS — B009 Herpesviral infection, unspecified: Secondary | ICD-10-CM | POA: Diagnosis not present

## 2015-08-07 DIAGNOSIS — K219 Gastro-esophageal reflux disease without esophagitis: Secondary | ICD-10-CM | POA: Diagnosis not present

## 2015-08-07 DIAGNOSIS — R599 Enlarged lymph nodes, unspecified: Secondary | ICD-10-CM | POA: Diagnosis not present

## 2015-08-07 DIAGNOSIS — F419 Anxiety disorder, unspecified: Secondary | ICD-10-CM | POA: Diagnosis not present

## 2015-08-07 DIAGNOSIS — Z6828 Body mass index (BMI) 28.0-28.9, adult: Secondary | ICD-10-CM | POA: Diagnosis not present

## 2015-08-07 DIAGNOSIS — E784 Other hyperlipidemia: Secondary | ICD-10-CM | POA: Diagnosis not present

## 2015-08-07 DIAGNOSIS — Z0000 Encounter for general adult medical examination without abnormal findings: Secondary | ICD-10-CM | POA: Diagnosis not present

## 2015-08-07 DIAGNOSIS — H9190 Unspecified hearing loss, unspecified ear: Secondary | ICD-10-CM | POA: Diagnosis not present

## 2015-09-23 ENCOUNTER — Ambulatory Visit (HOSPITAL_BASED_OUTPATIENT_CLINIC_OR_DEPARTMENT_OTHER): Payer: Medicare Other | Admitting: Hematology and Oncology

## 2015-09-23 ENCOUNTER — Telehealth: Payer: Self-pay | Admitting: Hematology and Oncology

## 2015-09-23 ENCOUNTER — Encounter: Payer: Self-pay | Admitting: Hematology and Oncology

## 2015-09-23 VITALS — BP 130/66 | HR 66 | Temp 97.6°F | Resp 18 | Ht 60.0 in | Wt 139.8 lb

## 2015-09-23 DIAGNOSIS — C8300 Small cell B-cell lymphoma, unspecified site: Secondary | ICD-10-CM | POA: Diagnosis not present

## 2015-09-23 DIAGNOSIS — M859 Disorder of bone density and structure, unspecified: Secondary | ICD-10-CM | POA: Diagnosis not present

## 2015-09-23 DIAGNOSIS — C50412 Malignant neoplasm of upper-outer quadrant of left female breast: Secondary | ICD-10-CM

## 2015-09-23 NOTE — Assessment & Plan Note (Signed)
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  Arimidex toxicities: Denies any hot flashes or muscle aches or pains. I discussed the pros and cons of continuing Arimidex beyond 5 years versus stopping it after the completion of 5 years of therapy. I recommended sending for breast cancer index to determine if she would benefit from extended adjuvant therapy. Patient will complete 5 years of this treatment in August 2017.  Osteopenia: On Fosamax with calcium and vitamin D Breast cancer surveillance:  1. Breast exam 09/23/2015 is normal,  2. mammograms done 12/19/2014 normal , breast density category B

## 2015-09-23 NOTE — Telephone Encounter (Signed)
appt made and avs printed °

## 2015-09-23 NOTE — Progress Notes (Signed)
Patient Care Team: [name], MD as PCP - General (Internal Medicine)  DIAGNOSIS: Breast cancer and SLL  SUMMARY OF ONCOLOGIC HISTORY:   Breast cancer of upper-outer quadrant of left female breast ([place])   09/16/2010 Surgery Left breast 1.5 cm invasive ductal carcinoma with calcifications, grade 1, perineural invasion was seen, DCIS, margins negative, 2 sentinel nodes negative, ER 99%, PR negative, Ki-67 79%, HER-2 negative, Oncotype 24, 16% ROR   10/25/2010 -  Anti-estrogen oral therapy Arimidex 1 mg daily developed side effects and was held briefly and restarted    Lymphoma, small lymphocytic ([place])   11/16/2014 Initial Diagnosis Lymphoma, small lymphocytic: Based on cervical lymph node biopsy CD5 positive CD10 negative    CHIEF COMPLIANT: Follow-up of left breast cancer and SLL  INTERVAL HISTORY: Brianna York is a 81 year old with above-mentioned history of left breast cancer treated with surgery followed by antiestrogen therapy with Arimidex since August 2012. She completed 5 years of therapy and discontinued the medication. She reports that her health has been fair. She is going to be 81 years old in January.  REVIEW OF SYSTEMS:   Constitutional: Denies fevers, chills or abnormal weight loss Eyes: Denies blurriness of vision Ears, nose, mouth, throat, and face: Denies mucositis or sore throat Respiratory: Denies cough, dyspnea or wheezes Cardiovascular: Denies palpitation, chest discomfort Gastrointestinal:  Denies nausea, heartburn or change in bowel habits Skin: Denies abnormal skin rashes Lymphatics: Denies new lymphadenopathy or easy bruising Neurological:Denies numbness, tingling or new weaknesses Behavioral/Psych: Mood is stable, no new changes  Extremities: No lower extremity edema Breast:  denies any pain or lumps or nodules in either breasts All other systems were reviewed with the patient and are negative.  I have reviewed the past medical history, past surgical  history, social history and family history with the patient and they are unchanged from previous note.  ALLERGIES:  is allergic to ciprofloxacin; loratadine-pseudoephedrine er; penicillins; and tolterodine tartrate.  MEDICATIONS:  Current Outpatient Prescriptions  Medication Sig Dispense Refill  . alendronate (FOSAMAX) 70 MG tablet Take 70 mg by mouth once a week. Take with a full glass of water on an empty stomach.    Marland Kitchen anastrozole (ARIMIDEX) 1 MG tablet Take 1 tablet (1 mg total) by mouth daily. Take 1 mg by mouth daily. 90 tablet 3  . atorvastatin (LIPITOR) 10 MG tablet Take 10 mg by mouth daily.    Marland Kitchen HYDROcodone-acetaminophen (NORCO) 5-325 MG per tablet Take 1-2 tablets by mouth every 6 (six) hours as needed for moderate pain or severe pain. (Patient not taking: Reported on 11/29/2014) 30 tablet 0  . Vitamin D, Ergocalciferol, (DRISDOL) 50000 UNITS CAPS capsule Take 50,000 Units by mouth every 7 (seven) days.     No current facility-administered medications for this visit.    PHYSICAL EXAMINATION: ECOG PERFORMANCE STATUS: 0 - Asymptomatic  Filed Vitals:   09/23/15 1013  BP: 130/66  Pulse: 66  Temp: 97.6 F (36.4 C)  Resp: 18   Filed Weights   09/23/15 1013  Weight: 139 lb 12.8 oz (63.413 kg)    GENERAL:alert, no distress and comfortable SKIN: skin color, texture, turgor are normal, no rashes or significant lesions EYES: normal, Conjunctiva are pink and non-injected, sclera clear OROPHARYNX:no exudate, no erythema and lips, buccal mucosa, and tongue normal  NECK: supple, thyroid normal size, non-tender, without nodularity LYMPH:  no palpable lymphadenopathy in the cervical, axillary or inguinal LUNGS: clear to auscultation and percussion with normal breathing effort HEART: regular rate & rhythm  and no murmurs and no lower extremity edema ABDOMEN:abdomen soft, non-tender and normal bowel sounds MUSCULOSKELETAL:no cyanosis of digits and no clubbing  NEURO: alert & oriented x  3 with fluent speech, no focal motor/sensory deficits EXTREMITIES: No lower extremity edema BREAST: No palpable masses or nodules in either right or left breasts. No palpable axillary supraclavicular or infraclavicular adenopathy no breast tenderness or nipple discharge. (exam performed in the presence of a chaperone)  LABORATORY DATA:  I have reviewed the data as listed   Chemistry      Component Value Date/Time   NA 139 03/14/2015 1143   NA 141 11/15/2014 1514   K 4.6 03/14/2015 1143   K 4.2 11/15/2014 1514   CL 104 11/15/2014 1514   CL 109* 08/10/2012 1426   CO2 25 03/14/2015 1143   CO2 27 06/22/2011 1035   BUN 19.2 03/14/2015 1143   BUN 20 11/15/2014 1514   CREATININE 1.0 03/14/2015 1143   CREATININE 1.00 11/15/2014 1514      Component Value Date/Time   CALCIUM 9.0 03/14/2015 1143   CALCIUM 9.4 06/22/2011 1035   ALKPHOS 62 03/14/2015 1143   ALKPHOS 58 06/22/2011 1035   AST 19 03/14/2015 1143   AST 19 06/22/2011 1035   ALT 14 03/14/2015 1143   ALT 17 06/22/2011 1035   BILITOT 0.59 03/14/2015 1143   BILITOT 0.4 06/22/2011 1035       Lab Results  Component Value Date   WBC 7.3 03/14/2015   HGB 13.8 03/14/2015   HCT 41.6 03/14/2015   MCV 93.7 03/14/2015   PLT 154 03/14/2015   NEUTROABS 2.9 03/14/2015     ASSESSMENT & PLAN:  Lymphoma, 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 She underwent lymph node biopsy 11/16/2014 which came back as SLL/CLL.  Current treatment: Watchful monitoring Indications for treatment are 1. Rapidly progressive lymphadenopathy 2. B symptoms which include fever chills night sweats weight loss and painful lymph nodes 3. Rapid doubling of CLL lymphocytosis 4. Presence of anemia and thrombocytopenia related to CLL  CBC with  differential done by Dr. Inez Catalina any colon white count 9000, absolute lymphocyte count 5000, hemoglobin 13.7, platelets 152  Exam revealed small cervical lymphadenopathy that are relatively unchanged from before. No indications for treatment.  Return to clinic in 1 year for follow-up with survivorship clinic.   Breast cancer of upper-outer quadrant of left female breast Left breast invasive ductal carcinoma T1 cN0 M0 stage IA, 1.5 cm, ER 98%, PR 0%, Ki-67 9%, HER-2 negative, Oncotype DX 24, 16% risk of recurrence, did not get radiation, currently on Arimidex 1 mg daily since August 2012.  Arimidex toxicities: Denies any hot flashes or muscle aches or pains. I discussed the pros and cons of continuing Arimidex beyond 5 years versus stopping it after the completion of 5 years of therapy. Patient decided to discontinue Arimidex therapy at this time.  Osteopenia: On Fosamax with calcium and vitamin D Breast cancer surveillance:  1. Breast exam 09/23/2015 is normal,  2. mammograms done 12/19/2014 normal , breast density category B  Return to clinic in 1 year with survivorship clinic. Labs are being done by Dr. Joylene Draft.  No orders of the defined types were placed in this encounter.   The patient has a good understanding of the overall plan. she agrees with it. she will  call with any problems that may develop before the next visit here.   Rulon Eisenmenger, MD 09/23/2015

## 2015-09-23 NOTE — Assessment & Plan Note (Signed)
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 She underwent lymph node biopsy 11/16/2014 which came back as SLL/CLL.  Current treatment: Watchful monitoring Indications for treatment are 1. Rapidly progressive lymphadenopathy 2. B symptoms which include fever chills night sweats weight loss and painful lymph nodes 3. Rapid doubling of CLL lymphocytosis 4. Presence of anemia and thrombocytopenia related to CLL  Return to clinic in 6 months for follow-up.

## 2015-12-06 DIAGNOSIS — H35033 Hypertensive retinopathy, bilateral: Secondary | ICD-10-CM | POA: Diagnosis not present

## 2015-12-06 DIAGNOSIS — Z961 Presence of intraocular lens: Secondary | ICD-10-CM | POA: Diagnosis not present

## 2015-12-06 DIAGNOSIS — H35371 Puckering of macula, right eye: Secondary | ICD-10-CM | POA: Diagnosis not present

## 2015-12-06 DIAGNOSIS — H04223 Epiphora due to insufficient drainage, bilateral lacrimal glands: Secondary | ICD-10-CM | POA: Diagnosis not present

## 2015-12-10 ENCOUNTER — Other Ambulatory Visit: Payer: Self-pay | Admitting: Internal Medicine

## 2015-12-10 DIAGNOSIS — Z853 Personal history of malignant neoplasm of breast: Secondary | ICD-10-CM

## 2015-12-11 DIAGNOSIS — Z23 Encounter for immunization: Secondary | ICD-10-CM | POA: Diagnosis not present

## 2015-12-31 ENCOUNTER — Ambulatory Visit
Admission: RE | Admit: 2015-12-31 | Discharge: 2015-12-31 | Disposition: A | Discharge status: Home / Self Care | Payer: Medicare Other | Source: Ambulatory Visit | Attending: Internal Medicine | Admitting: Internal Medicine

## 2015-12-31 DIAGNOSIS — R928 Other abnormal and inconclusive findings on diagnostic imaging of breast: Secondary | ICD-10-CM | POA: Diagnosis not present

## 2015-12-31 DIAGNOSIS — Z853 Personal history of malignant neoplasm of breast: Secondary | ICD-10-CM

## 2016-06-22 DIAGNOSIS — N390 Urinary tract infection, site not specified: Secondary | ICD-10-CM | POA: Diagnosis not present

## 2016-06-22 DIAGNOSIS — R8299 Other abnormal findings in urine: Secondary | ICD-10-CM | POA: Diagnosis not present

## 2016-08-25 DIAGNOSIS — H04223 Epiphora due to insufficient drainage, bilateral lacrimal glands: Secondary | ICD-10-CM | POA: Diagnosis not present

## 2016-08-25 DIAGNOSIS — H01006 Unspecified blepharitis left eye, unspecified eyelid: Secondary | ICD-10-CM | POA: Diagnosis not present

## 2016-08-25 DIAGNOSIS — H02054 Trichiasis without entropian left upper eyelid: Secondary | ICD-10-CM | POA: Diagnosis not present

## 2016-08-29 ENCOUNTER — Encounter: Payer: Self-pay | Admitting: Adult Health

## 2016-09-03 DIAGNOSIS — E559 Vitamin D deficiency, unspecified: Secondary | ICD-10-CM | POA: Diagnosis not present

## 2016-09-03 DIAGNOSIS — R358 Other polyuria: Secondary | ICD-10-CM | POA: Diagnosis not present

## 2016-09-03 DIAGNOSIS — E784 Other hyperlipidemia: Secondary | ICD-10-CM | POA: Diagnosis not present

## 2016-09-03 DIAGNOSIS — R8299 Other abnormal findings in urine: Secondary | ICD-10-CM | POA: Diagnosis not present

## 2016-09-04 DIAGNOSIS — H01006 Unspecified blepharitis left eye, unspecified eyelid: Secondary | ICD-10-CM | POA: Diagnosis not present

## 2016-09-04 DIAGNOSIS — H04322 Acute dacryocystitis of left lacrimal passage: Secondary | ICD-10-CM | POA: Diagnosis not present

## 2016-09-10 DIAGNOSIS — Z1389 Encounter for screening for other disorder: Secondary | ICD-10-CM | POA: Diagnosis not present

## 2016-09-10 DIAGNOSIS — R51 Headache: Secondary | ICD-10-CM | POA: Diagnosis not present

## 2016-09-10 DIAGNOSIS — Z1231 Encounter for screening mammogram for malignant neoplasm of breast: Secondary | ICD-10-CM | POA: Diagnosis not present

## 2016-09-10 DIAGNOSIS — M179 Osteoarthritis of knee, unspecified: Secondary | ICD-10-CM | POA: Diagnosis not present

## 2016-09-10 DIAGNOSIS — C50919 Malignant neoplasm of unspecified site of unspecified female breast: Secondary | ICD-10-CM | POA: Diagnosis not present

## 2016-09-10 DIAGNOSIS — E784 Other hyperlipidemia: Secondary | ICD-10-CM | POA: Diagnosis not present

## 2016-09-10 DIAGNOSIS — R300 Dysuria: Secondary | ICD-10-CM | POA: Diagnosis not present

## 2016-09-10 DIAGNOSIS — B009 Herpesviral infection, unspecified: Secondary | ICD-10-CM | POA: Diagnosis not present

## 2016-09-10 DIAGNOSIS — Z0000 Encounter for general adult medical examination without abnormal findings: Secondary | ICD-10-CM | POA: Diagnosis not present

## 2016-09-10 DIAGNOSIS — C9100 Acute lymphoblastic leukemia not having achieved remission: Secondary | ICD-10-CM | POA: Diagnosis not present

## 2016-09-10 DIAGNOSIS — M48061 Spinal stenosis, lumbar region without neurogenic claudication: Secondary | ICD-10-CM | POA: Diagnosis not present

## 2016-09-10 DIAGNOSIS — Z6828 Body mass index (BMI) 28.0-28.9, adult: Secondary | ICD-10-CM | POA: Diagnosis not present

## 2016-09-14 DIAGNOSIS — H04223 Epiphora due to insufficient drainage, bilateral lacrimal glands: Secondary | ICD-10-CM | POA: Diagnosis not present

## 2016-09-14 DIAGNOSIS — H04322 Acute dacryocystitis of left lacrimal passage: Secondary | ICD-10-CM | POA: Diagnosis not present

## 2016-09-17 ENCOUNTER — Encounter: Payer: Self-pay | Admitting: Adult Health

## 2016-09-17 ENCOUNTER — Other Ambulatory Visit: Payer: Self-pay

## 2016-09-22 ENCOUNTER — Other Ambulatory Visit: Payer: Self-pay

## 2016-09-22 ENCOUNTER — Encounter: Payer: Self-pay | Admitting: Nurse Practitioner

## 2016-09-22 DIAGNOSIS — H04221 Epiphora due to insufficient drainage, right lacrimal gland: Secondary | ICD-10-CM | POA: Diagnosis not present

## 2016-09-22 DIAGNOSIS — H04223 Epiphora due to insufficient drainage, bilateral lacrimal glands: Secondary | ICD-10-CM | POA: Diagnosis not present

## 2016-09-22 DIAGNOSIS — J342 Deviated nasal septum: Secondary | ICD-10-CM | POA: Diagnosis not present

## 2016-09-22 DIAGNOSIS — H04551 Acquired stenosis of right nasolacrimal duct: Secondary | ICD-10-CM | POA: Diagnosis not present

## 2016-09-22 DIAGNOSIS — H04412 Chronic dacryocystitis of left lacrimal passage: Secondary | ICD-10-CM | POA: Diagnosis not present

## 2016-09-22 DIAGNOSIS — H04222 Epiphora due to insufficient drainage, left lacrimal gland: Secondary | ICD-10-CM | POA: Diagnosis not present

## 2016-09-22 DIAGNOSIS — H04552 Acquired stenosis of left nasolacrimal duct: Secondary | ICD-10-CM | POA: Diagnosis not present

## 2016-11-13 ENCOUNTER — Other Ambulatory Visit: Payer: Self-pay | Admitting: *Deleted

## 2016-11-13 DIAGNOSIS — C8300 Small cell B-cell lymphoma, unspecified site: Secondary | ICD-10-CM

## 2016-11-13 DIAGNOSIS — C50412 Malignant neoplasm of upper-outer quadrant of left female breast: Secondary | ICD-10-CM

## 2016-11-17 ENCOUNTER — Other Ambulatory Visit (HOSPITAL_BASED_OUTPATIENT_CLINIC_OR_DEPARTMENT_OTHER): Payer: Medicare Other

## 2016-11-17 ENCOUNTER — Encounter: Payer: Self-pay | Admitting: Adult Health

## 2016-11-17 ENCOUNTER — Ambulatory Visit (HOSPITAL_BASED_OUTPATIENT_CLINIC_OR_DEPARTMENT_OTHER): Payer: Medicare Other | Admitting: Adult Health

## 2016-11-17 VITALS — BP 134/57 | HR 67 | Temp 98.0°F | Resp 17 | Ht 60.0 in | Wt 143.3 lb

## 2016-11-17 DIAGNOSIS — C8300 Small cell B-cell lymphoma, unspecified site: Secondary | ICD-10-CM | POA: Diagnosis not present

## 2016-11-17 DIAGNOSIS — C50412 Malignant neoplasm of upper-outer quadrant of left female breast: Secondary | ICD-10-CM | POA: Diagnosis not present

## 2016-11-17 DIAGNOSIS — Z170 Estrogen receptor positive status [ER+]: Secondary | ICD-10-CM | POA: Diagnosis not present

## 2016-11-17 LAB — CBC WITH DIFFERENTIAL/PLATELET
BASO%: 0.5 % (ref 0.0–2.0)
Basophils Absolute: 0.1 10*3/uL (ref 0.0–0.1)
EOS%: 1 % (ref 0.0–7.0)
Eosinophils Absolute: 0.2 10*3/uL (ref 0.0–0.5)
HCT: 40.2 % (ref 34.8–46.6)
HGB: 13.5 g/dL (ref 11.6–15.9)
LYMPH%: 80.6 % — ABNORMAL HIGH (ref 14.0–49.7)
MCH: 31.7 pg (ref 25.1–34.0)
MCHC: 33.5 g/dL (ref 31.5–36.0)
MCV: 94.6 fL (ref 79.5–101.0)
MONO#: 0.7 10*3/uL (ref 0.1–0.9)
MONO%: 3.6 % (ref 0.0–14.0)
NEUT#: 2.6 10*3/uL (ref 1.5–6.5)
NEUT%: 14.3 % — ABNORMAL LOW (ref 38.4–76.8)
Platelets: 114 10*3/uL — ABNORMAL LOW (ref 145–400)
RBC: 4.25 10*6/uL (ref 3.70–5.45)
RDW: 13.5 % (ref 11.2–14.5)
WBC: 18.4 10*3/uL — ABNORMAL HIGH (ref 3.9–10.3)
lymph#: 14.8 10*3/uL — ABNORMAL HIGH (ref 0.9–3.3)

## 2016-11-17 LAB — TECHNOLOGIST REVIEW

## 2016-11-17 LAB — COMPREHENSIVE METABOLIC PANEL
ALT: 15 U/L (ref 0–55)
AST: 26 U/L (ref 5–34)
Albumin: 3.9 g/dL (ref 3.5–5.0)
Alkaline Phosphatase: 56 U/L (ref 40–150)
Anion Gap: 7 mEq/L (ref 3–11)
BUN: 13.6 mg/dL (ref 7.0–26.0)
CO2: 28 mEq/L (ref 22–29)
Calcium: 10.1 mg/dL (ref 8.4–10.4)
Chloride: 106 mEq/L (ref 98–109)
Creatinine: 1 mg/dL (ref 0.6–1.1)
EGFR: 52 mL/min/{1.73_m2} — ABNORMAL LOW (ref >90)
Glucose: 90 mg/dl (ref 70–140)
Potassium: 4.5 mEq/L (ref 3.5–5.1)
Sodium: 141 mEq/L (ref 136–145)
Total Bilirubin: 0.53 mg/dL (ref 0.20–1.20)
Total Protein: 6.4 g/dL (ref 6.4–8.3)

## 2016-11-17 LAB — LACTATE DEHYDROGENASE: LDH: 308 U/L — ABNORMAL HIGH (ref 125–245)

## 2016-11-17 NOTE — Progress Notes (Signed)
CLINIC:  Survivorship   REASON FOR VISIT:  Routine follow-up for history of breast cancer.   BRIEF ONCOLOGIC HISTORY:    Breast cancer of upper-outer quadrant of left female breast ([place])   09/16/2010 Surgery    Left breast 1.5 cm invasive ductal carcinoma with calcifications, grade 1, perineural invasion was seen, DCIS, margins negative, 2 sentinel nodes negative, ER 99%, PR negative, Ki-67 79%, HER-2 negative, Oncotype 24, 16% ROR      10/25/2010 -  Anti-estrogen oral therapy    Arimidex 1 mg daily developed side effects and was held briefly and restarted       Lymphoma, small lymphocytic ([place])   11/16/2014 Initial Diagnosis    Lymphoma, small lymphocytic: Based on cervical lymph node biopsy CD5 positive CD10 negative        INTERVAL HISTORY:  Brianna York presents to the [facility] today for routine follow-up for her history of breast cancer.  Overall, she reports feeling quite well. She has noted some increase in her lymphadenopathy in her neck.  She denies any recent infection or any other concern (has h/o CLL/SLL).  She has no breast concerns either.  Her next mammogram is due in October of this year at the [facility].     REVIEW OF SYSTEMS:  Review of Systems  Constitutional: Negative for appetite change, chills, fatigue, fever and unexpected weight change.  HENT:   Positive for lump/mass. Negative for hearing loss, sore throat and trouble swallowing.   Eyes: Negative for eye problems and icterus.  Respiratory: Negative for chest tightness, cough and shortness of breath.   Cardiovascular: Negative for chest pain, leg swelling and palpitations.  Gastrointestinal: Negative for abdominal distention and abdominal pain.  Endocrine: Negative for hot flashes.  Musculoskeletal: Negative for arthralgias.  Skin: Negative for itching and rash.  Neurological: Negative for dizziness, extremity weakness, headaches and numbness.  Hematological: Negative for adenopathy. Does  not bruise/bleed easily.  Psychiatric/Behavioral: Negative for depression. The patient is not nervous/anxious.   Breast: Denies any new nodularity, masses, tenderness, nipple changes, or nipple discharge.       PAST MEDICAL/SURGICAL HISTORY:  Past Medical History:  Diagnosis Date  . Arthritis    osteo arthritis  . Breast cancer ([place]) 02/16/2011  . GERD (gastroesophageal reflux disease)   . Hyperlipidemia   . Osteopenia    Past Surgical History:  Procedure Laterality Date  . bartholian cyst  1986  . BREAST LUMPECTOMY Left 09-16-2010  . CATARACT EXTRACTION, BILATERAL    . [place]  . KNEE ARTHROSCOPY Right 2010  . LYMPH GLAND EXCISION Right 11/16/2014   Procedure: EXCISION AND BIOPSY OF RIGHT CERVICAL LYMPH NODE;  Surgeon: Jackolyn Confer, MD;  Location: [facility];  Service: General;  Laterality: Right;  . TONSILLECTOMY  1940     ALLERGIES:  Allergies  Allergen Reactions  . Ciprofloxacin Nausea Only    Dizziness, weakness  . Loratadine-Pseudoephedrine Er     REACTION: anxious  . Penicillins     REACTION: swelling  . Tolterodine Tartrate     REACTION: nausea     CURRENT MEDICATIONS:  Outpatient Encounter Prescriptions as of 11/17/2016  Medication Sig  . alendronate (FOSAMAX) 70 MG tablet Take 70 mg by mouth once a week. Take with a full glass of water on an empty stomach.  Marland Kitchen atorvastatin (LIPITOR) 10 MG tablet Take 10 mg by mouth daily.  . Vitamin D, Ergocalciferol, (DRISDOL) 50000 UNITS CAPS capsule Take 50,000 Units  by mouth every 7 (seven) days.  . [DISCONTINUED] HYDROcodone-acetaminophen (NORCO) 5-325 MG per tablet Take 1-2 tablets by mouth every 6 (six) hours as needed for moderate pain or severe pain. (Patient not taking: Reported on 11/29/2014)   No facility-administered encounter medications on file as of 11/17/2016.      ONCOLOGIC FAMILY HISTORY:  Family History  Problem Relation Age of Onset  . Colon cancer Mother 91     GENETIC COUNSELING/TESTING: Not at this time  SOCIAL HISTORY:  Brianna York is widowed and lives alone in [place], [place].  She has 5 children and they live in [place], [place], [place], [place], [place], [place], [place], [place], and [place], [place].  Ms. Hoe is currently retired.  She denies any current or history of tobacco, alcohol, or illicit drug use.     PHYSICAL EXAMINATION:  Vital Signs: Vitals:   11/17/16 1108  BP: (!) 134/57  Pulse: 67  Resp: 17  Temp: 98 F (36.7 C)  SpO2: 100%   Filed Weights   11/17/16 1108  Weight: 143 lb 4.8 oz (65 kg)   General: Well-nourished, well-appearing female in no acute distress.  Unaccompanied today.   HEENT: Head is normocephalic.  Pupils equal and reactive to light. Conjunctivae clear without exudate.  Sclerae anicteric. Oral mucosa is pink, moist.  Oropharynx is pink without lesions or erythema.  Lymph: No cervical, supraclavicular, or infraclavicular lymphadenopathy noted on palpation.  Cardiovascular: Regular rate and rhythm.Marland Kitchen Respiratory: Clear to auscultation bilaterally. Chest expansion symmetric; breathing non-labored.  Breast Exam:  -Left breast: No appreciable masses on palpation. No skin redness, thickening, or peau d'orange appearance; no nipple retraction or nipple discharge; mild distortion in symmetry at previous lumpectomy site well healed scar without erythema or nodularity.  -Right breast: No appreciable masses on palpation. No skin redness, thickening, or peau d'orange appearance; no nipple retraction or nipple discharge;  -Axilla: No axillary adenopathy bilaterally.  GI: Abdomen soft and round; non-tender, non-distended. Bowel sounds normoactive. No hepatosplenomegaly.   GU: Deferred.  Neuro: No focal deficits. Steady gait.  Psych: Mood and affect normal and appropriate for situation.  MSK: No focal spinal tenderness to palpation, full range of motion in bilateral upper  extremities Extremities: No edema. Skin: Warm and dry.  LABORATORY DATA:  Appointment on 11/17/2016  Component Date Value Ref Range Status  . WBC 11/17/2016 18.4* 3.9 - 10.3 10e3/uL Final  . NEUT# 11/17/2016 2.6  1.5 - 6.5 10e3/uL Final  . HGB 11/17/2016 13.5  11.6 - 15.9 g/dL Final  . HCT 11/17/2016 40.2  34.8 - 46.6 % Final  . Platelets 11/17/2016 114* 145 - 400 10e3/uL Final  . MCV 11/17/2016 94.6  79.5 - 101.0 fL Final  . MCH 11/17/2016 31.7  25.1 - 34.0 pg Final  . MCHC 11/17/2016 33.5  31.5 - 36.0 g/dL Final  . RBC 11/17/2016 4.25  3.70 - 5.45 10e6/uL Final  . RDW 11/17/2016 13.5  11.2 - 14.5 % Final  . lymph# 11/17/2016 14.8* 0.9 - 3.3 10e3/uL Final  . MONO# 11/17/2016 0.7  0.1 - 0.9 10e3/uL Final  . Eosinophils Absolute 11/17/2016 0.2  0.0 - 0.5 10e3/uL Final  . Basophils Absolute 11/17/2016 0.1  0.0 - 0.1 10e3/uL Final  . NEUT% 11/17/2016 14.3* 38.4 - 76.8 % Final  . LYMPH% 11/17/2016 80.6* 14.0 - 49.7 % Final  . MONO% 11/17/2016 3.6  0.0 - 14.0 % Final  . EOS% 11/17/2016 1.0  0.0 - 7.0 % Final  . BASO% 11/17/2016 0.5  0.0 - 2.0 % Final  . Sodium 11/17/2016 141  136 - 145 mEq/L Final  . Potassium 11/17/2016 4.5  3.5 - 5.1 mEq/L Final  . Chloride 11/17/2016 106  98 - 109 mEq/L Final  . CO2 11/17/2016 28  22 - 29 mEq/L Final  . Glucose 11/17/2016 90  70 - 140 mg/dl Final   Glucose reference range is for nonfasting patients. Fasting glucose reference range is 70- 100.  Marland Kitchen BUN 11/17/2016 13.6  7.0 - 26.0 mg/dL Final  . Creatinine 11/17/2016 1.0  0.6 - 1.1 mg/dL Final  . Total Bilirubin 11/17/2016 0.53  0.20 - 1.20 mg/dL Final  . Alkaline Phosphatase 11/17/2016 56  40 - 150 U/L Final  . AST 11/17/2016 26  5 - 34 U/L Final  . ALT 11/17/2016 15  0 - 55 U/L Final  . Total Protein 11/17/2016 6.4  6.4 - 8.3 g/dL Final  . Albumin 11/17/2016 3.9  3.5 - 5.0 g/dL Final  . Calcium 11/17/2016 10.1  8.4 - 10.4 mg/dL Final  . Anion Gap 11/17/2016 7  3 - 11 mEq/L Final  . EGFR  11/17/2016 52* >90 ml/min/1.73 m2 Final   eGFR is calculated using the CKD-EPI Creatinine Equation (2009)  . LDH 11/17/2016 308* 125 - 245 U/L Final  . Technologist Review 11/17/2016 variant lymphs and smudge cells seen   Final     DIAGNOSTIC IMAGING:  Most recent mammogram:      ASSESSMENT AND PLAN:  Ms.. Trew is a pleasant 82 y.o. female with history of Stage IA left breast invasive ductal carcinoma, ER+/PR+/HER2-, diagnosed in 09/2010, treated with lumpectomy, and anti-estrogen therapy with Anastrozole x 5 years.  She presents to the Survivorship Clinic for surveillance and routine follow-up.   1. History of breast cancer:  Ms. Brinkley is currently clinically and radiographically without evidence of disease or recurrence of breast cancer. She will be due for mammogram in 12/2016; orders placed today.  I encouraged her to call me with any questions or concerns before her next visit at the cancer center, and I would be happy to see her sooner, if needed.    2.  CLL/SLL: WBC is elevated more so than last one year ago and her lymphadenopathy is worse. Other than that, she is asymptomatic.  She will return in about 3 months for follow up with Dr. Lindi Adie and lab work to evaluate.  She tells me she is going to work on eating an organic vegan diet to see if that will help in the interim because she really doesn't want any chemotherapy or treatment.    3. Bone health:  Given Ms. Weitzel's age, history of breast cancer, and her previous anti-estrogen therapy with Anastrozole, she is at risk for bone demineralization.  She was given education on specific food and activities to promote bone health.  I will defer to her PCP regarding further bone density testing and management.    4. Cancer screening:  Due to Ms. Rosko's history and her age, she should receive screening for skin cancers. She was encouraged to follow-up with her PCP for appropriate cancer screenings.   5. Health maintenance and wellness  promotion: Ms. Grupe was encouraged to consume 5-7 servings of fruits and vegetables per day. She was also encouraged to engage in moderate to vigorous exercise for 30 minutes per day most days of the week. She was instructed to limit her alcohol consumption and continue to abstain from tobacco use.    Dispo:  -Return  to cancer center in 3 months for follow up with Dr. Lindi Adie regarding her CLL.   A total of (30) minutes of face-to-face time was spent with this patient with greater than 50% of that time in counseling and care-coordination.   Gardenia Phlegm, NP Survivorship Program [facility] [phone]   Note: PRIMARY CARE PROVIDER [name], [place] [phone]

## 2016-12-09 ENCOUNTER — Other Ambulatory Visit: Payer: Self-pay | Admitting: Endocrinology

## 2016-12-09 DIAGNOSIS — Z853 Personal history of malignant neoplasm of breast: Secondary | ICD-10-CM

## 2016-12-11 DIAGNOSIS — H35033 Hypertensive retinopathy, bilateral: Secondary | ICD-10-CM | POA: Diagnosis not present

## 2016-12-11 DIAGNOSIS — H35371 Puckering of macula, right eye: Secondary | ICD-10-CM | POA: Diagnosis not present

## 2016-12-11 DIAGNOSIS — H04223 Epiphora due to insufficient drainage, bilateral lacrimal glands: Secondary | ICD-10-CM | POA: Diagnosis not present

## 2016-12-11 DIAGNOSIS — H04322 Acute dacryocystitis of left lacrimal passage: Secondary | ICD-10-CM | POA: Diagnosis not present

## 2016-12-11 DIAGNOSIS — Z961 Presence of intraocular lens: Secondary | ICD-10-CM | POA: Diagnosis not present

## 2016-12-11 DIAGNOSIS — H04123 Dry eye syndrome of bilateral lacrimal glands: Secondary | ICD-10-CM | POA: Diagnosis not present

## 2016-12-11 DIAGNOSIS — Z23 Encounter for immunization: Secondary | ICD-10-CM | POA: Diagnosis not present

## 2016-12-30 ENCOUNTER — Other Ambulatory Visit: Payer: Self-pay | Admitting: Internal Medicine

## 2016-12-30 DIAGNOSIS — Z853 Personal history of malignant neoplasm of breast: Secondary | ICD-10-CM

## 2016-12-31 ENCOUNTER — Ambulatory Visit
Admission: RE | Admit: 2016-12-31 | Discharge: 2016-12-31 | Disposition: A | Discharge status: Home / Self Care | Payer: Medicare Other | Source: Ambulatory Visit | Attending: Endocrinology | Admitting: Endocrinology

## 2016-12-31 DIAGNOSIS — R928 Other abnormal and inconclusive findings on diagnostic imaging of breast: Secondary | ICD-10-CM | POA: Diagnosis not present

## 2016-12-31 DIAGNOSIS — Z853 Personal history of malignant neoplasm of breast: Secondary | ICD-10-CM

## 2017-01-07 IMAGING — CT CT NECK W/ CM
5 of 7 series · 14 of 33 positions shown, 15 images · IV contrast (APPLIED)
Comparison: Chest CT from today reported separately.

CLINICAL DATA: 80-year-old female with right side lymphadenopathy
discovered 4 weeks ago. Just finished antibiotic treatment 3 days
ago. Personal history of left side breast cancer in 6926 treated
with surgery and chemotherapy. Subsequent encounter.

BUN and creatinine were obtained on site at [HOSPITAL] at
[HOSPITAL].
Results:  BUN 16 mg/dL,  Creatinine 1.0 mg/dL.
EXAM:
CT NECK WITH CONTRAST
TECHNIQUE: Multidetector CT imaging of the neck was performed using the
standard protocol following the bolus administration of intravenous
contrast.
CONTRAST:  75mL GHRFC8-X66 IOPAMIDOL (GHRFC8-X66) INJECTION 61% in
conjunction with contrast enhanced imaging of the chest reported
separately.

[Series 3: neck · axial · 0.41mm/px · z∈[-311,-206]mm · 3 of 71 slices shown]
[im 18/71  bone]
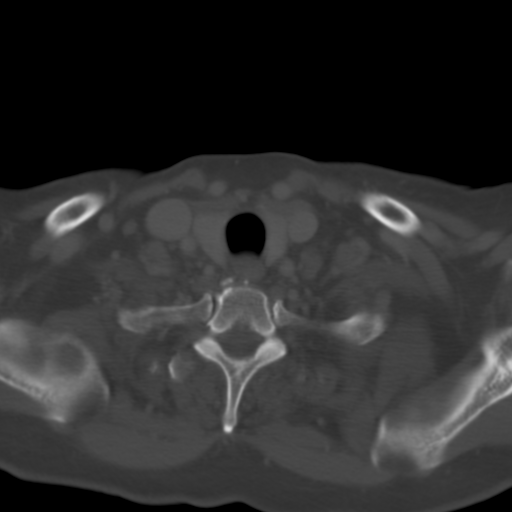
[im 36/71  bone]
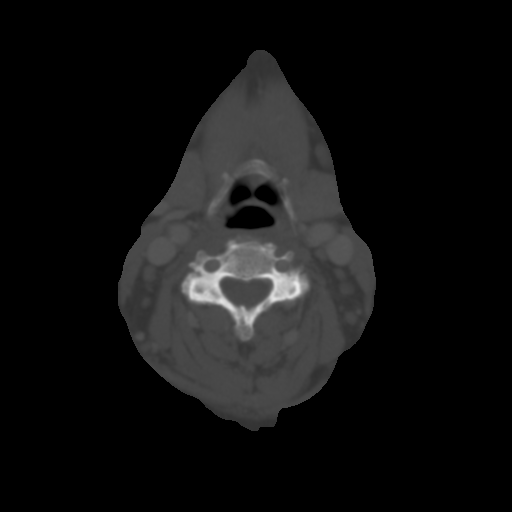
[im 53/71  bone]
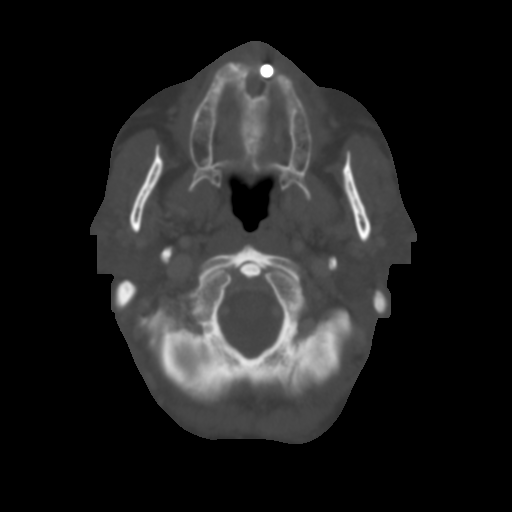

[Series 4: chest 5.0 i41s 1 · axial · 0.63mm/px · z∈[-482,-378]mm · 2 of 63 slices shown, 3 images]
[im 21/63  soft-tissue]
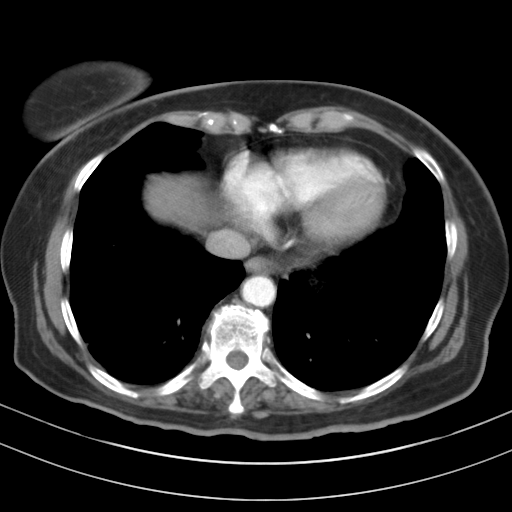
[im 21/63  bone]
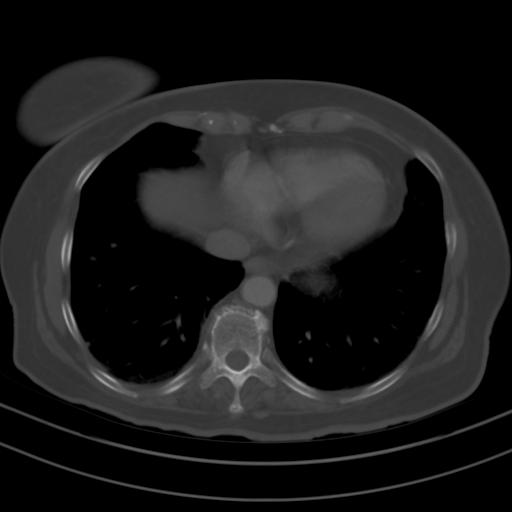
[im 42/63  bone]
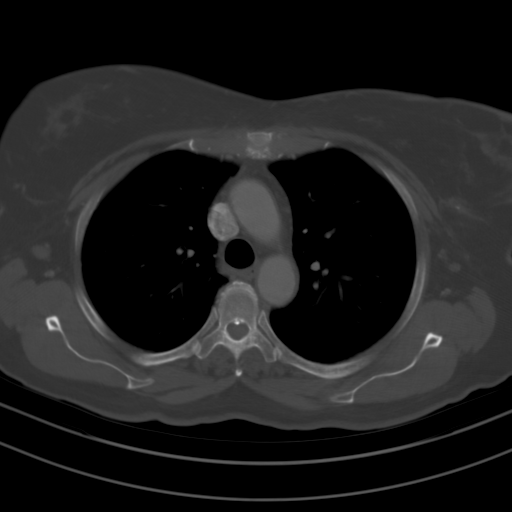

[Series 7: sag chest · sagittal · 0.53mm/px · 5 of 102 slices shown]
[im 15/102  bone]
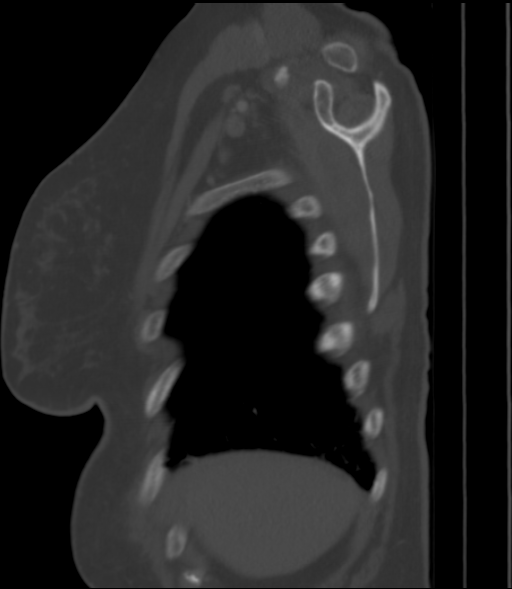
[im 29/102  bone]
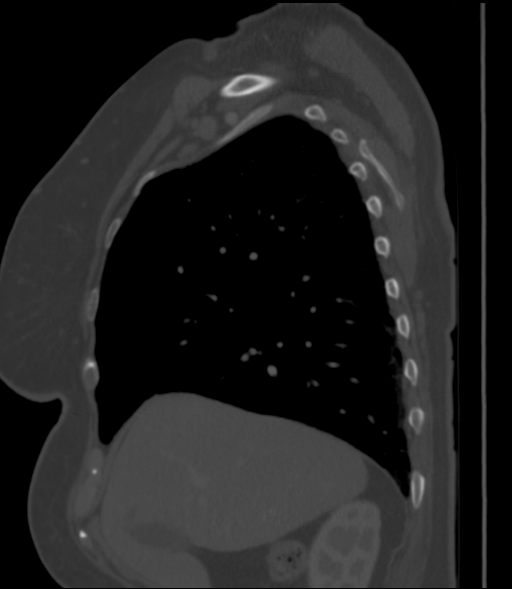
[im 44/102  bone]
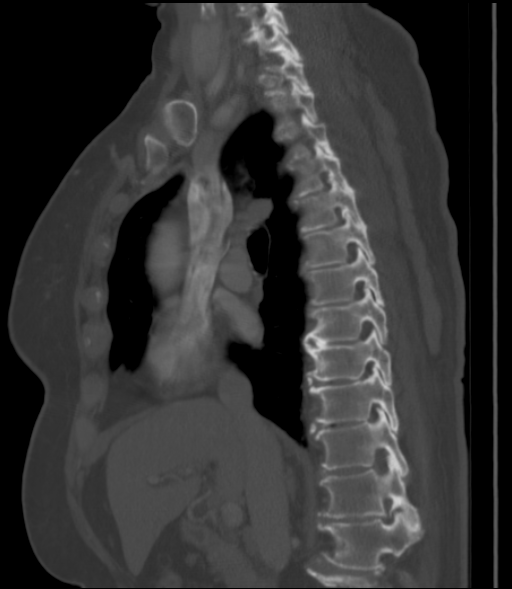
[im 58/102  bone]
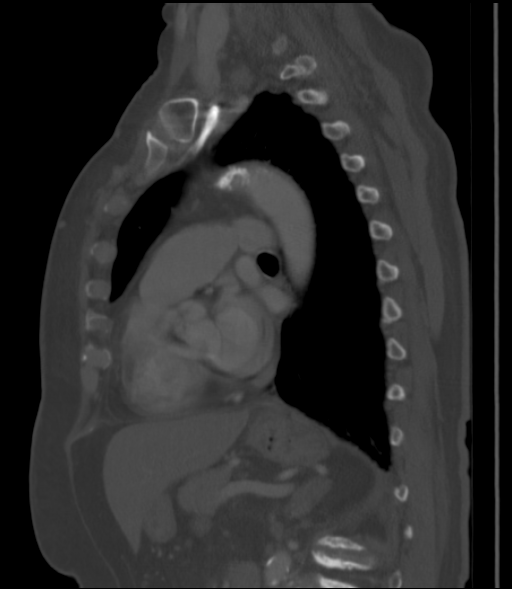
[im 73/102  bone]
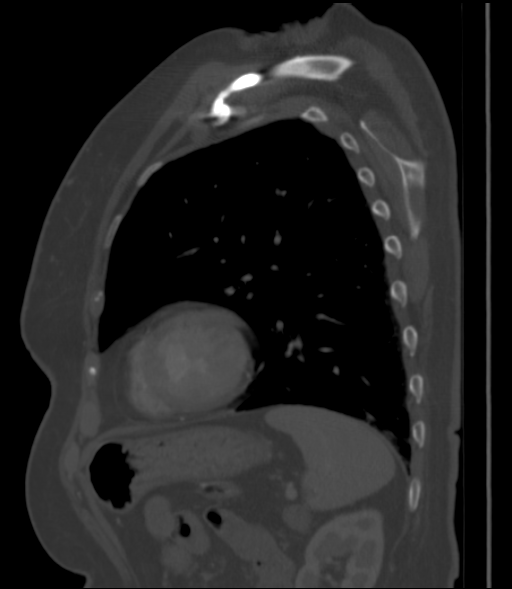

[Series 10: cor neck · coronal · 0.39mm/px · 1 of 55 slices shown]
[im 28/55  bone]
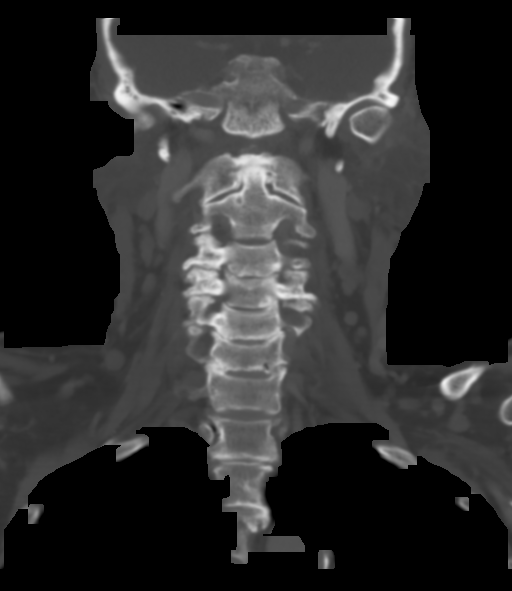

[Series 12: angled axial neck · axial · 0.39mm/px · z∈[-326,-220]mm · 3 of 74 slices shown]
[im 19/74  bone]
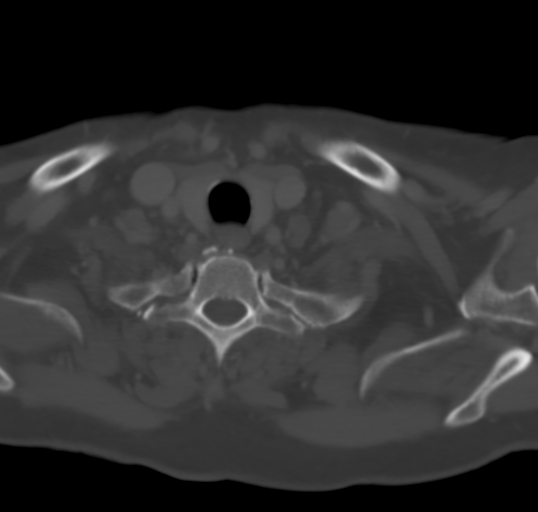
[im 37/74  bone]
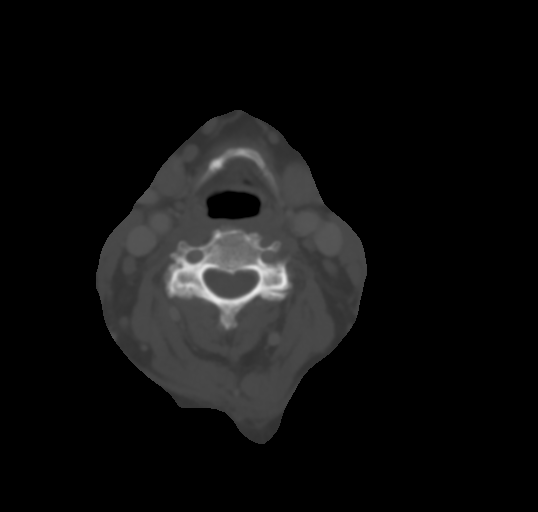
[im 55/74  bone]
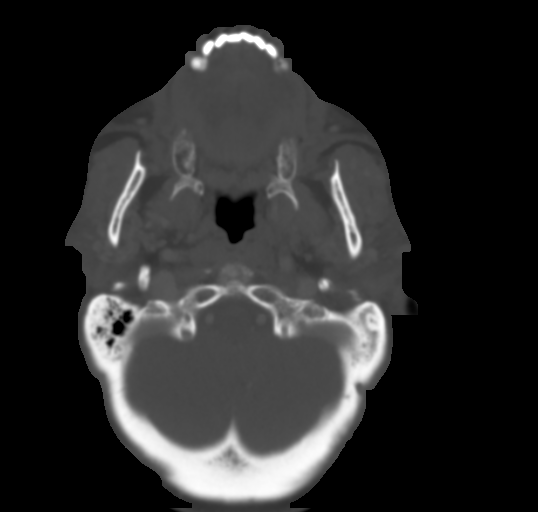

[14 of 33 positions shown; findings below may reference images not displayed]

FINDINGS: Pharynx and larynx: Laryngeal and pharyngeal soft tissue contours
are within normal limits. Negative parapharyngeal and
retropharyngeal spaces.

Salivary glands: Submandibular and parotid glands are within normal
limits. Negative sublingual space.

Thyroid: Negative.

Lymph nodes:

Subcentimeter cervical lymph nodes appear increased in number
bilaterally. The largest right side node identified measures 8 mm
short axis at the right lateral level 4 nodal station (series 3,
image 45) and this node is mildly rounded. The next largest lymph
node is at the right level IIa station measuring 7 mm short axis,
but this node has a normal fatty hilum and elongated morphology.
Except for level 5, bilateral cervical lymph node stations
demonstrate what seems to be an increased number of small nodes.
Bilateral subclavian neurovascular bundle nodes also appear
increased bilaterally (e.g. Series 3, images 61-65). No cystic or
necrotic nodes are identified. No specific area marked along the
right neck to denote the palpable node.

Vascular: Major vascular structures in the neck and at the skullbase
are patent. Right greater than left carotid bifurcation
atherosclerosis.

Limited intracranial: Negative.

Visualized orbits: Negative, postoperative changes to both globes.

Mastoids and visualized paranasal sinuses: Clear.

Skeleton: Advanced degenerative changes in the cervical spine,
including ossification of the posterior longitudinal ligament at C5
and C6. There is superimposed 2-3 mm of degenerative anterolisthesis
that C4-C5. Subsequently there is up to moderate degenerative
cervical spinal stenosis. No suspicious osseous lesion identified in
the neck or upper chest.

Upper chest: No superior mediastinal lymphadenopathy. There is a
small 5-6 mm short axis right peritracheal node. Other chest
findings are reported separately.
IMPRESSION: 1. Cervical lymph nodes appear increased in number bilaterally, but
remain within normal limits by size criteria. Still, the largest
right side node is abnormally rounded and is 8 mm (lateral right
level 4 station). Also, subclavian neurovascular bundle nodes in the
visible upper chest likewise appear increased.
The appearance is suspicious for either metastatic nodal disease or
lymphoproliferative disorder (such as leukemia or lymphoma). A Lymph
node biopsy might be the most appropriate next step.
2. Other neck soft tissues are within normal limits.
3. Chest CT findings today reported separately.

## 2017-02-15 ENCOUNTER — Other Ambulatory Visit: Payer: Self-pay

## 2017-02-15 DIAGNOSIS — C50412 Malignant neoplasm of upper-outer quadrant of left female breast: Secondary | ICD-10-CM

## 2017-02-15 DIAGNOSIS — Z170 Estrogen receptor positive status [ER+]: Secondary | ICD-10-CM

## 2017-02-16 ENCOUNTER — Telehealth: Payer: Self-pay | Admitting: Hematology and Oncology

## 2017-02-16 ENCOUNTER — Other Ambulatory Visit (HOSPITAL_BASED_OUTPATIENT_CLINIC_OR_DEPARTMENT_OTHER): Payer: Medicare Other

## 2017-02-16 ENCOUNTER — Ambulatory Visit (HOSPITAL_BASED_OUTPATIENT_CLINIC_OR_DEPARTMENT_OTHER): Payer: Medicare Other | Admitting: Hematology and Oncology

## 2017-02-16 VITALS — BP 109/78 | HR 72 | Temp 97.9°F | Resp 17 | Ht 60.0 in | Wt 142.2 lb

## 2017-02-16 DIAGNOSIS — C50412 Malignant neoplasm of upper-outer quadrant of left female breast: Secondary | ICD-10-CM

## 2017-02-16 DIAGNOSIS — Z853 Personal history of malignant neoplasm of breast: Secondary | ICD-10-CM | POA: Diagnosis not present

## 2017-02-16 DIAGNOSIS — C9110 Chronic lymphocytic leukemia of B-cell type not having achieved remission: Secondary | ICD-10-CM

## 2017-02-16 DIAGNOSIS — Z170 Estrogen receptor positive status [ER+]: Secondary | ICD-10-CM

## 2017-02-16 DIAGNOSIS — C8300 Small cell B-cell lymphoma, unspecified site: Secondary | ICD-10-CM

## 2017-02-16 LAB — COMPREHENSIVE METABOLIC PANEL
ALT: 16 U/L (ref 0–55)
AST: 28 U/L (ref 5–34)
Albumin: 4 g/dL (ref 3.5–5.0)
Alkaline Phosphatase: 54 U/L (ref 40–150)
Anion Gap: 7 mEq/L (ref 3–11)
BUN: 22.7 mg/dL (ref 7.0–26.0)
CO2: 24 mEq/L (ref 22–29)
Calcium: 8.9 mg/dL (ref 8.4–10.4)
Chloride: 110 mEq/L — ABNORMAL HIGH (ref 98–109)
Creatinine: 1 mg/dL (ref 0.6–1.1)
EGFR: 53 mL/min/{1.73_m2} — ABNORMAL LOW (ref >60)
Glucose: 108 mg/dl (ref 70–140)
Potassium: 3.9 mEq/L (ref 3.5–5.1)
Sodium: 141 mEq/L (ref 136–145)
Total Bilirubin: 0.61 mg/dL (ref 0.20–1.20)
Total Protein: 6.3 g/dL — ABNORMAL LOW (ref 6.4–8.3)

## 2017-02-16 LAB — CBC WITH DIFFERENTIAL/PLATELET
BASO%: 0.4 % (ref 0.0–2.0)
Basophils Absolute: 0.1 10*3/uL (ref 0.0–0.1)
EOS%: 1.4 % (ref 0.0–7.0)
Eosinophils Absolute: 0.2 10*3/uL (ref 0.0–0.5)
HCT: 41 % (ref 34.8–46.6)
HGB: 13.4 g/dL (ref 11.6–15.9)
LYMPH%: 76.6 % — ABNORMAL HIGH (ref 14.0–49.7)
MCH: 31.1 pg (ref 25.1–34.0)
MCHC: 32.7 g/dL (ref 31.5–36.0)
MCV: 95.2 fL (ref 79.5–101.0)
MONO#: 0.5 10*3/uL (ref 0.1–0.9)
MONO%: 3.9 % (ref 0.0–14.0)
NEUT#: 2.2 10*3/uL (ref 1.5–6.5)
NEUT%: 17.7 % — ABNORMAL LOW (ref 38.4–76.8)
Platelets: 111 10*3/uL — ABNORMAL LOW (ref 145–400)
RBC: 4.31 10*6/uL (ref 3.70–5.45)
RDW: 13.8 % (ref 11.2–14.5)
WBC: 12.2 10*3/uL — ABNORMAL HIGH (ref 3.9–10.3)
lymph#: 9.3 10*3/uL — ABNORMAL HIGH (ref 0.9–3.3)

## 2017-02-16 LAB — TECHNOLOGIST REVIEW

## 2017-02-16 NOTE — Progress Notes (Signed)
Patient Care Team: [name], MD as PCP - General (Internal Medicine)  DIAGNOSIS:  Encounter Diagnoses  Name Primary?  . Malignant neoplasm of upper-outer quadrant of left breast in female, estrogen receptor positive ([place])   . CLL (chronic lymphocytic leukemia) ([place]) Yes    SUMMARY OF ONCOLOGIC HISTORY:   Breast cancer of upper-outer quadrant of left female breast ([place])   09/16/2010 Surgery    Left breast 1.5 cm invasive ductal carcinoma with calcifications, grade 1, perineural invasion was seen, DCIS, margins negative, 2 sentinel nodes negative, ER 99%, PR negative, Ki-67 79%, HER-2 negative, Oncotype 24, 16% ROR      10/25/2010 -  Anti-estrogen oral therapy    Arimidex 1 mg daily developed side effects and was held briefly and restarted       Lymphoma, small lymphocytic ([place])   11/16/2014 Initial Diagnosis    Lymphoma, small lymphocytic: Based on cervical lymph node biopsy CD5 positive CD10 negative       CHIEF COMPLIANT: Follow-up of SLL and left breast cancer  INTERVAL HISTORY: Brianna York is a 82 year old with above-mentioned history of left breast cancer treated with surgery followed by Arimidex therapy since August 2012.  She completed 5 years of treatment by August 2017.  She denies any lumps or nodules in the breast.  She was also diagnosed with small lymphocytic lymphoma on a cervical lymph node biopsy in 2016.  She is also being monitored and observed for that she denies any new symptoms related to lymphoma.  Denies any fevers chills night sweats or weight loss.  REVIEW OF SYSTEMS:   Constitutional: Denies fevers, chills or abnormal weight loss Eyes: Denies blurriness of vision Ears, nose, mouth, throat, and face: Denies mucositis or sore throat Respiratory: Denies cough, dyspnea or wheezes Cardiovascular: Denies palpitation, chest discomfort Gastrointestinal:  Denies nausea, heartburn or change in bowel habits Skin: Denies abnormal skin rashes Lymphatics:  Bilateral cervical or axillary lymphadenopathy Neurological:Denies numbness, tingling or new weaknesses Behavioral/Psych: Mood is stable, no new changes  Extremities: No lower extremity edema Breast:  denies any pain or lumps or nodules in either breasts All other systems were reviewed with the patient and are negative.  I have reviewed the past medical history, past surgical history, social history and family history with the patient and they are unchanged from previous note.  ALLERGIES:  is allergic to ciprofloxacin; loratadine-pseudoephedrine er; penicillins; and tolterodine tartrate.  MEDICATIONS:  Current Outpatient Medications  Medication Sig Dispense Refill  . alendronate (FOSAMAX) 70 MG tablet Take 70 mg by mouth once a week. Take with a full glass of water on an empty stomach.    Marland Kitchen atorvastatin (LIPITOR) 10 MG tablet Take 10 mg by mouth daily.    . Vitamin D, Ergocalciferol, (DRISDOL) 50000 UNITS CAPS capsule Take 50,000 Units by mouth every 7 (seven) days.     No current facility-administered medications for this visit.     PHYSICAL EXAMINATION: ECOG PERFORMANCE STATUS: 1 - Symptomatic but completely ambulatory  Vitals:   02/16/17 1107  BP: 109/78  Pulse: 72  Resp: 17  Temp: 97.9 F (36.6 C)  SpO2: 100%   Filed Weights   02/16/17 1107  Weight: 142 lb 3.2 oz (64.5 kg)    GENERAL:alert, no distress and comfortable SKIN: skin color, texture, turgor are normal, no rashes or significant lesions EYES: normal, Conjunctiva are pink and non-injected, sclera clear OROPHARYNX:no exudate, no erythema and lips, buccal mucosa, and tongue normal  NECK: Bilateral cervical lymphadenopathy LYMPH:  no palpable lymphadenopathy in the cervical, axillary or inguinal LUNGS: clear to auscultation and percussion with normal breathing effort HEART: regular rate & rhythm and no murmurs and no lower extremity edema ABDOMEN:abdomen soft, non-tender and normal bowel  sounds MUSCULOSKELETAL:no cyanosis of digits and no clubbing  NEURO: alert & oriented x 3 with fluent speech, no focal motor/sensory deficits EXTREMITIES: No lower extremity edema BREAST: No palpable masses or nodules in either right or left breasts. No palpable axillary supraclavicular or infraclavicular adenopathy no breast tenderness or nipple discharge. (exam performed in the presence of a chaperone)  LABORATORY DATA:  I have reviewed the data as listed   Chemistry      Component Value Date/Time   NA 141 02/16/2017 1048   K 3.9 02/16/2017 1048   CL 104 11/15/2014 1514   CL 109 (H) 08/10/2012 1426   CO2 24 02/16/2017 1048   BUN 22.7 02/16/2017 1048   CREATININE 1.0 02/16/2017 1048      Component Value Date/Time   CALCIUM 8.9 02/16/2017 1048   ALKPHOS 54 02/16/2017 1048   AST 28 02/16/2017 1048   ALT 16 02/16/2017 1048   BILITOT 0.61 02/16/2017 1048       Lab Results  Component Value Date   WBC 12.2 (H) 02/16/2017   HGB 13.4 02/16/2017   HCT 41.0 02/16/2017   MCV 95.2 02/16/2017   PLT 111 (L) 02/16/2017   NEUTROABS 2.2 02/16/2017    ASSESSMENT & PLAN:  Breast cancer of upper-outer quadrant of left female breast Left breast invasive ductal carcinoma T1 cN0 M0 stage IA, 1.5 cm, ER 98%, PR 0%, Ki-67 9%, HER-2 negative, Oncotype DX 24, 16% risk of recurrence, did not get radiation, currently on Arimidex 1 mg daily since August 2012 completed August 2017  Osteopenia: On Fosamax with calcium and vitamin D Breast cancer surveillance:  1. Breast exam 02/16/2017 is normal,  2. mammograms done10/18/2018 normal , breast density category B  CLL/SLL:  cervical lymph nodes: No evidence of progression of disease.  We will continue to observe this.  Absolute lymphocyte count is being monitored   Return to clinic in 1 year for follow-up with labs I spent 25 minutes talking to the patient of which more than half was spent in counseling and coordination of care.  Orders Placed  This Encounter  Procedures  . CBC with Differential    Standing Status:   Future    Standing Expiration Date:   02/16/2018  . Comprehensive metabolic panel    Standing Status:   Future    Standing Expiration Date:   02/16/2018  . Lactate dehydrogenase (LDH)    Standing Status:   Future    Standing Expiration Date:   02/16/2018   The patient has a good understanding of the overall plan. she agrees with it. she will call with any problems that may develop before the next visit here.   Rulon Eisenmenger, MD 02/16/17

## 2017-02-16 NOTE — Telephone Encounter (Signed)
Gave patient AVs and calendar of upcoming December 2019 appointments

## 2017-02-16 NOTE — Assessment & Plan Note (Addendum)
Left breast invasive ductal carcinoma T1 cN0 M0 stage IA, 1.5 cm, ER 98%, PR 0%, Ki-67 9%, HER-2 negative, Oncotype DX 24, 16% risk of recurrence, did not get radiation, currently on Arimidex 1 mg daily since August 2012 completed August 2017  Osteopenia: On Fosamax with calcium and vitamin D Breast cancer surveillance:  1. Breast exam 02/16/2017 is normal,  2. mammograms done10/18/2018 normal , breast density category B  Return to clinic in 1 year for follow up in survivorship clinic

## 2017-05-06 DIAGNOSIS — H04302 Unspecified dacryocystitis of left lacrimal passage: Secondary | ICD-10-CM | POA: Diagnosis not present

## 2017-07-12 DIAGNOSIS — C9100 Acute lymphoblastic leukemia not having achieved remission: Secondary | ICD-10-CM | POA: Diagnosis not present

## 2017-07-12 DIAGNOSIS — N390 Urinary tract infection, site not specified: Secondary | ICD-10-CM | POA: Diagnosis not present

## 2017-07-12 DIAGNOSIS — R05 Cough: Secondary | ICD-10-CM | POA: Diagnosis not present

## 2017-07-12 DIAGNOSIS — Z6828 Body mass index (BMI) 28.0-28.9, adult: Secondary | ICD-10-CM | POA: Diagnosis not present

## 2017-07-12 DIAGNOSIS — R300 Dysuria: Secondary | ICD-10-CM | POA: Diagnosis not present

## 2017-07-12 DIAGNOSIS — J309 Allergic rhinitis, unspecified: Secondary | ICD-10-CM | POA: Diagnosis not present

## 2017-07-12 DIAGNOSIS — K219 Gastro-esophageal reflux disease without esophagitis: Secondary | ICD-10-CM | POA: Diagnosis not present

## 2017-07-12 DIAGNOSIS — H0489 Other disorders of lacrimal system: Secondary | ICD-10-CM | POA: Diagnosis not present

## 2017-10-18 DIAGNOSIS — R82998 Other abnormal findings in urine: Secondary | ICD-10-CM | POA: Diagnosis not present

## 2017-10-18 DIAGNOSIS — M859 Disorder of bone density and structure, unspecified: Secondary | ICD-10-CM | POA: Diagnosis not present

## 2017-10-18 DIAGNOSIS — E559 Vitamin D deficiency, unspecified: Secondary | ICD-10-CM | POA: Diagnosis not present

## 2017-10-18 DIAGNOSIS — E7849 Other hyperlipidemia: Secondary | ICD-10-CM | POA: Diagnosis not present

## 2017-10-25 DIAGNOSIS — H0489 Other disorders of lacrimal system: Secondary | ICD-10-CM | POA: Diagnosis not present

## 2017-10-25 DIAGNOSIS — R591 Generalized enlarged lymph nodes: Secondary | ICD-10-CM | POA: Diagnosis not present

## 2017-10-25 DIAGNOSIS — F418 Other specified anxiety disorders: Secondary | ICD-10-CM | POA: Diagnosis not present

## 2017-10-25 DIAGNOSIS — J3089 Other allergic rhinitis: Secondary | ICD-10-CM | POA: Diagnosis not present

## 2017-10-25 DIAGNOSIS — Z0000 Encounter for general adult medical examination without abnormal findings: Secondary | ICD-10-CM | POA: Diagnosis not present

## 2017-10-25 DIAGNOSIS — M5489 Other dorsalgia: Secondary | ICD-10-CM | POA: Diagnosis not present

## 2017-10-25 DIAGNOSIS — Z6828 Body mass index (BMI) 28.0-28.9, adult: Secondary | ICD-10-CM | POA: Diagnosis not present

## 2017-10-25 DIAGNOSIS — C50919 Malignant neoplasm of unspecified site of unspecified female breast: Secondary | ICD-10-CM | POA: Diagnosis not present

## 2017-10-25 DIAGNOSIS — B009 Herpesviral infection, unspecified: Secondary | ICD-10-CM | POA: Diagnosis not present

## 2017-10-25 DIAGNOSIS — C9100 Acute lymphoblastic leukemia not having achieved remission: Secondary | ICD-10-CM | POA: Diagnosis not present

## 2017-10-25 DIAGNOSIS — E7849 Other hyperlipidemia: Secondary | ICD-10-CM | POA: Diagnosis not present

## 2017-10-25 DIAGNOSIS — Z1389 Encounter for screening for other disorder: Secondary | ICD-10-CM | POA: Diagnosis not present

## 2017-10-26 ENCOUNTER — Encounter: Payer: Self-pay | Admitting: Adult Health

## 2017-10-27 ENCOUNTER — Telehealth: Payer: Self-pay | Admitting: Hematology and Oncology

## 2017-10-27 NOTE — Telephone Encounter (Signed)
Per 8/14 schedule message moved 12/3 lab/fu to early to mid September. Spoke with patient re change and new appointments for 9/9 @ 9 am.

## 2017-10-29 DIAGNOSIS — Z1212 Encounter for screening for malignant neoplasm of rectum: Secondary | ICD-10-CM | POA: Diagnosis not present

## 2017-11-22 ENCOUNTER — Inpatient Hospital Stay (HOSPITAL_BASED_OUTPATIENT_CLINIC_OR_DEPARTMENT_OTHER): Payer: Medicare Other | Admitting: Hematology and Oncology

## 2017-11-22 ENCOUNTER — Telehealth: Payer: Self-pay | Admitting: Hematology and Oncology

## 2017-11-22 ENCOUNTER — Inpatient Hospital Stay: Payer: Medicare Other | Attending: Hematology and Oncology

## 2017-11-22 VITALS — BP 135/59 | HR 79 | Temp 98.6°F | Resp 16 | Ht 60.0 in | Wt 131.8 lb

## 2017-11-22 DIAGNOSIS — Z170 Estrogen receptor positive status [ER+]: Secondary | ICD-10-CM | POA: Diagnosis not present

## 2017-11-22 DIAGNOSIS — C50412 Malignant neoplasm of upper-outer quadrant of left female breast: Secondary | ICD-10-CM | POA: Diagnosis not present

## 2017-11-22 DIAGNOSIS — C8300 Small cell B-cell lymphoma, unspecified site: Secondary | ICD-10-CM

## 2017-11-22 DIAGNOSIS — Z79811 Long term (current) use of aromatase inhibitors: Secondary | ICD-10-CM | POA: Insufficient documentation

## 2017-11-22 DIAGNOSIS — Z79899 Other long term (current) drug therapy: Secondary | ICD-10-CM

## 2017-11-22 DIAGNOSIS — K529 Noninfective gastroenteritis and colitis, unspecified: Secondary | ICD-10-CM

## 2017-11-22 DIAGNOSIS — C9110 Chronic lymphocytic leukemia of B-cell type not having achieved remission: Secondary | ICD-10-CM | POA: Diagnosis not present

## 2017-11-22 LAB — COMPREHENSIVE METABOLIC PANEL
ALT: 11 U/L (ref 0–44)
AST: 23 U/L (ref 15–41)
Albumin: 4.2 g/dL (ref 3.5–5.0)
Alkaline Phosphatase: 54 U/L (ref 38–126)
Anion gap: 10 (ref 5–15)
BUN: 17 mg/dL (ref 8–23)
CO2: 25 mmol/L (ref 22–32)
Calcium: 9.4 mg/dL (ref 8.9–10.3)
Chloride: 108 mmol/L (ref 98–111)
Creatinine, Ser: 0.95 mg/dL (ref 0.44–1.00)
GFR calc Af Amer: >60 mL/min (ref >60)
GFR calc non Af Amer: 55 mL/min — ABNORMAL LOW (ref >60)
Glucose, Bld: 98 mg/dL (ref 70–99)
Potassium: 4.6 mmol/L (ref 3.5–5.1)
Sodium: 143 mmol/L (ref 135–145)
Total Bilirubin: 0.8 mg/dL (ref 0.3–1.2)
Total Protein: 6.5 g/dL (ref 6.5–8.1)

## 2017-11-22 LAB — CBC WITH DIFFERENTIAL/PLATELET
Basophils Absolute: 0 10*3/uL (ref 0.0–0.1)
Basophils Relative: 0 %
Eosinophils Absolute: 0.2 10*3/uL (ref 0.0–0.5)
Eosinophils Relative: 1 %
HCT: 40.1 % (ref 34.8–46.6)
Hemoglobin: 12.9 g/dL (ref 11.6–15.9)
Lymphocytes Relative: 81 %
Lymphs Abs: 17.1 10*3/uL — ABNORMAL HIGH (ref 0.9–3.3)
MCH: 31.2 pg (ref 25.1–34.0)
MCHC: 32.2 g/dL (ref 31.5–36.0)
MCV: 96.9 fL (ref 79.5–101.0)
Monocytes Absolute: 1.6 10*3/uL — ABNORMAL HIGH (ref 0.1–0.9)
Monocytes Relative: 8 %
Neutro Abs: 2.1 10*3/uL (ref 1.5–6.5)
Neutrophils Relative %: 10 %
Platelets: 104 10*3/uL — ABNORMAL LOW (ref 145–400)
RBC: 4.14 MIL/uL (ref 3.70–5.45)
RDW: 13.8 % (ref 11.2–14.5)
WBC: 21 10*3/uL — ABNORMAL HIGH (ref 3.9–10.3)

## 2017-11-22 LAB — LACTATE DEHYDROGENASE: LDH: 398 U/L — ABNORMAL HIGH (ref 98–192)

## 2017-11-22 NOTE — Progress Notes (Signed)
Patient Care Team: [name], MD as PCP - General (Internal Medicine)  DIAGNOSIS:  Encounter Diagnoses  Name Primary?  . CLL (chronic lymphocytic leukemia) ([place]) Yes  . Lymphoma, small lymphocytic ([place])     SUMMARY OF ONCOLOGIC HISTORY:   Breast cancer of upper-outer quadrant of left female breast ([place])   09/16/2010 Surgery    Left breast 1.5 cm invasive ductal carcinoma with calcifications, grade 1, perineural invasion was seen, DCIS, margins negative, 2 sentinel nodes negative, ER 99%, PR negative, Ki-67 79%, HER-2 negative, Oncotype 24, 16% ROR    10/25/2010 -  Anti-estrogen oral therapy    Arimidex 1 mg daily developed side effects and was held briefly and restarted     Lymphoma, small lymphocytic ([place])   11/16/2014 Initial Diagnosis    Lymphoma, small lymphocytic: Based on cervical lymph node biopsy CD5 positive CD10 negative     CHIEF COMPLIANT: Follow-up of SLL/CLL  INTERVAL HISTORY: Brianna York is a 83 year old with above-mentioned history of axilla/CLL who is currently under surveillance.  Recent blood work done in August revealed a significant rise in the WBC count and she was asked to make an appointment to be seen urgently.  She continues to have bulky cervical lymphadenopathy but they are not causing her any problems.  She denies any fevers or chills or night sweats or weight loss.  Denies any loss of appetite or weight.  She is here today accompanied by her son.  REVIEW OF SYSTEMS:   Constitutional: Denies fevers, chills or abnormal weight loss Eyes: Denies blurriness of vision Ears, nose, mouth, throat, and face: Denies mucositis or sore throat Respiratory: Denies cough, dyspnea or wheezes Cardiovascular: Denies palpitation, chest discomfort Gastrointestinal:  Denies nausea, heartburn or change in bowel habits Skin: Denies abnormal skin rashes Lymphatics: Bilateral cervical and submandibular lymphadenopathy Neurological:Denies numbness, tingling or new  weaknesses Behavioral/Psych: Mood is stable, no new changes  Extremities: No lower extremity edema   All other systems were reviewed with the patient and are negative.  I have reviewed the past medical history, past surgical history, social history and family history with the patient and they are unchanged from previous note.  ALLERGIES:  is allergic to ciprofloxacin; loratadine-pseudoephedrine er; penicillins; and tolterodine tartrate.  MEDICATIONS:  Current Outpatient Medications  Medication Sig Dispense Refill  . alendronate (FOSAMAX) 70 MG tablet Take 70 mg by mouth once a week. Take with a full glass of water on an empty stomach.    Marland Kitchen atorvastatin (LIPITOR) 10 MG tablet Take 10 mg by mouth daily.    . Vitamin D, Ergocalciferol, (DRISDOL) 50000 UNITS CAPS capsule Take 50,000 Units by mouth every 7 (seven) days.     No current facility-administered medications for this visit.     PHYSICAL EXAMINATION: ECOG PERFORMANCE STATUS: 1 - Symptomatic but completely ambulatory  Vitals:   11/22/17 0936  BP: (!) 135/59  Pulse: 79  Resp: 16  Temp: 98.6 F (37 C)  SpO2: 96%   Filed Weights   11/22/17 0936  Weight: 131 lb 12.8 oz (59.8 kg)    GENERAL:alert, no distress and comfortable SKIN: skin color, texture, turgor are normal, no rashes or significant lesions EYES: normal, Conjunctiva are pink and non-injected, sclera clear OROPHARYNX:no exudate, no erythema and lips, buccal mucosa, and tongue normal  NECK: supple, thyroid normal size, non-tender, without nodularity LYMPH:  no palpable lymphadenopathy in the cervical, axillary or inguinal LUNGS: clear to auscultation and percussion with normal breathing effort HEART: regular rate & rhythm  and no murmurs and no lower extremity edema ABDOMEN:abdomen soft, non-tender and normal bowel sounds MUSCULOSKELETAL:no cyanosis of digits and no clubbing  NEURO: alert & oriented x 3 with fluent speech, no focal motor/sensory  deficits EXTREMITIES: No lower extremity edema   LABORATORY DATA:  I have reviewed the data as listed CMP Latest Ref Rng & Units 11/22/2017 02/16/2017 11/17/2016  Glucose 70 - 99 mg/dL 98 108 90  BUN 8 - 23 mg/dL 17 22.7 13.6  Creatinine 0.44 - 1.00 mg/dL 0.95 1.0 1.0  Sodium 135 - 145 mmol/L 143 141 141  Potassium 3.5 - 5.1 mmol/L 4.6 3.9 4.5  Chloride 98 - 111 mmol/L 108 - -  CO2 22 - 32 mmol/L 25 24 28   Calcium 8.9 - 10.3 mg/dL 9.4 8.9 10.1  Total Protein 6.5 - 8.1 g/dL 6.5 6.3(L) 6.4  Total Bilirubin 0.3 - 1.2 mg/dL 0.8 0.61 0.53  Alkaline Phos 38 - 126 U/L 54 54 56  AST 15 - 41 U/L 23 28 26   ALT 0 - 44 U/L 11 16 15     Lab Results  Component Value Date   WBC 21.0 (H) 11/22/2017   HGB 12.9 11/22/2017   HCT 40.1 11/22/2017   MCV 96.9 11/22/2017   PLT 104 (L) 11/22/2017   NEUTROABS 2.1 11/22/2017    ASSESSMENT & PLAN:  Lymphoma, small lymphocytic Stage I CLL/SLL multiple cervical lymphadenopathy Absolute lymphocyte count: 17.1 (previously it was 9.3 nine months ago), hemoglobin 12.9, platelet count 104 August 2019 at her PCP office: Absolute lymphocyte count: 21.8, Hemoglobin 13, platelet count 102  Lab review: I discussed the results of August and September blood work with the patient.  Recently she is experiencing symptoms of colitis.  It is unclear if they rise in the lymphocyte count is related to colitis or whether it is related to CLL/SLL.  She is otherwise asymptomatic and there is no indication to treat at this time.  Elevated LDH: Is a tumor marker for lymphoma but it could also be affected by other conditions.  I suspect that her CLL is starting to get more active.  If that were to continue then we may have to discuss treatment options.  I discussed with her about rechecking her blood counts in 6 months and follow-up after that.     Orders Placed This Encounter  Procedures  . CBC with Differential (Cancer Center Only)    Standing Status:   Future    Standing  Expiration Date:   11/23/2018  . CMP ([facility] only)    Standing Status:   Future    Standing Expiration Date:   11/23/2018  . Lactate dehydrogenase (LDH)    Standing Status:   Future    Standing Expiration Date:   11/22/2018   The patient has a good understanding of the overall plan. she agrees with it. she will call with any problems that may develop before the next visit here.   Harriette Ohara, MD 11/22/17

## 2017-11-22 NOTE — Telephone Encounter (Signed)
Per 9/9 los.  Gave patient avs and calendar.

## 2017-11-22 NOTE — Assessment & Plan Note (Signed)
Stage I CLL/SLL multiple cervical lymphadenopathy Absolute lymphocyte count: 17.1 (previously it was 9.3 nine months ago), hemoglobin 12.9, platelet count 104 August 2019 at her PCP office: Absolute lymphocyte count: 21.8, Hemoglobin 13, platelet count 102  Lab review: I discussed the results of August and September blood work with the patient.  Recently she is experiencing symptoms of colitis.  It is unclear if they rise in the lymphocyte count is related to colitis or whether it is related to CLL/SLL.  She is otherwise asymptomatic and there is no indication to treat at this time.  Elevated LDH: Is a tumor marker for lymphoma but it could also be affected by other conditions.  I suspect that her CLL is starting to get more active.  If that were to continue then we may have to discuss treatment options.  I discussed with her about rechecking her blood counts in 6 months and follow-up after that.

## 2017-12-08 DIAGNOSIS — H04322 Acute dacryocystitis of left lacrimal passage: Secondary | ICD-10-CM | POA: Diagnosis not present

## 2017-12-14 DIAGNOSIS — H26491 Other secondary cataract, right eye: Secondary | ICD-10-CM | POA: Diagnosis not present

## 2017-12-14 DIAGNOSIS — H35033 Hypertensive retinopathy, bilateral: Secondary | ICD-10-CM | POA: Diagnosis not present

## 2017-12-14 DIAGNOSIS — H04123 Dry eye syndrome of bilateral lacrimal glands: Secondary | ICD-10-CM | POA: Diagnosis not present

## 2017-12-14 DIAGNOSIS — Z961 Presence of intraocular lens: Secondary | ICD-10-CM | POA: Diagnosis not present

## 2017-12-14 DIAGNOSIS — H04322 Acute dacryocystitis of left lacrimal passage: Secondary | ICD-10-CM | POA: Diagnosis not present

## 2017-12-28 DIAGNOSIS — Z23 Encounter for immunization: Secondary | ICD-10-CM | POA: Diagnosis not present

## 2017-12-31 ENCOUNTER — Other Ambulatory Visit: Payer: Self-pay | Admitting: Internal Medicine

## 2017-12-31 DIAGNOSIS — Z1231 Encounter for screening mammogram for malignant neoplasm of breast: Secondary | ICD-10-CM

## 2018-01-03 DIAGNOSIS — H26491 Other secondary cataract, right eye: Secondary | ICD-10-CM | POA: Diagnosis not present

## 2018-01-13 DIAGNOSIS — H04223 Epiphora due to insufficient drainage, bilateral lacrimal glands: Secondary | ICD-10-CM | POA: Diagnosis not present

## 2018-01-13 DIAGNOSIS — H04123 Dry eye syndrome of bilateral lacrimal glands: Secondary | ICD-10-CM | POA: Diagnosis not present

## 2018-01-13 DIAGNOSIS — H04412 Chronic dacryocystitis of left lacrimal passage: Secondary | ICD-10-CM | POA: Diagnosis not present

## 2018-01-13 DIAGNOSIS — H04553 Acquired stenosis of bilateral nasolacrimal duct: Secondary | ICD-10-CM | POA: Diagnosis not present

## 2018-01-13 DIAGNOSIS — J342 Deviated nasal septum: Secondary | ICD-10-CM | POA: Diagnosis not present

## 2018-02-07 ENCOUNTER — Ambulatory Visit: Payer: Self-pay

## 2018-02-15 ENCOUNTER — Other Ambulatory Visit: Payer: Self-pay

## 2018-02-15 ENCOUNTER — Ambulatory Visit: Payer: Self-pay | Admitting: Hematology and Oncology

## 2018-02-21 ENCOUNTER — Other Ambulatory Visit: Payer: Self-pay | Admitting: Ophthalmology

## 2018-02-21 DIAGNOSIS — H04223 Epiphora due to insufficient drainage, bilateral lacrimal glands: Secondary | ICD-10-CM | POA: Diagnosis not present

## 2018-02-21 DIAGNOSIS — H04222 Epiphora due to insufficient drainage, left lacrimal gland: Secondary | ICD-10-CM | POA: Diagnosis not present

## 2018-02-21 DIAGNOSIS — H04552 Acquired stenosis of left nasolacrimal duct: Secondary | ICD-10-CM | POA: Diagnosis not present

## 2018-02-21 DIAGNOSIS — H0489 Other disorders of lacrimal system: Secondary | ICD-10-CM | POA: Diagnosis not present

## 2018-02-21 DIAGNOSIS — H04412 Chronic dacryocystitis of left lacrimal passage: Secondary | ICD-10-CM | POA: Diagnosis not present

## 2018-02-21 DIAGNOSIS — H04553 Acquired stenosis of bilateral nasolacrimal duct: Secondary | ICD-10-CM | POA: Diagnosis not present

## 2018-02-21 DIAGNOSIS — J342 Deviated nasal septum: Secondary | ICD-10-CM | POA: Diagnosis not present

## 2018-03-10 DIAGNOSIS — Z09 Encounter for follow-up examination after completed treatment for conditions other than malignant neoplasm: Secondary | ICD-10-CM | POA: Diagnosis not present

## 2018-03-10 DIAGNOSIS — C9190 Lymphoid leukemia, unspecified not having achieved remission: Secondary | ICD-10-CM | POA: Diagnosis not present

## 2018-03-10 DIAGNOSIS — H04223 Epiphora due to insufficient drainage, bilateral lacrimal glands: Secondary | ICD-10-CM | POA: Diagnosis not present

## 2018-03-10 DIAGNOSIS — J342 Deviated nasal septum: Secondary | ICD-10-CM | POA: Diagnosis not present

## 2018-03-10 DIAGNOSIS — H04553 Acquired stenosis of bilateral nasolacrimal duct: Secondary | ICD-10-CM | POA: Diagnosis not present

## 2018-03-10 DIAGNOSIS — H04412 Chronic dacryocystitis of left lacrimal passage: Secondary | ICD-10-CM | POA: Diagnosis not present

## 2018-04-05 DIAGNOSIS — C9190 Lymphoid leukemia, unspecified not having achieved remission: Secondary | ICD-10-CM | POA: Diagnosis not present

## 2018-04-05 DIAGNOSIS — H04223 Epiphora due to insufficient drainage, bilateral lacrimal glands: Secondary | ICD-10-CM | POA: Diagnosis not present

## 2018-04-05 DIAGNOSIS — Z09 Encounter for follow-up examination after completed treatment for conditions other than malignant neoplasm: Secondary | ICD-10-CM | POA: Diagnosis not present

## 2018-04-05 DIAGNOSIS — J342 Deviated nasal septum: Secondary | ICD-10-CM | POA: Diagnosis not present

## 2018-04-05 DIAGNOSIS — H04412 Chronic dacryocystitis of left lacrimal passage: Secondary | ICD-10-CM | POA: Diagnosis not present

## 2018-04-05 DIAGNOSIS — H04553 Acquired stenosis of bilateral nasolacrimal duct: Secondary | ICD-10-CM | POA: Diagnosis not present

## 2018-04-25 DIAGNOSIS — H04551 Acquired stenosis of right nasolacrimal duct: Secondary | ICD-10-CM | POA: Diagnosis not present

## 2018-05-09 ENCOUNTER — Encounter: Payer: Self-pay | Admitting: Gastroenterology

## 2018-05-13 ENCOUNTER — Encounter (INDEPENDENT_AMBULATORY_CARE_PROVIDER_SITE_OTHER): Payer: Self-pay

## 2018-05-13 ENCOUNTER — Ambulatory Visit (INDEPENDENT_AMBULATORY_CARE_PROVIDER_SITE_OTHER): Payer: Medicare Other | Admitting: Gastroenterology

## 2018-05-13 ENCOUNTER — Other Ambulatory Visit (INDEPENDENT_AMBULATORY_CARE_PROVIDER_SITE_OTHER): Payer: Medicare Other

## 2018-05-13 ENCOUNTER — Encounter: Payer: Self-pay | Admitting: Gastroenterology

## 2018-05-13 VITALS — BP 110/74 | HR 68 | Ht <= 58 in | Wt 126.2 lb

## 2018-05-13 DIAGNOSIS — R634 Abnormal weight loss: Secondary | ICD-10-CM | POA: Diagnosis not present

## 2018-05-13 DIAGNOSIS — R197 Diarrhea, unspecified: Secondary | ICD-10-CM

## 2018-05-13 LAB — SEDIMENTATION RATE: Sed Rate: 12 mm/hr (ref 0–30)

## 2018-05-13 LAB — TSH: TSH: 2.35 u[IU]/mL (ref 0.35–4.50)

## 2018-05-13 LAB — C-REACTIVE PROTEIN: CRP: <1 mg/dL (ref 0.5–20.0)

## 2018-05-13 MED ORDER — SUPREP BOWEL PREP KIT 17.5-3.13-1.6 GM/177ML PO SOLN: 1.0000 | ORAL | 0 refills | Status: DC

## 2018-05-13 NOTE — Progress Notes (Signed)
Referring Provider: [name], MD Primary Care Physician:  [name], MD   Reason for Consultation:  Diarrhea and weight loss   IMPRESSION:  Diarrhea - predominantly post-prandial Unintentional weight loss of 20 pounds History of colon polyps    -Tubular adenoma on colonoscopy 2004    -Normal colonoscopies 2009 in 2014 Diverticulosis Family history of colon cancer in (mother) Family history of celiac (daughter) Family history of IBS (granddaughter)  Several year history of postprandial diarrhea now with progressive symptoms.  She associates a 20 pound weight loss over the last 3 years with these symptoms.  We will screen for common causes of chronic diarrhea including infections, thyroid dysfunction, inflammatory bowel disease.  We will proceed with EGD with duodenal biopsies to evaluate for celiac disease and colonoscopy with random biopsies to screen for microscopic colitis as well as leukemic involvement of the GI tract.  Symptoms may be a variant of a functional GI syndrome.  A trial of Bentyl may be helpful for somatic relief of rapid gastric emptying.  PLAN: Obtain recent records from Dr. Joylene Draft ESR, CRP, fecal calprotectin, Giardia, TSH EGD with duodenal biopsies and colonoscopy with biopsies  I consented the patient at the bedside today discussing the risks, benefits, and alternatives to endoscopic evaluation. In particular, we discussed the risks that include, but are not limited to, reaction to medication, cardiopulmonary compromise, bleeding requiring blood transfusion, aspiration resulting in pneumonia, perforation requiring surgery, lack of diagnosis, severe illness requiring hospitalization, and even death. We reviewed the risk of missed lesion including polyps or even cancer. The patient acknowledges these risks and asks that we proceed.   HPI: Brianna York is a 84 y.o. female self-referred for weight loss and diarrhea.  The history is obtained through the  patient and review of her electronic health record.  She is unaccompanied to this appointment.  She has stage I CLL/SLL with bulky cervical lymphadenopathy.  She has a history of left breast cancer.  She reported symptoms worrisome for colitis to her oncologist, Dr. Payton Mccallum, and gastroenterology consultation was recommended.  However, she declined to make the GI appointment at that time as her symptoms improved without intervention.  She calls now due to ongoing symptoms.  Post-prandial diarrhea x 3 years, worse over the last few months. Often 4-5 BM after eating. First BM starts about 60 minutes after eating. No abdominal pain. No bloating, distension, eructation, or flatus. No identified triggers.  Prevents her from traveling.  No blood or stool. Good appetite. Poor energy. "I could sleep my life away."  No early satiety or anorexia.   Granddaughter with IBS. Daughter with celiac disease.   She has lost 20 pounds over the last 3 years without trying. No other associated symptoms. No identified exacerbating or relieving features.   Colonoscopies for a family history of colon cancer in her mother with Dr. Sharlett Iles 05/10/2002, 6/1/209 and 01/09/2013 were normal set for a 5 mm tubular adenoma removed in 2004.  Diverticulosis was seen.  Mother with colon cancer diagnosed in her 86s. Maternal aunt with colon cancer in her 54s.   She is concerned about colon cancer, IBS, colitis, and celiac.     Past Medical History:  Diagnosis Date  . Adenomatous colon polyp    tubular  . Arthritis    osteo arthritis  . Breast cancer ([place]) 02/16/2011  . Diverticulosis   . GERD (gastroesophageal reflux disease)   . Hyperlipidemia   . Leukemia ([place])   . Lymphoma ([place])  small lympocytic  . Osteopenia     Past Surgical History:  Procedure Laterality Date  . bartholian cyst  1986  . BREAST LUMPECTOMY Left 09-16-2010  . CATARACT EXTRACTION, BILATERAL    . [place]  . KNEE  ARTHROSCOPY Right 2010  . LYMPH GLAND EXCISION Right 11/16/2014   Procedure: EXCISION AND BIOPSY OF RIGHT CERVICAL LYMPH NODE;  Surgeon: Jackolyn Confer, MD;  Location: [facility];  Service: General;  Laterality: Right;  . TONSILLECTOMY  1940    Current Outpatient Medications  Medication Sig Dispense Refill  . alendronate (FOSAMAX) 70 MG tablet Take 70 mg by mouth once a week. Take with a full glass of water on an empty stomach.    Marland Kitchen atorvastatin (LIPITOR) 10 MG tablet Take 10 mg by mouth daily.    . Vitamin D, Ergocalciferol, (DRISDOL) 50000 UNITS CAPS capsule Take 50,000 Units by mouth every 7 (seven) days.     No current facility-administered medications for this visit.     Allergies as of 05/13/2018 - Review Complete 05/13/2018  Allergen Reaction Noted  . Ciprofloxacin Nausea Only 12/20/2012  . Loratadine-pseudoephedrine er  08/01/2007  . Penicillins  08/01/2007  . Tolterodine tartrate  08/01/2007    Family History  Problem Relation Age of Onset  . Colon cancer Mother 16  . Colon cancer Maternal Aunt        had colon sugery  . Leukemia Father   . Diabetes Brother   . Diabetes Brother     Social History   Socioeconomic History  . Marital status: Widowed    Spouse name: Not on file  . Number of children: 5  . Years of education: Not on file  . Highest education level: Not on file  Occupational History  . Occupation: retired  Scientific laboratory technician  . Financial resource strain: Not on file  . Food insecurity:    Worry: Not on file    Inability: Not on file  . Transportation needs:    Medical: Not on file    Non-medical: Not on file  Tobacco Use  . Smoking status: Former Smoker    Types: Cigarettes    Last attempt to quit: 1989    Years since quitting: 31.1  . Smokeless tobacco: Never Used  . Tobacco comment: Quit smoking 27 years ago  Substance and Sexual Activity  . Alcohol use: Yes    Comment: occasional glass wine  . Drug use: No  . Sexual  activity: Not Currently  Lifestyle  . Physical activity:    Days per week: Not on file    Minutes per session: Not on file  . Stress: Not on file  Relationships  . Social connections:    Talks on phone: Not on file    Gets together: Not on file    Attends religious service: Not on file    Active member of club or organization: Not on file    Attends meetings of clubs or organizations: Not on file    Relationship status: Not on file  . Intimate partner violence:    Fear of current or ex partner: Not on file    Emotionally abused: Not on file    Physically abused: Not on file    Forced sexual activity: Not on file  Other Topics Concern  . Not on file  Social History Narrative  . Not on file    Review of Systems: 12 system ROS is negative except as noted  above with the addition of arthritis (not a current problem) in the knee, back pain, and swollen lymph nodes.  Filed Weights   05/13/18 0932  Weight: 126 lb 4 oz (57.3 kg)    Physical Exam: Vital signs were reviewed. General:   Alert, well-nourished, pleasant and cooperative in NAD. Appears younger than her stated age.  Head:  Normocephalic and atraumatic. Eyes:  Sclera clear, no icterus.   Conjunctiva pink. Mouth:  No deformity or lesions.   Neck:  Supple; no thyromegaly. Palpable nodes.  Lungs:  Clear throughout to auscultation.   No wheezes.  Heart:  Regular rate and rhythm; no murmurs Abdomen:  Soft, nontender, normal bowel sounds. No rebound or guarding. No hepatosplenomegaly.  No inguinal or umbilical lymphadenopathy. Rectal:  Deferred  Msk:  Symmetrical without gross deformities. Extremities:  No gross deformities or edema. Neurologic:  Alert and  oriented x4;  grossly nonfocal Skin:  No rash or bruise. Psych:  Alert and cooperative. Normal mood and affect.   Felicia Bloomquist L. Tarri Glenn, MD, MPH [facility] 05/13/2018, 10:10 AM

## 2018-05-13 NOTE — Patient Instructions (Addendum)
If you are age 82 or older, your body mass index should be between 23-30. Your Body mass index is 27.32 kg/m. If this is out of the aforementioned range listed, please consider follow up with your Primary Care Provider.  If you are age 34 or younger, your body mass index should be between 19-25. Your Body mass index is 27.32 kg/m. If this is out of the aformentioned range listed, please consider follow up with your Primary Care Provider.   You have been scheduled for an endoscopy and colonoscopy. Please follow the written instructions given to you at your visit today. Please pick up your prep supplies at the pharmacy within the next 1-3 days. If you use inhalers (even only as needed), please bring them with you on the day of your procedure. Your physician has requested that you go to www.startemmi.com and enter the access code given to you at your visit today. This web site gives a general overview about your procedure. However, you should still follow specific instructions given to you by our office regarding your preparation for the procedure.  Your provider has requested that you go to the basement level for lab work before leaving today. Press "B" on the elevator. The lab is located at the first door on the left as you exit the elevator.  Thank you for choosing me and [facility].  Dr. Thornton Park

## 2018-05-16 ENCOUNTER — Other Ambulatory Visit: Payer: Medicare Other

## 2018-05-16 DIAGNOSIS — R634 Abnormal weight loss: Secondary | ICD-10-CM | POA: Diagnosis not present

## 2018-05-16 DIAGNOSIS — R197 Diarrhea, unspecified: Secondary | ICD-10-CM

## 2018-05-17 LAB — GIARDIA ANTIGEN
MICRO NUMBER:: 263346
RESULT:: NOT DETECTED
SPECIMEN QUALITY:: ADEQUATE

## 2018-05-19 ENCOUNTER — Encounter: Payer: Self-pay | Admitting: Gastroenterology

## 2018-05-19 ENCOUNTER — Ambulatory Visit (AMBULATORY_SURGERY_CENTER): Payer: Medicare Other | Admitting: Gastroenterology

## 2018-05-19 VITALS — BP 132/50 | HR 77 | Resp 14 | Ht <= 58 in | Wt 126.0 lb

## 2018-05-19 DIAGNOSIS — K269 Duodenal ulcer, unspecified as acute or chronic, without hemorrhage or perforation: Secondary | ICD-10-CM | POA: Diagnosis not present

## 2018-05-19 DIAGNOSIS — D124 Benign neoplasm of descending colon: Secondary | ICD-10-CM | POA: Diagnosis not present

## 2018-05-19 DIAGNOSIS — K2980 Duodenitis without bleeding: Secondary | ICD-10-CM

## 2018-05-19 DIAGNOSIS — K2970 Gastritis, unspecified, without bleeding: Secondary | ICD-10-CM | POA: Diagnosis not present

## 2018-05-19 DIAGNOSIS — R197 Diarrhea, unspecified: Secondary | ICD-10-CM

## 2018-05-19 DIAGNOSIS — B9681 Helicobacter pylori [H. pylori] as the cause of diseases classified elsewhere: Secondary | ICD-10-CM | POA: Diagnosis not present

## 2018-05-19 DIAGNOSIS — D120 Benign neoplasm of cecum: Secondary | ICD-10-CM | POA: Diagnosis not present

## 2018-05-19 DIAGNOSIS — R634 Abnormal weight loss: Secondary | ICD-10-CM

## 2018-05-19 DIAGNOSIS — R194 Change in bowel habit: Secondary | ICD-10-CM | POA: Diagnosis not present

## 2018-05-19 MED ORDER — OMEPRAZOLE 40 MG PO CPDR: 40.0000 mg | DELAYED_RELEASE_CAPSULE | Freq: Two times a day (BID) | ORAL | 0 refills | Status: DC

## 2018-05-19 MED ORDER — SODIUM CHLORIDE 0.9 % IV SOLN: 500.0000 mL | Freq: Once | INTRAVENOUS | Status: DC

## 2018-05-19 NOTE — Patient Instructions (Signed)
Continue present medications. Start Omeprazole 40 mg twice daily for 8 weeks. Await pathology results. Avoid all NSAIDS. Please read all handouts provided.       YOU HAD AN ENDOSCOPIC PROCEDURE TODAY AT [facility]:   Refer to the procedure report that was given to you for any specific questions about what was found during the examination.  If the procedure report does not answer your questions, please call your gastroenterologist to clarify.  If you requested that your care partner not be given the details of your procedure findings, then the procedure report has been included in a sealed envelope for you to review at your convenience later.  YOU SHOULD EXPECT: Some feelings of bloating in the abdomen. Passage of more gas than usual.  Walking can help get rid of the air that was put into your GI tract during the procedure and reduce the bloating. If you had a lower endoscopy (such as a colonoscopy or flexible sigmoidoscopy) you may notice spotting of blood in your stool or on the toilet paper. If you underwent a bowel prep for your procedure, you may not have a normal bowel movement for a few days.  Please Note:  You might notice some irritation and congestion in your nose or some drainage.  This is from the oxygen used during your procedure.  There is no need for concern and it should clear up in a day or so.  SYMPTOMS TO REPORT IMMEDIATELY:   Following lower endoscopy (colonoscopy or flexible sigmoidoscopy):  Excessive amounts of blood in the stool  Significant tenderness or worsening of abdominal pains  Swelling of the abdomen that is new, acute  Fever of 100F or higher   Following upper endoscopy (EGD)  Vomiting of blood or coffee ground material  New chest pain or pain under the shoulder blades  Painful or persistently difficult swallowing  New shortness of breath  Fever of 100F or higher  Black, tarry-looking stools  For urgent or emergent issues, a  gastroenterologist can be reached at any hour by calling [phone].   DIET:  We do recommend a small meal at first, but then you may proceed to your regular diet.  Drink plenty of fluids but you should avoid alcoholic beverages for 24 hours.  ACTIVITY:  You should plan to take it easy for the rest of today and you should NOT DRIVE or use heavy machinery until tomorrow (because of the sedation medicines used during the test).    FOLLOW UP: Our staff will call the number listed on your records the next business day following your procedure to check on you and address any questions or concerns that you may have regarding the information given to you following your procedure. If we do not reach you, we will leave a message.  However, if you are feeling well and you are not experiencing any problems, there is no need to return our call.  We will assume that you have returned to your regular daily activities without incident.  If any biopsies were taken you will be contacted by phone or by letter within the next 1-3 weeks.  Please call us at [phone] if you have not heard about the biopsies in 3 weeks.    SIGNATURES/CONFIDENTIALITY: You and/or your care partner have signed paperwork which will be entered into your electronic medical record.  These signatures attest to the fact that that the information above on your After Visit Summary has been reviewed and is  understood.  Full responsibility of the confidentiality of this discharge information lies with you and/or your care-partner. 

## 2018-05-19 NOTE — Progress Notes (Signed)
PT taken to PACU. Monitors in place. VSS. Report given to RN. 

## 2018-05-19 NOTE — Op Note (Signed)
[place] Patient Name: Brianna York Procedure Date: 05/19/2018 2:34 PM MRN: [id number] Endoscopist: Thornton Park MD, MD Age: 84 Referring MD:  Date of Birth: 10/30/1934 Gender: Female Account #: [id number] Procedure:                Upper GI endoscopy Indications:              Diarrhea, Weight loss (20 pounds) Medicines:                See the Anesthesia note for documentation of the                            administered medications Procedure:                Pre-Anesthesia Assessment:                           - Prior to the procedure, a History and Physical                            was performed, and patient medications and                            allergies were reviewed. The patient's tolerance of                            previous anesthesia was also reviewed. The risks                            and benefits of the procedure and the sedation                            options and risks were discussed with the patient.                            All questions were answered, and informed consent                            was obtained. Prior Anticoagulants: The patient has                            taken no previous anticoagulant or antiplatelet                            agents. ASA Grade Assessment: III - A patient with                            severe systemic disease. After reviewing the risks                            and benefits, the patient was deemed in                            satisfactory condition to undergo the procedure.  After obtaining informed consent, the endoscope was                            passed under direct vision. Throughout the                            procedure, the patient's blood pressure, pulse, and                            oxygen saturations were monitored continuously. The                            Endoscope was introduced through the mouth, and                            advanced to the  second part of duodenum. The upper                            GI endoscopy was accomplished without difficulty.                            The patient tolerated the procedure well. Scope In: Scope Out: Findings:                 The examined esophagus was normal.                           The entire examined stomach was normal. Biopsies                            were taken with a cold forceps for histology.                            Estimated blood loss was minimal.                           Diffuse mildly erythematous mucosa without active                            bleeding and with no stigmata of bleeding was found                            in the duodenal bulb.                           One non-bleeding linear, superficial duodenal ulcer                            with no stigmata of bleeding was found in the                            duodenal bulb. The lesion was 5 mm in largest  dimension. Biopsies were taken with a cold forceps                            for histology.                           The exam was otherwise without abnormality. Complications:            No immediate complications. Estimated blood loss:                            Minimal. Estimated Blood Loss:     Estimated blood loss was minimal. Impression:               - Normal esophagus.                           - Normal stomach. Biopsied.                           - Erythematous duodenopathy.                           - One non-bleeding duodenal ulcer with no stigmata                            of bleeding. Biopsied.                           - The examination was otherwise normal. Recommendation:           - Patient has a contact number available for                            emergencies. The signs and symptoms of potential                            delayed complications were discussed with the                            patient. Return to normal activities tomorrow.                             Written discharge instructions were provided to the                            patient.                           - Resume regular diet today.                           - Continue present medications.                           - Start omeprazole 40 mg BID x 8 weeks                           -  Avoid all NSAIDs                           - Await pathology results.                           - No repeat upper endoscopy planned at this time.                           - Proceed with colonoscopy as previously planned. Thornton Park MD, MD 05/19/2018 3:06:40 PM This report has been signed electronically.

## 2018-05-19 NOTE — Op Note (Signed)
[place] Patient Name: Brianna York Procedure Date: 05/19/2018 2:34 PM MRN: [id number] Endoscopist: Thornton Park MD, MD Age: 84 Referring MD:  Date of Birth: 1934/03/17 Gender: Female Account #: [id number] Procedure:                Colonoscopy Indications:              Chronic diarrhea, Weight loss (20 pounds)                           History of colon polyps                           -Tubular adenoma on colonoscopy 2004                           -Normal colonoscopies 2009 in 2014                           Diverticulosis                           Family history of colon cancer in (mother)                           Family history of celiac (daughter)                           Family history of IBS (granddaughter) Medicines:                See the Anesthesia note for documentation of the                            administered medications Procedure:                Pre-Anesthesia Assessment:                           - Prior to the procedure, a History and Physical                            was performed, and patient medications and                            allergies were reviewed. The patient's tolerance of                            previous anesthesia was also reviewed. The risks                            and benefits of the procedure and the sedation                            options and risks were discussed with the patient.                            All questions were answered, and informed consent  was obtained. Prior Anticoagulants: The patient has                            taken no previous anticoagulant or antiplatelet                            agents. ASA Grade Assessment: III - A patient with                            severe systemic disease. After reviewing the risks                            and benefits, the patient was deemed in                            satisfactory condition to undergo the procedure.                            After obtaining informed consent, the colonoscope                            was passed under direct vision. Throughout the                            procedure, the patient's blood pressure, pulse, and                            oxygen saturations were monitored continuously. The                            Colonoscope was introduced through the anus and                            advanced to the the cecum, identified by the                            appendiceal orifice, ileocecal valve and palpation.                            The colonoscopy was performed with moderate                            difficulty due to inadequate bowel prep. The                            patient tolerated the procedure well. The quality                            of the bowel preparation was poor. The ileocecal                            valve, appendiceal orifice, and rectum were  photographed. Scope In: 2:45:38 PM Scope Out: 3:00:26 PM Scope Withdrawal Time: 0 hours 8 minutes 32 seconds  Total Procedure Duration: 0 hours 14 minutes 48 seconds  Findings:                 The perianal and digital rectal examinations were                            normal.                           A large amount of stool was found in the entire                            colon, interfering with visualization. Evaluation                            for polyps could not be completely performed due to                            the volume of retained stool.                           A 4 mm polyp was found in the cecum. The polyp was                            sessile. The polyp was removed with a cold snare.                            Resection and retrieval were complete. Estimated                            blood loss: none.                           A 1 mm polyp was found in the descending colon. The                            polyp was sessile. The polyp was removed with a                             cold biopsy forceps. Resection and retrieval were                            complete. Estimated blood loss was minimal.                           The exam was otherwise without abnormality on                            direct and retroflexion views. Complications:            No immediate complications. Estimated blood loss:  Minimal. Estimated Blood Loss:     Estimated blood loss was minimal. Impression:               - Preparation of the colon was poor.                           - Stool in the entire examined colon. The residual                            stool limited any meaningful evaluation for colon                            polyps.                           - One 4 mm polyp in the cecum, removed with a cold                            snare. Resected and retrieved.                           - One 1 mm polyp in the descending colon, removed                            with a cold biopsy forceps. Resected and retrieved.                           - The examination was otherwise normal on direct                            and retroflexion views. Recommendation:           - Patient has a contact number available for                            emergencies. The signs and symptoms of potential                            delayed complications were discussed with the                            patient. Return to normal activities tomorrow.                            Written discharge instructions were provided to the                            patient.                           - Resume regular diet today.                           - Continue present medications.                           -  Await pathology results.                           - No repeat colonoscopy planned given her age.                           - Follow-up in the office to review these results. Thornton Park MD, MD 05/19/2018 3:13:05 PM This report has been signed electronically.

## 2018-05-20 ENCOUNTER — Telehealth: Payer: Self-pay

## 2018-05-20 NOTE — Telephone Encounter (Signed)
  Follow up Call-  Call back number 05/19/2018  Post procedure Call Back phone  # [phone]  Permission to leave phone message Yes  Some recent data might be hidden     Patient questions:  Do you have a fever, pain , or abdominal swelling? No. Pain Score  0 *  Have you tolerated food without any problems? Yes.    Have you been able to return to your normal activities? Yes.    Do you have any questions about your discharge instructions: Diet   No. Medications  No. Follow up visit  No.  Do you have questions or concerns about your Care? No.  Actions: * If pain score is 4 or above: No action needed, pain <4.

## 2018-05-23 ENCOUNTER — Inpatient Hospital Stay: Payer: Medicare Other | Attending: Hematology and Oncology

## 2018-05-23 ENCOUNTER — Inpatient Hospital Stay (HOSPITAL_BASED_OUTPATIENT_CLINIC_OR_DEPARTMENT_OTHER): Payer: Medicare Other | Admitting: Hematology and Oncology

## 2018-05-23 DIAGNOSIS — C9110 Chronic lymphocytic leukemia of B-cell type not having achieved remission: Secondary | ICD-10-CM

## 2018-05-23 DIAGNOSIS — Z170 Estrogen receptor positive status [ER+]: Secondary | ICD-10-CM | POA: Diagnosis not present

## 2018-05-23 DIAGNOSIS — Z79899 Other long term (current) drug therapy: Secondary | ICD-10-CM | POA: Diagnosis not present

## 2018-05-23 DIAGNOSIS — C50412 Malignant neoplasm of upper-outer quadrant of left female breast: Secondary | ICD-10-CM | POA: Diagnosis not present

## 2018-05-23 DIAGNOSIS — Z79811 Long term (current) use of aromatase inhibitors: Secondary | ICD-10-CM

## 2018-05-23 DIAGNOSIS — C8300 Small cell B-cell lymphoma, unspecified site: Secondary | ICD-10-CM

## 2018-05-23 DIAGNOSIS — R634 Abnormal weight loss: Secondary | ICD-10-CM

## 2018-05-23 LAB — CBC WITH DIFFERENTIAL (CANCER CENTER ONLY)
Abs Immature Granulocytes: 0.06 10*3/uL (ref 0.00–0.07)
Basophils Absolute: 0.1 10*3/uL (ref 0.0–0.1)
Basophils Relative: 0 %
Eosinophils Absolute: 0.3 10*3/uL (ref 0.0–0.5)
Eosinophils Relative: 1 %
HCT: 39.3 % (ref 36.0–46.0)
Hemoglobin: 12.4 g/dL (ref 12.0–15.0)
Immature Granulocytes: 0 %
Lymphocytes Relative: 85 %
Lymphs Abs: 27.4 10*3/uL — ABNORMAL HIGH (ref 0.7–4.0)
MCH: 31.1 pg (ref 26.0–34.0)
MCHC: 31.6 g/dL (ref 30.0–36.0)
MCV: 98.5 fL (ref 80.0–100.0)
Monocytes Absolute: 2.1 10*3/uL — ABNORMAL HIGH (ref 0.1–1.0)
Monocytes Relative: 7 %
Neutro Abs: 2.2 10*3/uL (ref 1.7–7.7)
Neutrophils Relative %: 7 %
Platelet Count: 112 10*3/uL — ABNORMAL LOW (ref 150–400)
RBC: 3.99 MIL/uL (ref 3.87–5.11)
RDW: 13.4 % (ref 11.5–15.5)
WBC Count: 32 10*3/uL — ABNORMAL HIGH (ref 4.0–10.5)
nRBC: 0 % (ref 0.0–0.2)

## 2018-05-23 LAB — COMPREHENSIVE METABOLIC PANEL
ALT: 10 U/L (ref 0–44)
AST: 25 U/L (ref 15–41)
Albumin: 3.9 g/dL (ref 3.5–5.0)
Alkaline Phosphatase: 58 U/L (ref 38–126)
Anion gap: 12 (ref 5–15)
BUN: 11 mg/dL (ref 8–23)
CO2: 23 mmol/L (ref 22–32)
Calcium: 9.2 mg/dL (ref 8.9–10.3)
Chloride: 108 mmol/L (ref 98–111)
Creatinine, Ser: 0.99 mg/dL (ref 0.44–1.00)
GFR calc Af Amer: >60 mL/min (ref >60)
GFR calc non Af Amer: 53 mL/min — ABNORMAL LOW (ref >60)
Glucose, Bld: 80 mg/dL (ref 70–99)
Potassium: 4.3 mmol/L (ref 3.5–5.1)
Sodium: 143 mmol/L (ref 135–145)
Total Bilirubin: 0.5 mg/dL (ref 0.3–1.2)
Total Protein: 6.5 g/dL (ref 6.5–8.1)

## 2018-05-23 LAB — LACTATE DEHYDROGENASE: LDH: 444 U/L — ABNORMAL HIGH (ref 98–192)

## 2018-05-23 NOTE — Assessment & Plan Note (Signed)
Stage I CLL/SLL multiple cervical lymphadenopathy Absolute lymphocyte count: 17.1 (previously it was 9.3 nine months ago), hemoglobin 12.9, platelet count 104 August 2019 at her PCP office: Absolute lymphocyte count: 21.8, Hemoglobin 13, platelet count 102  Lab review: 05/23/2018: Absolute lymphocyte count:  Return to clinic in 6 months with labs and follow-up

## 2018-05-23 NOTE — Progress Notes (Signed)
Patient Care Team: [name], MD as PCP - General (Internal Medicine)  DIAGNOSIS:  Encounter Diagnoses  Name Primary?  . Lymphoma, small lymphocytic ([place])   . Malignant neoplasm of upper-outer quadrant of left breast in female, estrogen receptor positive ([place])     SUMMARY OF ONCOLOGIC HISTORY:   Breast cancer of upper-outer quadrant of left female breast ([place])   09/16/2010 Surgery    Left breast 1.5 cm invasive ductal carcinoma with calcifications, grade 1, perineural invasion was seen, DCIS, margins negative, 2 sentinel nodes negative, ER 99%, PR negative, Ki-67 79%, HER-2 negative, Oncotype 24, 16% ROR    10/25/2010 -  Anti-estrogen oral therapy    Arimidex 1 mg daily developed side effects and was held briefly and restarted     Lymphoma, small lymphocytic ([place])   11/16/2014 Initial Diagnosis    Lymphoma, small lymphocytic: Based on cervical lymph node biopsy CD5 positive CD10 negative     CHIEF COMPLIANT: Follow-up of CLL/SLL  INTERVAL HISTORY: Brianna York is a 84 year old with above-mentioned history of breast cancer but also has a diagnosis CLL/SLL.  She had cervical lymph nodes as well as lymphocytosis in the bloodstream.  She has noticed over the past 6 months 20 pound weight loss as well as fatigue and increase in size of lymphadenopathy in the neck and submandibular areas.  REVIEW OF SYSTEMS:   Constitutional: Denies fevers, chills, 20 pound weight loss Eyes: Denies blurriness of vision Ears, nose, mouth, throat, and face: Denies mucositis or sore throat Respiratory: Denies cough, dyspnea or wheezes Cardiovascular: Denies palpitation, chest discomfort Gastrointestinal:  Denies nausea, heartburn or change in bowel habits Skin: Denies abnormal skin rashes Lymphatics: Bilateral cervical and submandibular and posterior cervical lymphadenopathy Neurological:Denies numbness, tingling or new weaknesses Behavioral/Psych: Mood is stable, no new changes  Extremities:  No lower extremity edema   All other systems were reviewed with the patient and are negative.  I have reviewed the past medical history, past surgical history, social history and family history with the patient and they are unchanged from previous note.  ALLERGIES:  is allergic to ciprofloxacin; loratadine-pseudoephedrine er; penicillins; and tolterodine tartrate.  MEDICATIONS:  Current Outpatient Medications  Medication Sig Dispense Refill  . alendronate (FOSAMAX) 70 MG tablet Take 70 mg by mouth once a week. Take with a full glass of water on an empty stomach.    Marland Kitchen atorvastatin (LIPITOR) 10 MG tablet Take 10 mg by mouth daily.    Marland Kitchen omeprazole (PRILOSEC) 40 MG capsule Take 1 capsule (40 mg total) by mouth 2 (two) times daily. Omeprazole 40 mg twice daily for 8 weeks. 120 capsule 0  . Vitamin D, Ergocalciferol, (DRISDOL) 50000 UNITS CAPS capsule Take 50,000 Units by mouth every 7 (seven) days.     No current facility-administered medications for this visit.     PHYSICAL EXAMINATION: ECOG PERFORMANCE STATUS: 1 - Symptomatic but completely ambulatory  Vitals:   05/23/18 1135  BP: (!) 141/62  Pulse: 72  Resp: 16  Temp: 97.7 F (36.5 C)  SpO2: 99%   Filed Weights   05/23/18 1135  Weight: 127 lb 4.8 oz (57.7 kg)    GENERAL:alert, no distress and comfortable SKIN: skin color, texture, turgor are normal, no rashes or significant lesions EYES: normal, Conjunctiva are pink and non-injected, sclera clear OROPHARYNX:no exudate, no erythema and lips, buccal mucosa, and tongue normal  NECK: supple, thyroid normal size, non-tender, without nodularity LYMPH:  no palpable lymphadenopathy in the cervical, axillary or inguinal LUNGS: clear  to auscultation and percussion with normal breathing effort HEART: regular rate & rhythm and no murmurs and no lower extremity edema ABDOMEN:abdomen soft, non-tender and normal bowel sounds MUSCULOSKELETAL:no cyanosis of digits and no clubbing  NEURO:  alert & oriented x 3 with fluent speech, no focal motor/sensory deficits EXTREMITIES: No lower extremity edema   LABORATORY DATA:  I have reviewed the data as listed CMP Latest Ref Rng & Units 11/22/2017 02/16/2017 11/17/2016  Glucose 70 - 99 mg/dL 98 108 90  BUN 8 - 23 mg/dL 17 22.7 13.6  Creatinine 0.44 - 1.00 mg/dL 0.95 1.0 1.0  Sodium 135 - 145 mmol/L 143 141 141  Potassium 3.5 - 5.1 mmol/L 4.6 3.9 4.5  Chloride 98 - 111 mmol/L 108 - -  CO2 22 - 32 mmol/L 25 24 28   Calcium 8.9 - 10.3 mg/dL 9.4 8.9 10.1  Total Protein 6.5 - 8.1 g/dL 6.5 6.3(L) 6.4  Total Bilirubin 0.3 - 1.2 mg/dL 0.8 0.61 0.53  Alkaline Phos 38 - 126 U/L 54 54 56  AST 15 - 41 U/L 23 28 26   ALT 0 - 44 U/L 11 16 15     Lab Results  Component Value Date   WBC 32.0 (H) 05/23/2018   HGB 12.4 05/23/2018   HCT 39.3 05/23/2018   MCV 98.5 05/23/2018   PLT 112 (L) 05/23/2018   NEUTROABS 2.2 05/23/2018    ASSESSMENT & PLAN:  Lymphoma, 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  Weight loss of 20 pounds 2.  Slowly progressing lymphadenopathy in the neck 3.  Significant increase in the lymphocyte count  Based on these findings I suspect that the patient might require treatment in the near future.  I would like to request Dr.Kale to see her and suggest treatment options versus continued surveillance.   Breast cancer of upper-outer quadrant of left female breast 09/16/2010:Left breast 1.5 cm invasive ductal carcinoma with calcifications, grade 1, perineural invasion was seen, DCIS, margins negative, 2 sentinel nodes negative, ER 99%, PR negative, Ki-67 79%, HER-2 negative, Oncotype 24, 16% ROR Current treatment:  Anastrozole 1 mg daily started 10/25/2010 Breast cancer surveillance: 12/31/2016: Benign breast density category B. Patient needs another mammogram.  Patient will follow with Dr.Kale if he agrees.  I am happy to see her on as-needed basis.  No orders of the defined types were placed in this encounter.  The patient has a good understanding of the overall plan. she agrees with it. she will call with any problems that may develop before the next visit here.   Harriette Ohara, MD 05/23/18

## 2018-05-23 NOTE — Assessment & Plan Note (Signed)
09/16/2010:Left breast 1.5 cm invasive ductal carcinoma with calcifications, grade 1, perineural invasion was seen, DCIS, margins negative, 2 sentinel nodes negative, ER 99%, PR negative, Ki-67 79%, HER-2 negative, Oncotype 24, 16% ROR Current treatment: Anastrozole 1 mg daily started 10/25/2010 Breast cancer surveillance: 12/31/2016: Benign breast density category B. Patient needs another mammogram.

## 2018-05-25 ENCOUNTER — Telehealth: Payer: Self-pay | Admitting: Gastroenterology

## 2018-05-25 NOTE — Telephone Encounter (Signed)
Pt called inquiring about path results. °

## 2018-05-26 ENCOUNTER — Encounter: Payer: Self-pay | Admitting: Hematology

## 2018-05-26 ENCOUNTER — Telehealth: Payer: Self-pay | Admitting: Hematology

## 2018-05-26 NOTE — Telephone Encounter (Signed)
A new patient appointment has been scheduled for the pt to see Dr. Irene Limbo on 3/25 at 11am. Lft the appt date and time on the pt's vm. Letter mailed.

## 2018-05-26 NOTE — Telephone Encounter (Signed)
Patient updated that we will call with results as soon as Dr. Tarri Glenn has the opportunity to review.

## 2018-05-30 ENCOUNTER — Other Ambulatory Visit: Payer: Self-pay

## 2018-05-31 ENCOUNTER — Other Ambulatory Visit: Payer: Self-pay

## 2018-05-31 MED ORDER — METRONIDAZOLE 500 MG PO TABS: 500.0000 mg | ORAL_TABLET | Freq: Two times a day (BID) | ORAL | 0 refills | Status: DC

## 2018-05-31 MED ORDER — CLARITHROMYCIN 500 MG PO TABS: 500.0000 mg | ORAL_TABLET | Freq: Two times a day (BID) | ORAL | 0 refills | Status: DC

## 2018-06-02 ENCOUNTER — Telehealth: Payer: Self-pay | Admitting: Hematology

## 2018-06-02 NOTE — Telephone Encounter (Signed)
Pt cld to cancel appt with Dr. Irene Limbo for 3/25. Pt will cb to reschedule.

## 2018-06-08 ENCOUNTER — Ambulatory Visit: Payer: Self-pay | Admitting: Hematology

## 2018-06-24 ENCOUNTER — Telehealth: Payer: Self-pay | Admitting: Hematology

## 2018-06-24 NOTE — Progress Notes (Signed)
HEMATOLOGY/ONCOLOGY CONSULTATION NOTE  Date of Service: 06/27/2018  Patient Care Team: [name], MD as PCP - General (Internal Medicine)  CHIEF COMPLAINTS/PURPOSE OF CONSULTATION:  Chronic Lymphocytic Leukemia  Oncologic History:    Breast cancer of upper-outer quadrant of left female breast ([place])   09/16/2010 Surgery    Left breast 1.5 cm invasive ductal carcinoma with calcifications, grade 1, perineural invasion was seen, DCIS, margins negative, 2 sentinel nodes negative, ER 99%, PR negative, Ki-67 79%, HER-2 negative, Oncotype 24, 16% ROR    10/25/2010 -  Anti-estrogen oral therapy    Arimidex 1 mg daily developed side effects and was held briefly and restarted     Lymphoma, small lymphocytic ([place])   11/16/2014 Initial Diagnosis    Lymphoma, small lymphocytic: Based on cervical lymph node biopsy CD5 positive CD10 negative     HISTORY OF PRESENTING ILLNESS:   Brianna York is a wonderful 84 y.o. female who has been referred to [place] by my colleague Dr. Nicholas Lose for evaluation and management of Chronic Lymphocytic Leukemia. The pt reports that she is doing well overall.  The pt was initially diagnosed with SLL/CLL in 2016 after a cervical lymph node biopsy. The pt reports that she "wears out easy, a little tired, otherwise active." She notes that she takes a nap in the afternoon but notes this could be attributable to her age. She denies her fatigue interrupting her daily life. The pt notes that she has lost about 10 pounds over the last 4 years. She notes that her appetite "has probably changed some," and notes that she eats less, noting she "fills up faster." The pt denies any fevers, chills, or night sweats. She continues to enjoy eating and is not food averse.   The pt notes that she senses some lumps under her neck and on either side of her neck, which have grown gradually and do not bother her. She denies SOB, abdominal fullness or pains, and denies leg swelling.  However, she has recently been taking antibiotics for H.pylori. She denies skin rashes.  She notes that she is unsure if she is continuing to take Anastrozole, which she began in 2012. She notes that she takes care of herself in her home. Her brother lives down the street and checks in on her every now and then. She lives in her own home at this time and endorses being able to function well.  Most recent lab results (05/23/18) of CBC w/diff and CMP is as follows: all values are WNL except for WBC at 32k, PLT at 112k, Lymphs abs at 27.4k, Monocytes abs at 2.1k, GFR at 53. 05/23/18 LDH at 444  On review of systems, pt reports gradual neck lumps, functioning well, mostly stable energy levels with some occasional tiredness, chronic back pain, and denies fevers, chills, night sweats, new fatigue, unexpected weight loss, armpit or groin swelling, CP, changes in breathing, SOB, abdominal pains, abdominal fullness, upper abdominal pains, leg swelling, calf swelling, skin rashes, and any other symptoms.  On PMHx the pt reports breast cancer diagnosed in 2012, CLL/SLL. On Family Hx the pt reports Father died of acute leukemia.   MEDICAL HISTORY:  Past Medical History:  Diagnosis Date  . Adenomatous colon polyp    tubular  . Arthritis    osteo arthritis  . Breast cancer ([place]) 02/16/2011  . Diverticulosis   . GERD (gastroesophageal reflux disease)   . Hyperlipidemia   . Leukemia ([place])   . Lymphoma ([place])  small lympocytic  . Osteopenia     SURGICAL HISTORY: Past Surgical History:  Procedure Laterality Date  . bartholian cyst  1986  . BREAST LUMPECTOMY Left 09-16-2010  . CATARACT EXTRACTION, BILATERAL    . [place]  . KNEE ARTHROSCOPY Right 2010  . LYMPH GLAND EXCISION Right 11/16/2014   Procedure: EXCISION AND BIOPSY OF RIGHT CERVICAL LYMPH NODE;  Surgeon: Jackolyn Confer, MD;  Location: [facility];  Service: General;  Laterality: Right;  . TONSILLECTOMY   1940    SOCIAL HISTORY: Social History   Socioeconomic History  . Marital status: Widowed    Spouse name: Not on file  . Number of children: 5  . Years of education: Not on file  . Highest education level: Not on file  Occupational History  . Occupation: retired  Scientific laboratory technician  . Financial resource strain: Not on file  . Food insecurity:    Worry: Not on file    Inability: Not on file  . Transportation needs:    Medical: Not on file    Non-medical: Not on file  Tobacco Use  . Smoking status: Former Smoker    Types: Cigarettes    Last attempt to quit: 1989    Years since quitting: 31.3  . Smokeless tobacco: Never Used  . Tobacco comment: Quit smoking 27 years ago  Substance and Sexual Activity  . Alcohol use: Yes    Comment: occasional glass wine  . Drug use: No  . Sexual activity: Not Currently  Lifestyle  . Physical activity:    Days per week: Not on file    Minutes per session: Not on file  . Stress: Not on file  Relationships  . Social connections:    Talks on phone: Not on file    Gets together: Not on file    Attends religious service: Not on file    Active member of club or organization: Not on file    Attends meetings of clubs or organizations: Not on file    Relationship status: Not on file  . Intimate partner violence:    Fear of current or ex partner: Not on file    Emotionally abused: Not on file    Physically abused: Not on file    Forced sexual activity: Not on file  Other Topics Concern  . Not on file  Social History Narrative  . Not on file    FAMILY HISTORY: Family History  Problem Relation Age of Onset  . Colon cancer Mother 61  . Colon cancer Maternal Aunt        had colon sugery  . Leukemia Father   . Diabetes Brother   . Diabetes Brother     ALLERGIES:  is allergic to ciprofloxacin; loratadine-pseudoephedrine er; penicillins; and tolterodine tartrate.  MEDICATIONS:  Current Outpatient Medications  Medication Sig Dispense  Refill  . alendronate (FOSAMAX) 70 MG tablet Take 70 mg by mouth once a week. Take with a full glass of water on an empty stomach.    Marland Kitchen atorvastatin (LIPITOR) 10 MG tablet Take 10 mg by mouth daily.    . clarithromycin (BIAXIN) 500 MG tablet Take 1 tablet (500 mg total) by mouth 2 (two) times daily. 20 tablet 0  . metroNIDAZOLE (FLAGYL) 500 MG tablet Take 1 tablet (500 mg total) by mouth 2 (two) times daily. 20 tablet 0  . omeprazole (PRILOSEC) 40 MG capsule Take 1 capsule (40 mg total) by mouth 2 (two) times  daily. Omeprazole 40 mg twice daily for 8 weeks. 120 capsule 0  . Vitamin D, Ergocalciferol, (DRISDOL) 50000 UNITS CAPS capsule Take 50,000 Units by mouth every 7 (seven) days.     No current facility-administered medications for this visit.     REVIEW OF SYSTEMS:    10 Point review of Systems was done is negative except as noted above.  PHYSICAL EXAMINATION: ECOG PERFORMANCE STATUS: 2 - Symptomatic, <50% confined to bed  . Vitals:   06/27/18 1025  BP: 131/60  Pulse: 79  Resp: 18  Temp: 98.1 F (36.7 C)  SpO2: 98%   Filed Weights   06/27/18 1025  Weight: 126 lb 12.8 oz (57.5 kg)   .Body mass index is 27.44 kg/m.  GENERAL:alert, in no acute distress and comfortable SKIN: no acute rashes, no significant lesions EYES: conjunctiva are pink and non-injected, sclera anicteric OROPHARYNX: MMM, no exudates, no oropharyngeal erythema or ulceration NECK: supple, no JVD LYMPH: Several palpable LNs bilateral cervical, 1cm just palpable left inguinal lymph node, no palpable lymphadenopathy in the axillary region LUNGS: clear to auscultation b/l with normal respiratory effort HEART: regular rate & rhythm ABDOMEN:  normoactive bowel sounds , non tender, not distended. No palpable hepatosplenomegaly Extremity: no pedal edema PSYCH: alert & oriented x 3 with fluent speech NEURO: no focal motor/sensory deficits  LABORATORY DATA:  I have reviewed the data as listed  . CBC Latest  Ref Rng & Units 05/23/2018 11/22/2017 02/16/2017  WBC 4.0 - 10.5 K/uL 32.0(H) 21.0(H) 12.2(H)  Hemoglobin 12.0 - 15.0 g/dL 12.4 12.9 13.4  Hematocrit 36.0 - 46.0 % 39.3 40.1 41.0  Platelets 150 - 400 K/uL 112(L) 104(L) 111(L)    . CMP Latest Ref Rng & Units 05/23/2018 11/22/2017 02/16/2017  Glucose 70 - 99 mg/dL 80 98 108  BUN 8 - 23 mg/dL 11 17 22.7  Creatinine 0.44 - 1.00 mg/dL 0.99 0.95 1.0  Sodium 135 - 145 mmol/L 143 143 141  Potassium 3.5 - 5.1 mmol/L 4.3 4.6 3.9  Chloride 98 - 111 mmol/L 108 108 -  CO2 22 - 32 mmol/L 23 25 24   Calcium 8.9 - 10.3 mg/dL 9.2 9.4 8.9  Total Protein 6.5 - 8.1 g/dL 6.5 6.5 6.3(L)  Total Bilirubin 0.3 - 1.2 mg/dL 0.5 0.8 0.61  Alkaline Phos 38 - 126 U/L 58 54 54  AST 15 - 41 U/L 25 23 28   ALT 0 - 44 U/L 10 11 16    . Lab Results  Component Value Date   LDH 468 (H) 06/27/2018     02/21/18 Left Lacrimal sac biopsy:    12/18/14 Cytogenetics:    12/04/14 Right cervical lymph node biopsy:    RADIOGRAPHIC STUDIES: I have personally reviewed the radiological images as listed and agreed with the findings in the report. No results found.  ASSESSMENT & PLAN:  84 y.o. female with  1. Chronic Lymphocytic Leukemia 11/16/14 Right cervical lymph node biopsy revealed SLL/CLL 12/18/14 Cytogenetics revealed 59% of cells with gain of CEP12 and 5% of cells with loss of ATM 02/21/18 Right lacrimal gland revealed lymphoid infiltrates  2. History of Invasive Ductal Carcinoma 09/16/10 Left breast 1.5cm IDC with calcifications, grade 1, perineural invasion. DCIS margins negative, 2 sentinel nodes negative, ER 99%, PR negative, Ki-67 at 79%, HER-2 negative, Oncotype 24, 16% ROR Continues on 24m Anastrozole daily, began on 10/25/10. Last available Mammogram on 12/31/16 negative  PLAN:  -Discussed patient's most recent labs from 05/23/18, WBC at 32k increased from 21k 6 months  ago. PLT at 112k and stable over last year. Lymphs abs at 27.4k. Monocytes abs at 2.1k.  Chemistries normal. LDH elevated at 244. -Discussed that the patient's WBC appear to have been gradually increasing over the last few years, which have not caused anemia. Slight thrombocytopenia without concern for excess bleeding. Pt lacks constitutional symptoms. Some mild weight loss over 4 years with slight decrease in appetite. Reported gradual cervical lymph node progression without symptoms. 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 -LDH elevation due to increased CLL progression vs low level hemolysis ? -Discussed the indications to consider initiating treatment including cytopenias, significant splenomegaly, organ threat of injury, and constitutional symptoms. Do not recommend initiating treatment at this time in light of these criteria, at this time, which the pt agrees with and prefers. -Recommend meal supplements to mitigate slow, mild weight loss.  -Would like to repeat PET/CT in light of LDH elevation and to evaluate for character of lymph node progression since last imaging in 2016 -Will order blood tests today and will repeat cytogenetics to ensure these haven't changed in the last 4 years -Will see the pt back in 6 weeks   Labs today PET/CT in 5 weeks RTC with Dr Irene Limbo with labs in 6 weeks   All of the patients questions were answered with apparent satisfaction. The patient knows to call the clinic with any problems, questions or concerns.  The total time spent in the appt was 45 minutes and more than 50% was on counseling and direct patient cares.    Sullivan Lone MD MS AAHIVMS [facility] Hematology/Oncology Physician [facility]  (Office):       [phone] (Work cell):  [phone] (Fax):           [phone]  06/27/2018 11:04 AM  I, Baldwin [place], am acting as a scribe for Dr. Sullivan Lone.   .I have reviewed the above documentation for accuracy and completeness, and I agree with the above. [name] MD

## 2018-06-24 NOTE — Telephone Encounter (Signed)
Ptcld to reschedule her appt to see Dr. Irene Limbo on 4/13 at 10am.

## 2018-06-27 ENCOUNTER — Inpatient Hospital Stay: Payer: Medicare Other | Attending: Hematology and Oncology | Admitting: Hematology

## 2018-06-27 ENCOUNTER — Other Ambulatory Visit: Payer: Self-pay

## 2018-06-27 ENCOUNTER — Telehealth: Payer: Self-pay | Admitting: Hematology

## 2018-06-27 ENCOUNTER — Inpatient Hospital Stay: Payer: Medicare Other

## 2018-06-27 VITALS — BP 131/60 | HR 79 | Temp 98.1°F | Resp 18 | Ht <= 58 in | Wt 126.8 lb

## 2018-06-27 DIAGNOSIS — K219 Gastro-esophageal reflux disease without esophagitis: Secondary | ICD-10-CM | POA: Diagnosis not present

## 2018-06-27 DIAGNOSIS — Z170 Estrogen receptor positive status [ER+]: Secondary | ICD-10-CM

## 2018-06-27 DIAGNOSIS — C50412 Malignant neoplasm of upper-outer quadrant of left female breast: Secondary | ICD-10-CM | POA: Insufficient documentation

## 2018-06-27 DIAGNOSIS — M8580 Other specified disorders of bone density and structure, unspecified site: Secondary | ICD-10-CM | POA: Diagnosis not present

## 2018-06-27 DIAGNOSIS — M1990 Unspecified osteoarthritis, unspecified site: Secondary | ICD-10-CM | POA: Insufficient documentation

## 2018-06-27 DIAGNOSIS — C9110 Chronic lymphocytic leukemia of B-cell type not having achieved remission: Secondary | ICD-10-CM | POA: Insufficient documentation

## 2018-06-27 DIAGNOSIS — Z79899 Other long term (current) drug therapy: Secondary | ICD-10-CM | POA: Insufficient documentation

## 2018-06-27 DIAGNOSIS — E785 Hyperlipidemia, unspecified: Secondary | ICD-10-CM | POA: Insufficient documentation

## 2018-06-27 DIAGNOSIS — Z87891 Personal history of nicotine dependence: Secondary | ICD-10-CM | POA: Diagnosis not present

## 2018-06-27 DIAGNOSIS — Z79811 Long term (current) use of aromatase inhibitors: Secondary | ICD-10-CM | POA: Diagnosis not present

## 2018-06-27 LAB — CBC WITH DIFFERENTIAL/PLATELET
Abs Immature Granulocytes: 0.04 K/uL (ref 0.00–0.07)
Basophils Absolute: 0 K/uL (ref 0.0–0.1)
Basophils Relative: 0 %
Eosinophils Absolute: 0.3 K/uL (ref 0.0–0.5)
Eosinophils Relative: 1 %
HCT: 39.8 % (ref 36.0–46.0)
Hemoglobin: 12.8 g/dL (ref 12.0–15.0)
Immature Granulocytes: 0 %
Lymphocytes Relative: 84 %
Lymphs Abs: 27 K/uL — ABNORMAL HIGH (ref 0.7–4.0)
MCH: 31.1 pg (ref 26.0–34.0)
MCHC: 32.2 g/dL (ref 30.0–36.0)
MCV: 96.8 fL (ref 80.0–100.0)
Monocytes Absolute: 2.6 K/uL — ABNORMAL HIGH (ref 0.1–1.0)
Monocytes Relative: 8 %
Neutro Abs: 2.3 K/uL (ref 1.7–7.7)
Neutrophils Relative %: 7 %
Platelets: 102 K/uL — ABNORMAL LOW (ref 150–400)
RBC: 4.11 MIL/uL (ref 3.87–5.11)
RDW: 13.6 % (ref 11.5–15.5)
WBC: 32.3 K/uL — ABNORMAL HIGH (ref 4.0–10.5)
nRBC: 0 % (ref 0.0–0.2)

## 2018-06-27 LAB — CMP (CANCER CENTER ONLY)
ALT: 12 U/L (ref 0–44)
AST: 32 U/L (ref 15–41)
Albumin: 4.2 g/dL (ref 3.5–5.0)
Alkaline Phosphatase: 62 U/L (ref 38–126)
Anion gap: 10 (ref 5–15)
BUN: 11 mg/dL (ref 8–23)
CO2: 25 mmol/L (ref 22–32)
Calcium: 9.4 mg/dL (ref 8.9–10.3)
Chloride: 106 mmol/L (ref 98–111)
Creatinine: 1.05 mg/dL — ABNORMAL HIGH (ref 0.44–1.00)
GFR, Est AFR Am: 57 mL/min — ABNORMAL LOW
GFR, Estimated: 49 mL/min — ABNORMAL LOW
Glucose, Bld: 88 mg/dL (ref 70–99)
Potassium: 4.6 mmol/L (ref 3.5–5.1)
Sodium: 141 mmol/L (ref 135–145)
Total Bilirubin: 0.5 mg/dL (ref 0.3–1.2)
Total Protein: 6.7 g/dL (ref 6.5–8.1)

## 2018-06-27 LAB — SAMPLE TO BLOOD BANK

## 2018-06-27 LAB — LACTATE DEHYDROGENASE: LDH: 468 U/L — ABNORMAL HIGH (ref 98–192)

## 2018-06-27 NOTE — Telephone Encounter (Signed)
Scheduled appt per 4/13 los. ° °Central radiology will contact patient about scan. °

## 2018-06-29 LAB — LACTATE DEHYDROGENASE, ISOENZYMES
LDH 1: 19 % (ref 17–32)
LDH 2: 40 % (ref 25–40)
LDH 3: 27 % (ref 17–27)
LDH 4: 9 % (ref 5–13)
LDH 5: 5 % (ref 4–20)
LDH Isoenzymes, Total: 457 IU/L — ABNORMAL HIGH (ref 119–226)

## 2018-07-08 LAB — FISH,CLL PROGNOSTIC PANEL

## 2018-08-02 ENCOUNTER — Encounter (HOSPITAL_COMMUNITY)
Admission: RE | Admit: 2018-08-02 | Discharge: 2018-08-02 | Disposition: A | Discharge status: Home / Self Care | Payer: Medicare Other | Source: Ambulatory Visit | Attending: Hematology | Admitting: Hematology

## 2018-08-02 ENCOUNTER — Other Ambulatory Visit: Payer: Self-pay

## 2018-08-02 DIAGNOSIS — C9110 Chronic lymphocytic leukemia of B-cell type not having achieved remission: Secondary | ICD-10-CM | POA: Insufficient documentation

## 2018-08-02 DIAGNOSIS — Z79899 Other long term (current) drug therapy: Secondary | ICD-10-CM | POA: Insufficient documentation

## 2018-08-02 DIAGNOSIS — C9111 Chronic lymphocytic leukemia of B-cell type in remission: Secondary | ICD-10-CM | POA: Diagnosis not present

## 2018-08-02 DIAGNOSIS — R161 Splenomegaly, not elsewhere classified: Secondary | ICD-10-CM | POA: Insufficient documentation

## 2018-08-02 LAB — GLUCOSE, CAPILLARY: Glucose-Capillary: 85 mg/dL (ref 70–99)

## 2018-08-02 MED ORDER — FLUDEOXYGLUCOSE F - 18 (FDG) INJECTION
6.1200 | Freq: Once | INTRAVENOUS | Status: AC
  Administered 2018-08-02: 6.12 via INTRAVENOUS

## 2018-08-10 NOTE — Progress Notes (Signed)
HEMATOLOGY/ONCOLOGY CONSULTATION NOTE  Date of Service: 08/11/2018  Patient Care Team: Brianna Infante, MD as PCP - General (Internal Medicine)  CHIEF COMPLAINTS/PURPOSE OF CONSULTATION:  Chronic Lymphocytic Leukemia  Oncologic History:    Breast cancer of upper-outer quadrant of left female breast ([place])   09/16/2010 Surgery    Left breast 1.5 cm invasive ductal carcinoma with calcifications, grade 1, perineural invasion was seen, DCIS, margins negative, 2 sentinel nodes negative, ER 99%, PR negative, Ki-67 79%, HER-2 negative, Oncotype 24, 16% ROR    10/25/2010 -  Anti-estrogen oral therapy    Arimidex 1 mg daily developed side effects and was held briefly and restarted     Lymphoma, small lymphocytic ([place])   11/16/2014 Initial Diagnosis    Lymphoma, small lymphocytic: Based on cervical lymph node biopsy CD5 positive CD10 negative     HISTORY OF PRESENTING ILLNESS:   Brianna York is a wonderful 84 y.o. female who has been referred to [place] by my colleague Dr. Nicholas York for evaluation and management of Chronic Lymphocytic Leukemia. The pt reports that she is doing well overall.  The pt was initially diagnosed with SLL/CLL in 2016 after a cervical lymph node biopsy. The pt reports that she "wears out easy, a little tired, otherwise active." She notes that she takes a nap in the afternoon but notes this could be attributable to her age. She denies her fatigue interrupting her daily life. The pt notes that she has lost about 10 pounds over the last 4 years. She notes that her appetite "has probably changed some," and notes that she eats less, noting she "fills up faster." The pt denies any fevers, chills, or night sweats. She continues to enjoy eating and is not food averse.   The pt notes that she senses some lumps under her neck and on either side of her neck, which have grown gradually and do not bother her. She denies SOB, abdominal fullness or pains, and denies leg swelling.  However, she has recently been taking antibiotics for H.pylori. She denies skin rashes.  She notes that she is unsure if she is continuing to take Anastrozole, which she began in 2012. She notes that she takes care of herself in her home. Her brother lives down the street and checks in on her every now and then. She lives in her own home at this time and endorses being able to function well.  Most recent lab results (05/23/18) of CBC w/diff and CMP is as follows: all values are WNL except for WBC at 32k, PLT at 112k, Lymphs abs at 27.4k, Monocytes abs at 2.1k, GFR at 53. 05/23/18 LDH at 444  On review of systems, pt reports gradual neck lumps, functioning well, mostly stable energy levels with some occasional tiredness, chronic back pain, and denies fevers, chills, night sweats, new fatigue, unexpected weight loss, armpit or groin swelling, CP, changes in breathing, SOB, abdominal pains, abdominal fullness, upper abdominal pains, leg swelling, calf swelling, skin rashes, and any other symptoms.  On PMHx the pt reports breast cancer diagnosed in 2012, CLL/SLL. On Family Hx the pt reports Father died of acute leukemia.  Interval History:   Brianna York returns today for management and evaluation of her CLL. The patient's last visit with [place] was on 06/27/18. The pt reports that she is doing well overall. She is accompanied today by her son Brianna York, who is her POA, via Social worker.  The pt reports that she feels fairly tired, which  is different than as compared to 12 months ago, possibly different than 6 months ago, but no different as compared to our last visit. She notes that she previously had enough energy to do more work outside around her house, which she lacks now. She denies debilitating fatigue. The pt denies any fevers, chills, night sweats, or unexpected weight loss. She notes that she "is pretty lumpy," with enlarged cervical lymph nodes which she denies having changed in size or character since  our last visit.  She denies abnormal bruising or excessive bleeding.  The pt notes that she has an H.pylori infection. She notes that she is currently taking Amoxicillin BID for 2 weeks. She notes that she took this for a few days, but began feeling sick and "switched to wine instead."  Of note since the patient's last visit, pt has had a PET/CT completed on 08/03/18 with results revealing "Exam positive for FDG avid adenopathy within the neck, chest, abdomen and pelvis compatible with the clinical history of chronic lymphocytic leukemia. 2. Splenomegaly with mild increased radiotracer uptake throughout the spleen (above blood pool activity".  Lab results today (08/11/18) of CBC w/diff and CMP is as follows: all values are WNL except for WBC at 25.6k, PLT at 82k, Lymphs abs at 21.7k, Monocytes abs at 1.6k, Creatinine at 1.02, Total Protein at 6.3, GFR at 51. 08/11/18 LDH at 412  On review of systems, pt reports somewhat low energy levels, stable enlarged neck lymph nodes, and denies abnormal bruising, concern for bleeding, fevers, chills, night sweats, unexpected weight loss, abdominal pains, leg swelling, and any other symptoms.   MEDICAL HISTORY:  Past Medical History:  Diagnosis Date  . Adenomatous colon polyp    tubular  . Arthritis    osteo arthritis  . Breast cancer ([place]) 02/16/2011  . Diverticulosis   . GERD (gastroesophageal reflux disease)   . Hyperlipidemia   . Leukemia ([place])   . Lymphoma ([place])    small lympocytic  . Osteopenia     SURGICAL HISTORY: Past Surgical History:  Procedure Laterality Date  . bartholian cyst  1986  . BREAST LUMPECTOMY Left 09-16-2010  . CATARACT EXTRACTION, BILATERAL    . [place]  . KNEE ARTHROSCOPY Right 2010  . LYMPH GLAND EXCISION Right 11/16/2014   Procedure: EXCISION AND BIOPSY OF RIGHT CERVICAL LYMPH NODE;  Surgeon: Jackolyn Confer, MD;  Location: [facility];  Service: General;  Laterality: Right;  .  TONSILLECTOMY  1940    SOCIAL HISTORY: Social History   Socioeconomic History  . Marital status: Widowed    Spouse name: Not on file  . Number of children: 5  . Years of education: Not on file  . Highest education level: Not on file  Occupational History  . Occupation: retired  Scientific laboratory technician  . Financial resource strain: Not on file  . Food insecurity:    Worry: Not on file    Inability: Not on file  . Transportation needs:    Medical: Not on file    Non-medical: Not on file  Tobacco Use  . Smoking status: Former Smoker    Types: Cigarettes    Last attempt to quit: 1989    Years since quitting: 31.4  . Smokeless tobacco: Never Used  . Tobacco comment: Quit smoking 27 years ago  Substance and Sexual Activity  . Alcohol use: Yes    Comment: occasional glass wine  . Drug use: No  . Sexual activity: Not Currently  Lifestyle  . Physical activity:    Days per week: Not on file    Minutes per session: Not on file  . Stress: Not on file  Relationships  . Social connections:    Talks on phone: Not on file    Gets together: Not on file    Attends religious service: Not on file    Active member of club or organization: Not on file    Attends meetings of clubs or organizations: Not on file    Relationship status: Not on file  . Intimate partner violence:    Fear of current or ex partner: Not on file    Emotionally abused: Not on file    Physically abused: Not on file    Forced sexual activity: Not on file  Other Topics Concern  . Not on file  Social History Narrative  . Not on file    FAMILY HISTORY: Family History  Problem Relation Age of Onset  . Colon cancer Mother 51  . Colon cancer Maternal Aunt        had colon sugery  . Leukemia Father   . Diabetes Brother   . Diabetes Brother     ALLERGIES:  is allergic to ciprofloxacin; loratadine-pseudoephedrine er; penicillins; and tolterodine tartrate.  MEDICATIONS:  Current Outpatient Medications  Medication  Sig Dispense Refill  . alendronate (FOSAMAX) 70 MG tablet Take 70 mg by mouth once a week. Take with a full glass of water on an empty stomach.    Marland Kitchen atorvastatin (LIPITOR) 10 MG tablet Take 10 mg by mouth daily.    . clarithromycin (BIAXIN) 500 MG tablet Take 1 tablet (500 mg total) by mouth 2 (two) times daily. 20 tablet 0  . metroNIDAZOLE (FLAGYL) 500 MG tablet Take 1 tablet (500 mg total) by mouth 2 (two) times daily. 20 tablet 0  . omeprazole (PRILOSEC) 40 MG capsule Take 1 capsule (40 mg total) by mouth 2 (two) times daily. Omeprazole 40 mg twice daily for 8 weeks. 120 capsule 0  . Vitamin D, Ergocalciferol, (DRISDOL) 50000 UNITS CAPS capsule Take 50,000 Units by mouth every 7 (seven) days.     No current facility-administered medications for this visit.     REVIEW OF SYSTEMS:    A 10+ POINT REVIEW OF SYSTEMS WAS OBTAINED including neurology, dermatology, psychiatry, cardiac, respiratory, lymph, extremities, GI, GU, Musculoskeletal, constitutional, breasts, reproductive, HEENT.  All pertinent positives are noted in the HPI.  All others are negative.   PHYSICAL EXAMINATION: ECOG PERFORMANCE STATUS: 2 - Symptomatic, <50% confined to bed  . Vitals:   08/11/18 1105  BP: (!) 141/60  Pulse: 81  Resp: (!) 8  Temp: 97.8 F (36.6 C)  SpO2: 100%   Filed Weights   08/11/18 1105  Weight: 126 lb 8 oz (57.4 kg)   .Body mass index is 27.37 kg/m.  GENERAL:alert, in no acute distress and comfortable SKIN: no acute rashes, no significant lesions EYES: conjunctiva are pink and non-injected, sclera anicteric OROPHARYNX: MMM, no exudates, no oropharyngeal erythema or ulceration NECK: supple, no JVD LYMPH: Several palpable LNs bilateral cervical, 1cm just palpable left inguinal lymph node, no palpable lymphadenopathy in the axillary regions LUNGS: clear to auscultation b/l with normal respiratory effort HEART: regular rate & rhythm ABDOMEN:  normoactive bowel sounds , non tender, not  distended. No palpable hepatosplenomegaly.  Extremity: no pedal edema PSYCH: alert & oriented x 3 with fluent speech NEURO: no focal motor/sensory deficits   LABORATORY DATA:  I have reviewed  the data as listed  . CBC Latest Ref Rng & Units 08/11/2018 06/27/2018 05/23/2018  WBC 4.0 - 10.5 K/uL 25.6(H) 32.3(H) 32.0(H)  Hemoglobin 12.0 - 15.0 g/dL 12.3 12.8 12.4  Hematocrit 36.0 - 46.0 % 39.2 39.8 39.3  Platelets 150 - 400 K/uL 82(L) 102(L) 112(L)    . CMP Latest Ref Rng & Units 08/11/2018 06/27/2018 05/23/2018  Glucose 70 - 99 mg/dL 90 88 80  BUN 8 - 23 mg/dL '16 11 11  ' Creatinine 0.44 - 1.00 mg/dL 1.02(H) 1.05(H) 0.99  Sodium 135 - 145 mmol/L 140 141 143  Potassium 3.5 - 5.1 mmol/L 4.3 4.6 4.3  Chloride 98 - 111 mmol/L 107 106 108  CO2 22 - 32 mmol/L '25 25 23  ' Calcium 8.9 - 10.3 mg/dL 9.3 9.4 9.2  Total Protein 6.5 - 8.1 g/dL 6.3(L) 6.7 6.5  Total Bilirubin 0.3 - 1.2 mg/dL 0.5 0.5 0.5  Alkaline Phos 38 - 126 U/L 54 62 58  AST 15 - 41 U/L 25 32 25  ALT 0 - 44 U/L '8 12 10   ' . Lab Results  Component Value Date   LDH 412 (H) 08/11/2018     02/21/18 Left Lacrimal sac biopsy:    12/18/14 Cytogenetics:    12/04/14 Right cervical lymph node biopsy:   06/27/18 FISH CLL Prognostic Panel:    RADIOGRAPHIC STUDIES: I have personally reviewed the radiological images as listed and agreed with the findings in the report. Nm Pet Image Initial (pi) Skull Base To Thigh  Result Date: 08/03/2018 CLINICAL DATA:  Initial treatment strategy for chronic lymphocytic leukemia. EXAM: NUCLEAR MEDICINE PET SKULL BASE TO THIGH TECHNIQUE: 6.12 mCi F-18 FDG was injected intravenously. Full-ring PET imaging was performed from the skull base to thigh after the radiotracer. CT data was obtained and used for attenuation correction and anatomic localization. Fasting blood glucose: 85 mg/dl COMPARISON:  None. FINDINGS: Mediastinal blood pool activity: SUV max 2.95 Liver activity: SUV max 3.16 NECK: Extensive  bilateral FDG avid adenopathy identified. -index lymph node within the left parotid gland measures 1.5 cm and has an SUV max of 4.4. -index right level 2 lymph node measures 1.3 cm and has an SUV max of 4.21. -index left level 2 lymph node measures 1.5 cm and has an SUV max of 4.6. Incidental CT findings: none CHEST: Bilateral axillary, retropectoral, and supraclavicular lymph nodes identified. -index right axillary lymph node measures 1.4 cm and has an SUV max of 3.75. -Index left axillary lymph node measures 1.5 cm and has an SUV max of 4.75. -index right supraclavicular lymph node measures 1.3 cm and has an SUV max of 3.37. - low left paratracheal lymph node measures 1.4 cm and has an SUV max of 3.70. Incidental CT findings: Aortic atherosclerosis. Calcification in the coronary arteries. ABDOMEN/PELVIS: No abnormal radiotracer activity identified within the liver, pancreas, or adrenal glands. The spleen measures 12.1 by 6.5 by 13.1 cm (volume = 540 cm^3). Mild diffuse increased uptake within the spleen has an SUV max of 4.1. FDG avid adenopathy identified within the abdomen and pelvis. -portacaval node measures 1.8 cm within SUV max of 3.59. -aortocaval lymph node measures 1 cm within SUV max of 3.65. -left common iliac node measures 1.7 cm and has an SUV max of 3.96. -Within the right pelvic sidewall there is a 2 cm lymph node within SUV max of 3.88. Incidental CT findings: Aortic atherosclerosis.  No aneurysm. SKELETON: No focal hypermetabolic activity to suggest skeletal metastasis. Incidental CT findings: none  IMPRESSION: 1. Exam positive for FDG avid adenopathy within the neck, chest, abdomen and pelvis compatible with the clinical history of chronic lymphocytic leukemia. 2. Splenomegaly with mild increased radiotracer uptake throughout the spleen (above blood pool activity). Electronically Signed   By: Kerby Moors M.D.   On: 08/03/2018 09:17    ASSESSMENT & PLAN:  84 y.o. female with  1. Chronic  Lymphocytic Leukemia 11/16/14 Right cervical lymph node biopsy revealed SLL/CLL 12/18/14 Cytogenetics revealed 59% of cells with gain of CEP12 and 5% of cells with loss of ATM 02/21/18 Right lacrimal gland revealed lymphoid infiltrates  2. History of Invasive Ductal Carcinoma 09/16/10 Left breast 1.5cm IDC with calcifications, grade 1, perineural invasion. DCIS margins negative, 2 sentinel nodes negative, ER 99%, PR negative, Ki-67 at 79%, HER-2 negative, Oncotype 24, 16% ROR Continues on 3m Anastrozole daily, began on 10/25/10. Last available Mammogram on 12/31/16 negative  PLAN:  -Discussed pt labwork today, 08/11/18; WBC have decreased in the interim from 32.3k 1 month ago to 25.6k today with lymphocytes at 21.7k. HGB normal. PLT slightly lower at 82k. -08/11/18 LDH at 412 LDH elevation due to increased CLL progression vs low level hemolysis vs some element of BM scarring. Her LDH may not correlate with increased lymphoma activity. -Discussed the 08/03/18 PET/CT which revealed "Exam positive for FDG avid adenopathy within the neck, chest, abdomen and pelvis compatible with the clinical history of chronic lymphocytic leukemia. 2. Splenomegaly (about 13cm) with mild increased radiotracer uptake throughout the spleen (above blood pool activity)".  -Discussed the 06/27/18 FISH CLL Prognostic Panel which revealed 49.33% of cells with Trisomy 12, and only 2% of cells with 17p mutation -Discussed the criteria which indicates considerations of treatment including constitutional symptoms, cytopenias, and threat of organ injury -PLT lower at 82k a soft indication and will continue to monitor. Also noted in setting of H.pylori infection and current antibiotic and acid suppressant use, which confounds the picture. Will check labs again in 4-6 weeks. -Recommend that pt finish her antibiotic course for full H.pylori clearance, and check in with her PCP/GI if she cannot tolerate her prescribed antibiotic -Pt has  borderline splenomegaly and enlarged lymph nodes without bulkiness. Pt lacks constitutional symptoms. She does endorse some lower energy levels but cannot attribute this completely to CLL based on her description. -Recommend meal supplements to mitigate slow, mild weight loss.  -Will see the pt back in 6 weeks, sooner if any new concerns   RTC with labs with Dr KIrene Limboin 6 weeks   All of the patients questions were answered with apparent satisfaction. The patient knows to call the clinic with any problems, questions or concerns.  The total time spent in the appt was 25 minutes and more than 50% was on counseling and direct patient cares.    GSullivan LoneMD MS AAHIVMS SHoly Cross Germantown HospitalCStarr Regional Medical Center EtowahHematology/Oncology Physician CLake [facility] (Office):       [phone](Work cell):  [phone](Fax):           [phone] 08/11/2018 12:05 PM  I, SBaldwin Jamaica am acting as a scribe for Dr. GSullivan Lone   .I have reviewed the above documentation for accuracy and completeness, and I agree with the above. .Brunetta GeneraMD

## 2018-08-11 ENCOUNTER — Telehealth: Payer: Self-pay | Admitting: Hematology

## 2018-08-11 ENCOUNTER — Other Ambulatory Visit: Payer: Self-pay

## 2018-08-11 ENCOUNTER — Inpatient Hospital Stay: Payer: Medicare Other | Attending: Hematology and Oncology

## 2018-08-11 ENCOUNTER — Inpatient Hospital Stay (HOSPITAL_BASED_OUTPATIENT_CLINIC_OR_DEPARTMENT_OTHER): Payer: Medicare Other | Admitting: Hematology

## 2018-08-11 VITALS — BP 141/60 | HR 81 | Temp 97.8°F | Resp 8 | Ht <= 58 in | Wt 126.5 lb

## 2018-08-11 DIAGNOSIS — Z806 Family history of leukemia: Secondary | ICD-10-CM

## 2018-08-11 DIAGNOSIS — M1990 Unspecified osteoarthritis, unspecified site: Secondary | ICD-10-CM | POA: Diagnosis not present

## 2018-08-11 DIAGNOSIS — C50412 Malignant neoplasm of upper-outer quadrant of left female breast: Secondary | ICD-10-CM | POA: Diagnosis not present

## 2018-08-11 DIAGNOSIS — Z79811 Long term (current) use of aromatase inhibitors: Secondary | ICD-10-CM | POA: Diagnosis not present

## 2018-08-11 DIAGNOSIS — Z87891 Personal history of nicotine dependence: Secondary | ICD-10-CM

## 2018-08-11 DIAGNOSIS — D696 Thrombocytopenia, unspecified: Secondary | ICD-10-CM | POA: Insufficient documentation

## 2018-08-11 DIAGNOSIS — Z170 Estrogen receptor positive status [ER+]: Secondary | ICD-10-CM | POA: Diagnosis not present

## 2018-08-11 DIAGNOSIS — K219 Gastro-esophageal reflux disease without esophagitis: Secondary | ICD-10-CM | POA: Diagnosis not present

## 2018-08-11 DIAGNOSIS — Z79899 Other long term (current) drug therapy: Secondary | ICD-10-CM | POA: Insufficient documentation

## 2018-08-11 DIAGNOSIS — M8580 Other specified disorders of bone density and structure, unspecified site: Secondary | ICD-10-CM | POA: Diagnosis not present

## 2018-08-11 DIAGNOSIS — E785 Hyperlipidemia, unspecified: Secondary | ICD-10-CM

## 2018-08-11 DIAGNOSIS — R5383 Other fatigue: Secondary | ICD-10-CM

## 2018-08-11 DIAGNOSIS — C9110 Chronic lymphocytic leukemia of B-cell type not having achieved remission: Secondary | ICD-10-CM | POA: Insufficient documentation

## 2018-08-11 LAB — CMP (CANCER CENTER ONLY)
ALT: 8 U/L (ref 0–44)
AST: 25 U/L (ref 15–41)
Albumin: 4 g/dL (ref 3.5–5.0)
Alkaline Phosphatase: 54 U/L (ref 38–126)
Anion gap: 8 (ref 5–15)
BUN: 16 mg/dL (ref 8–23)
CO2: 25 mmol/L (ref 22–32)
Calcium: 9.3 mg/dL (ref 8.9–10.3)
Chloride: 107 mmol/L (ref 98–111)
Creatinine: 1.02 mg/dL — ABNORMAL HIGH (ref 0.44–1.00)
GFR, Est AFR Am: 59 mL/min — ABNORMAL LOW (ref >60)
GFR, Estimated: 51 mL/min — ABNORMAL LOW (ref >60)
Glucose, Bld: 90 mg/dL (ref 70–99)
Potassium: 4.3 mmol/L (ref 3.5–5.1)
Sodium: 140 mmol/L (ref 135–145)
Total Bilirubin: 0.5 mg/dL (ref 0.3–1.2)
Total Protein: 6.3 g/dL — ABNORMAL LOW (ref 6.5–8.1)

## 2018-08-11 LAB — CBC WITH DIFFERENTIAL/PLATELET
Abs Immature Granulocytes: 0.04 10*3/uL (ref 0.00–0.07)
Basophils Absolute: 0.1 10*3/uL (ref 0.0–0.1)
Basophils Relative: 0 %
Eosinophils Absolute: 0.2 10*3/uL (ref 0.0–0.5)
Eosinophils Relative: 1 %
HCT: 39.2 % (ref 36.0–46.0)
Hemoglobin: 12.3 g/dL (ref 12.0–15.0)
Immature Granulocytes: 0 %
Lymphocytes Relative: 85 %
Lymphs Abs: 21.7 10*3/uL — ABNORMAL HIGH (ref 0.7–4.0)
MCH: 31 pg (ref 26.0–34.0)
MCHC: 31.4 g/dL (ref 30.0–36.0)
MCV: 98.7 fL (ref 80.0–100.0)
Monocytes Absolute: 1.6 10*3/uL — ABNORMAL HIGH (ref 0.1–1.0)
Monocytes Relative: 6 %
Neutro Abs: 2 10*3/uL (ref 1.7–7.7)
Neutrophils Relative %: 8 %
Platelets: 82 10*3/uL — ABNORMAL LOW (ref 150–400)
RBC: 3.97 MIL/uL (ref 3.87–5.11)
RDW: 14 % (ref 11.5–15.5)
WBC: 25.6 10*3/uL — ABNORMAL HIGH (ref 4.0–10.5)
nRBC: 0 % (ref 0.0–0.2)

## 2018-08-11 LAB — LACTATE DEHYDROGENASE: LDH: 412 U/L — ABNORMAL HIGH (ref 98–192)

## 2018-08-11 NOTE — Telephone Encounter (Signed)
Scheduled appt per 5/28 los. °

## 2018-09-21 NOTE — Progress Notes (Signed)
HEMATOLOGY/ONCOLOGY CONSULTATION NOTE  Date of Service: 09/22/2018  Patient Care Team: [name], MD as PCP - General (Internal Medicine)  CHIEF COMPLAINTS/PURPOSE OF CONSULTATION:  Chronic Lymphocytic Leukemia  Oncologic History:  Oncology History  Breast cancer of upper-outer quadrant of left female breast ([place])  09/16/2010 Surgery   Left breast 1.5 cm invasive ductal carcinoma with calcifications, grade 1, perineural invasion was seen, DCIS, margins negative, 2 sentinel nodes negative, ER 99%, PR negative, Ki-67 79%, HER-2 negative, Oncotype 24, 16% ROR   10/25/2010 -  Anti-estrogen oral therapy   Arimidex 1 mg daily developed side effects and was held briefly and restarted   Lymphoma, small lymphocytic ([place])  11/16/2014 Initial Diagnosis   Lymphoma, small lymphocytic: Based on cervical lymph node biopsy CD5 positive CD10 negative     HISTORY OF PRESENTING ILLNESS:   Brianna York is a wonderful 84 y.o. female who has been referred to [place] by my colleague Dr. Nicholas Lose for evaluation and management of Chronic Lymphocytic Leukemia. The pt reports that she is doing well overall.  The pt was initially diagnosed with SLL/CLL in 2016 after a cervical lymph node biopsy. The pt reports that she "wears out easy, a little tired, otherwise active." She notes that she takes a nap in the afternoon but notes this could be attributable to her age. She denies her fatigue interrupting her daily life. The pt notes that she has lost about 10 pounds over the last 4 years. She notes that her appetite "has probably changed some," and notes that she eats less, noting she "fills up faster." The pt denies any fevers, chills, or night sweats. She continues to enjoy eating and is not food averse.   The pt notes that she senses some lumps under her neck and on either side of her neck, which have grown gradually and do not bother her. She denies SOB, abdominal fullness or pains, and denies leg swelling.  However, she has recently been taking antibiotics for H.pylori. She denies skin rashes.  She notes that she is unsure if she is continuing to take Anastrozole, which she began in 2012. She notes that she takes care of herself in her home. Her brother lives down the street and checks in on her every now and then. She lives in her own home at this time and endorses being able to function well.  Most recent lab results (05/23/18) of CBC w/diff and CMP is as follows: all values are WNL except for WBC at 32k, PLT at 112k, Lymphs abs at 27.4k, Monocytes abs at 2.1k, GFR at 53. 05/23/18 LDH at 444  On review of systems, pt reports gradual neck lumps, functioning well, mostly stable energy levels with some occasional tiredness, chronic back pain, and denies fevers, chills, night sweats, new fatigue, unexpected weight loss, armpit or groin swelling, CP, changes in breathing, SOB, abdominal pains, abdominal fullness, upper abdominal pains, leg swelling, calf swelling, skin rashes, and any other symptoms.  On PMHx the pt reports breast cancer diagnosed in 2012, CLL/SLL. On Family Hx the pt reports Father died of acute leukemia.  Interval History:   EMILIANNA York returns today for management and evaluation of her CLL. The patient's last visit with [place] was on 08/11/18. The pt reports that she is doing well overall.  The pt reports that she has continued to notice her enlarged lymph nodes in her neck, which she doesn't feel has changed much in the last 6 weeks and denies these "  bothering her." The pt denies any fevers, chills or night sweats. She notes that she has lost 20 pounds in the last few years. Her weight is stable as compared to 3 months ago. She feels that "food passes right through." The pt notes that she has not been taking her antibiotics regularly for her H.pylori infection because these bothered her stomach and "just makes her sick." She has not followed up with GI regarding this.  She notes that  her eyes have been watering frequently recently and is concerned for an infection and is using antibiotic eye drops. She is concerned that her tear ducts may be backed up. She sees Dr. Herbert Deaner in Ophthalmology.  Lab results today (09/22/18) of CBC w/diff, Reticulocytes, and CMP is as follows: all values are WNL except for WBC at 24.8k, PLT at 87k, ANC at 700, Lymphs abs at 23.3k, Immature Retic fract at 16.2%, Creatinine at 102, Calcium at 8.8, GFR at 51. 09/22/18 LDH at 452  On review of systems, pt reports stable neck lymph nodes, staying active, and denies fevers, chills, night sweats, leg swelling, and any other symptoms.    MEDICAL HISTORY:  Past Medical History:  Diagnosis Date  . Adenomatous colon polyp    tubular  . Arthritis    osteo arthritis  . Breast cancer ([place]) 02/16/2011  . Diverticulosis   . GERD (gastroesophageal reflux disease)   . Hyperlipidemia   . Leukemia ([place])   . Lymphoma ([place])    small lympocytic  . Osteopenia     SURGICAL HISTORY: Past Surgical History:  Procedure Laterality Date  . bartholian cyst  1986  . BREAST LUMPECTOMY Left 09-16-2010  . CATARACT EXTRACTION, BILATERAL    . [place]  . KNEE ARTHROSCOPY Right 2010  . LYMPH GLAND EXCISION Right 11/16/2014   Procedure: EXCISION AND BIOPSY OF RIGHT CERVICAL LYMPH NODE;  Surgeon: Jackolyn Confer, MD;  Location: [facility];  Service: General;  Laterality: Right;  . TONSILLECTOMY  1940    SOCIAL HISTORY: Social History   Socioeconomic History  . Marital status: Widowed    Spouse name: Not on file  . Number of children: 5  . Years of education: Not on file  . Highest education level: Not on file  Occupational History  . Occupation: retired  Scientific laboratory technician  . Financial resource strain: Not on file  . Food insecurity    Worry: Not on file    Inability: Not on file  . Transportation needs    Medical: Not on file    Non-medical: Not on file  Tobacco Use  .  Smoking status: Former Smoker    Types: Cigarettes    Quit date: 1989    Years since quitting: 31.5  . Smokeless tobacco: Never Used  . Tobacco comment: Quit smoking 27 years ago  Substance and Sexual Activity  . Alcohol use: Yes    Comment: occasional glass wine  . Drug use: No  . Sexual activity: Not Currently  Lifestyle  . Physical activity    Days per week: Not on file    Minutes per session: Not on file  . Stress: Not on file  Relationships  . Social Herbalist on phone: Not on file    Gets together: Not on file    Attends religious service: Not on file    Active member of club or organization: Not on file    Attends meetings of clubs or organizations:  Not on file    Relationship status: Not on file  . Intimate partner violence    Fear of current or ex partner: Not on file    Emotionally abused: Not on file    Physically abused: Not on file    Forced sexual activity: Not on file  Other Topics Concern  . Not on file  Social History Narrative  . Not on file    FAMILY HISTORY: Family History  Problem Relation Age of Onset  . Colon cancer Mother 3  . Colon cancer Maternal Aunt        had colon sugery  . Leukemia Father   . Diabetes Brother   . Diabetes Brother     ALLERGIES:  is allergic to ciprofloxacin; loratadine-pseudoephedrine er; penicillins; and tolterodine tartrate.  MEDICATIONS:  Current Outpatient Medications  Medication Sig Dispense Refill  . alendronate (FOSAMAX) 70 MG tablet Take 70 mg by mouth once a week. Take with a full glass of water on an empty stomach.    Marland Kitchen atorvastatin (LIPITOR) 10 MG tablet Take 10 mg by mouth daily.    . clarithromycin (BIAXIN) 500 MG tablet Take 1 tablet (500 mg total) by mouth 2 (two) times daily. 20 tablet 0  . metroNIDAZOLE (FLAGYL) 500 MG tablet Take 1 tablet (500 mg total) by mouth 2 (two) times daily. 20 tablet 0  . omeprazole (PRILOSEC) 40 MG capsule Take 1 capsule (40 mg total) by mouth 2 (two) times  daily. Omeprazole 40 mg twice daily for 8 weeks. 120 capsule 0  . Vitamin D, Ergocalciferol, (DRISDOL) 50000 UNITS CAPS capsule Take 50,000 Units by mouth every 7 (seven) days.     No current facility-administered medications for this visit.     REVIEW OF SYSTEMS:    A 10+ POINT REVIEW OF SYSTEMS WAS OBTAINED including neurology, dermatology, psychiatry, cardiac, respiratory, lymph, extremities, GI, GU, Musculoskeletal, constitutional, breasts, reproductive, HEENT.  All pertinent positives are noted in the HPI.  All others are negative.   PHYSICAL EXAMINATION: ECOG PERFORMANCE STATUS: 2 - Symptomatic, <50% confined to bed  . Vitals:   09/22/18 1224  BP: (!) 140/58  Pulse: 77  Resp: 18  Temp: 98.9 F (37.2 C)  SpO2: 100%   Filed Weights   09/22/18 1224  Weight: 125 lb 3.2 oz (56.8 kg)   .Body mass index is 27.09 kg/m.  GENERAL:alert, in no acute distress and comfortable SKIN: no acute rashes, no significant lesions EYES: conjunctiva are pink and non-injected, sclera anicteric OROPHARYNX: MMM, no exudates, no oropharyngeal erythema or ulceration NECK: supple, no JVD LYMPH: Several palpable LNs bilateral cervical, small palpable LN in left axillary, 1cm just palpable left inguinal lymph node LUNGS: clear to auscultation b/l with normal respiratory effort HEART: regular rate & rhythm ABDOMEN:  normoactive bowel sounds , non tender, not distended. No palpable hepatosplenomegaly.  Extremity: no pedal edema PSYCH: alert & oriented x 3 with fluent speech NEURO: no focal motor/sensory deficits   LABORATORY DATA:  I have reviewed the data as listed  . CBC Latest Ref Rng & Units 09/22/2018 08/11/2018 06/27/2018  WBC 4.0 - 10.5 K/uL 24.8(H) 25.6(H) 32.3(H)  Hemoglobin 12.0 - 15.0 g/dL 12.9 12.3 12.8  Hematocrit 36.0 - 46.0 % 39.7 39.2 39.8  Platelets 150 - 400 K/uL 87(L) 82(L) 102(L)    . CMP Latest Ref Rng & Units 09/22/2018 08/11/2018 06/27/2018  Glucose 70 - 99 mg/dL 84 90 88   BUN 8 - 23 mg/dL 12 16 11   Creatinine  0.44 - 1.00 mg/dL 1.02(H) 1.02(H) 1.05(H)  Sodium 135 - 145 mmol/L 142 140 141  Potassium 3.5 - 5.1 mmol/L 4.5 4.3 4.6  Chloride 98 - 111 mmol/L 108 107 106  CO2 22 - 32 mmol/L 26 25 25   Calcium 8.9 - 10.3 mg/dL 8.8(L) 9.3 9.4  Total Protein 6.5 - 8.1 g/dL 6.6 6.3(L) 6.7  Total Bilirubin 0.3 - 1.2 mg/dL 0.5 0.5 0.5  Alkaline Phos 38 - 126 U/L 56 54 62  AST 15 - 41 U/L 30 25 32  ALT 0 - 44 U/L 11 8 12    . Lab Results  Component Value Date   LDH 452 (H) 09/22/2018     02/21/18 Left Lacrimal sac biopsy:    12/18/14 Cytogenetics:    12/04/14 Right cervical lymph node biopsy:   06/27/18 FISH CLL Prognostic Panel:    RADIOGRAPHIC STUDIES: I have personally reviewed the radiological images as listed and agreed with the findings in the report. No results found.  ASSESSMENT & PLAN:  84 y.o. female with  1. Chronic Lymphocytic Leukemia 11/16/14 Right cervical lymph node biopsy revealed SLL/CLL 12/18/14 Cytogenetics revealed 59% of cells with gain of CEP12 and 5% of cells with loss of ATM 02/21/18 Right lacrimal gland revealed lymphoid infiltrates 06/27/18 FISH CLL Prognostic Panel revealed 49.33% of cells with Trisomy 12, and only 2% of cells with 17p mutation  08/03/18 PET/CT revealed "Exam positive for FDG avid adenopathy within the neck, chest, abdomen and pelvis compatible with the clinical history of chronic lymphocytic leukemia. 2. Splenomegaly (about 13cm) with mild increased radiotracer uptake throughout the spleen (above blood pool activity)".   2. History of Invasive Ductal Carcinoma 09/16/10 Left breast 1.5cm IDC with calcifications, grade 1, perineural invasion. DCIS margins negative, 2 sentinel nodes negative, ER 99%, PR negative, Ki-67 at 79%, HER-2 negative, Oncotype 24, 16% ROR Continues on 26m Anastrozole daily, began on 10/25/10. Last available Mammogram on 12/31/16 negative  PLAN:  -Discussed pt labwork today, 09/22/18; WBC  stable at 24.8k with Lymphs stable at 23.3k. ANC decreased to 700. PLT stable at 87k and HGB normal. LDH elevated but stable at 452 -Recommend following up with the prescribing physician of her antibiotic eyedrops for clarity on the recommended course -Recommend following up with GI regarding H.pylori infection and her antibiotic course which she reports not being regular with. -Discussed that H.pylori can be a cause of her thrombocytopenia in addition to her CLL -Her LDH could also be elevated due to other factors including low level hemolysis vs some element of BM scarring. Her LDH may not correlate with increased lymphoma activity -Discussed the criteria to consider initiating treatment including cytopenias, constitutional symptoms, and threat of organ injury -Pt prefers to continue watchful observation at this time -PLT lower at 87k a soft indication and will continue to monitor. Also noted in setting of H.pylori infection and current antibiotic and acid suppressant use, which confounds the picture. -Recommend again that pt finish her antibiotic course for full H.pylori clearance, and check in with her PCP/GI if she cannot tolerate her prescribed antibiotic -Pt has borderline splenomegaly and enlarged lymph nodes without bulkiness. Pt lacks constitutional symptoms. She does endorse some lower energy levels but cannot attribute this completely to CLL based on her description. -Recommend meal supplements to mitigate slow, mild weight loss.  -Will see the pt back in 2 months   RTC with Dr KIrene Limbowith labs in 2 months   All of the patients questions were answered with apparent satisfaction.  The patient knows to call the clinic with any problems, questions or concerns.  The total time spent in the appt was 25 minutes and more than 50% was on counseling and direct patient cares.    Sullivan Lone MD MS AAHIVMS [facility] Hematology/Oncology Physician [facility]  (Office):        [phone] (Work cell):  [phone] (Fax):           [phone]  09/22/2018 12:47 PM  I, Baldwin [place], am acting as a scribe for Dr. Sullivan Lone.   .I have reviewed the above documentation for accuracy and completeness, and I agree with the above. [name] MD

## 2018-09-22 ENCOUNTER — Other Ambulatory Visit: Payer: Self-pay

## 2018-09-22 ENCOUNTER — Inpatient Hospital Stay (HOSPITAL_BASED_OUTPATIENT_CLINIC_OR_DEPARTMENT_OTHER): Payer: Medicare Other | Admitting: Hematology

## 2018-09-22 ENCOUNTER — Inpatient Hospital Stay: Payer: Medicare Other | Attending: Hematology and Oncology

## 2018-09-22 ENCOUNTER — Telehealth: Payer: Self-pay | Admitting: Hematology

## 2018-09-22 VITALS — BP 140/58 | HR 77 | Temp 98.9°F | Resp 18 | Ht <= 58 in | Wt 125.2 lb

## 2018-09-22 DIAGNOSIS — C50412 Malignant neoplasm of upper-outer quadrant of left female breast: Secondary | ICD-10-CM | POA: Diagnosis not present

## 2018-09-22 DIAGNOSIS — Z87891 Personal history of nicotine dependence: Secondary | ICD-10-CM | POA: Diagnosis not present

## 2018-09-22 DIAGNOSIS — C8300 Small cell B-cell lymphoma, unspecified site: Secondary | ICD-10-CM

## 2018-09-22 DIAGNOSIS — Z79899 Other long term (current) drug therapy: Secondary | ICD-10-CM

## 2018-09-22 DIAGNOSIS — Z170 Estrogen receptor positive status [ER+]: Secondary | ICD-10-CM | POA: Diagnosis not present

## 2018-09-22 DIAGNOSIS — C9110 Chronic lymphocytic leukemia of B-cell type not having achieved remission: Secondary | ICD-10-CM

## 2018-09-22 DIAGNOSIS — Z800 Family history of malignant neoplasm of digestive organs: Secondary | ICD-10-CM | POA: Diagnosis not present

## 2018-09-22 DIAGNOSIS — Z79811 Long term (current) use of aromatase inhibitors: Secondary | ICD-10-CM

## 2018-09-22 DIAGNOSIS — D696 Thrombocytopenia, unspecified: Secondary | ICD-10-CM

## 2018-09-22 LAB — CBC WITH DIFFERENTIAL/PLATELET
Abs Immature Granulocytes: 0 10*3/uL (ref 0.00–0.07)
Basophils Absolute: 0 10*3/uL (ref 0.0–0.1)
Basophils Relative: 0 %
Eosinophils Absolute: 0.2 10*3/uL (ref 0.0–0.5)
Eosinophils Relative: 1 %
HCT: 39.7 % (ref 36.0–46.0)
Hemoglobin: 12.9 g/dL (ref 12.0–15.0)
Lymphocytes Relative: 94 %
Lymphs Abs: 23.3 10*3/uL — ABNORMAL HIGH (ref 0.7–4.0)
MCH: 32 pg (ref 26.0–34.0)
MCHC: 32.5 g/dL (ref 30.0–36.0)
MCV: 98.5 fL (ref 80.0–100.0)
Monocytes Absolute: 0.5 10*3/uL (ref 0.1–1.0)
Monocytes Relative: 2 %
Neutro Abs: 0.7 10*3/uL — ABNORMAL LOW (ref 1.7–17.7)
Neutrophils Relative %: 3 %
Platelets: 87 10*3/uL — ABNORMAL LOW (ref 150–400)
RBC: 4.03 MIL/uL (ref 3.87–5.11)
RDW: 13.7 % (ref 11.5–15.5)
WBC: 24.8 10*3/uL — ABNORMAL HIGH (ref 4.0–10.5)
nRBC: 0 % (ref 0.0–0.2)

## 2018-09-22 LAB — CMP (CANCER CENTER ONLY)
ALT: 11 U/L (ref 0–44)
AST: 30 U/L (ref 15–41)
Albumin: 4.1 g/dL (ref 3.5–5.0)
Alkaline Phosphatase: 56 U/L (ref 38–126)
Anion gap: 8 (ref 5–15)
BUN: 12 mg/dL (ref 8–23)
CO2: 26 mmol/L (ref 22–32)
Calcium: 8.8 mg/dL — ABNORMAL LOW (ref 8.9–10.3)
Chloride: 108 mmol/L (ref 98–111)
Creatinine: 1.02 mg/dL — ABNORMAL HIGH (ref 0.44–1.00)
GFR, Est AFR Am: 59 mL/min — ABNORMAL LOW (ref >60)
GFR, Estimated: 51 mL/min — ABNORMAL LOW (ref >60)
Glucose, Bld: 84 mg/dL (ref 70–99)
Potassium: 4.5 mmol/L (ref 3.5–5.1)
Sodium: 142 mmol/L (ref 135–145)
Total Bilirubin: 0.5 mg/dL (ref 0.3–1.2)
Total Protein: 6.6 g/dL (ref 6.5–8.1)

## 2018-09-22 LAB — RETICULOCYTES
Immature Retic Fract: 16.2 % — ABNORMAL HIGH (ref 2.3–15.9)
RBC.: 4.03 MIL/uL (ref 3.87–5.11)
Retic Count, Absolute: 85 10*3/uL (ref 19.0–186.0)
Retic Ct Pct: 2.1 % (ref 0.4–3.1)

## 2018-09-22 LAB — LACTATE DEHYDROGENASE: LDH: 452 U/L — ABNORMAL HIGH (ref 98–192)

## 2018-09-22 NOTE — Telephone Encounter (Signed)
Scheduled appt per 7/9 los. °Printed calendar. °

## 2018-09-28 ENCOUNTER — Telehealth: Payer: Self-pay | Admitting: Gastroenterology

## 2018-09-28 NOTE — Telephone Encounter (Signed)
Please schedule a virtual encounter with me to further review. Thank you.

## 2018-09-28 NOTE — Telephone Encounter (Signed)
Scheduled TELEPHONE encounter with Dr. Tarri Glenn on 7/16 at 8:30 am. Patient notified, verbalized understanding. Nothing further at the time of the call.

## 2018-09-28 NOTE — Telephone Encounter (Signed)
Pls call pt, she has some questions regarding her diagnoses.

## 2018-09-28 NOTE — Telephone Encounter (Signed)
Spoke to the patient who reported that she was still attempting to administer her clarithromycin and metronidazole that was prescribed on 05/31/2018 for H. Pylori. That patient states that for the last few months, she has been taking the medication on and off due to GI upset. The patient states she has taken more metronidazole than the clarithromycin because by process of elimination, the clarithromycin was the "culprit" in her GI disturbances. The patient called today to get advice on how to take the medication and prevent GI upset. This RN told the patient to hold the medication until Dr. Tarri Glenn gave instruction on how to proceed being that it's unlikely the medication reached a therapeutic level due to the way it was administered. Please advise.

## 2018-09-29 ENCOUNTER — Ambulatory Visit (INDEPENDENT_AMBULATORY_CARE_PROVIDER_SITE_OTHER): Payer: Medicare Other | Admitting: Gastroenterology

## 2018-09-29 ENCOUNTER — Encounter: Payer: Self-pay | Admitting: Gastroenterology

## 2018-09-29 DIAGNOSIS — R197 Diarrhea, unspecified: Secondary | ICD-10-CM | POA: Diagnosis not present

## 2018-09-29 DIAGNOSIS — B9681 Helicobacter pylori [H. pylori] as the cause of diseases classified elsewhere: Secondary | ICD-10-CM

## 2018-09-29 DIAGNOSIS — K279 Peptic ulcer, site unspecified, unspecified as acute or chronic, without hemorrhage or perforation: Secondary | ICD-10-CM

## 2018-09-29 MED ORDER — CLARITHROMYCIN 500 MG PO TABS: 500.0000 mg | ORAL_TABLET | Freq: Two times a day (BID) | ORAL | 0 refills | Status: DC

## 2018-09-29 MED ORDER — ONDANSETRON 4 MG PO TBDP: 4.0000 mg | ORAL_TABLET | Freq: Three times a day (TID) | ORAL | 0 refills | Status: DC | PRN

## 2018-09-29 MED ORDER — METRONIDAZOLE 500 MG PO TABS: 500.0000 mg | ORAL_TABLET | Freq: Two times a day (BID) | ORAL | 0 refills | Status: DC

## 2018-09-29 MED ORDER — OMEPRAZOLE 40 MG PO CPDR: 40.0000 mg | DELAYED_RELEASE_CAPSULE | Freq: Two times a day (BID) | ORAL | 0 refills | Status: DC

## 2018-09-29 MED ORDER — DICYCLOMINE HCL 20 MG PO TABS: 20.0000 mg | ORAL_TABLET | Freq: Four times a day (QID) | ORAL | 3 refills | Status: DC

## 2018-09-29 NOTE — Patient Instructions (Signed)
You will need to restart your H. pylori treatment to make sure that it works.  I am recommending that you use Zofran, a medication that can treat and prevent nausea, prior to each dose.  Our goal should be that you complete the full course of antibiotics over 10 days.  I am also recommending of the trial of Bentyl which can be used 4 times daily to help with your frequent bowel movements.  Let us plan to follow-up in 4 to 6 weeks.  Please call me with any questions or concerns in the meantime.

## 2018-09-29 NOTE — Progress Notes (Signed)
TELEHEALTH VISIT  Referring Provider: [name], MD Primary Care Physician:  [name], MD   Tele-visit due to COVID-19 pandemic Patient requested visit virtually, consented to the virtual encounter via audio enabled telemedicine application Contact made at: 08:30 09/29/18 Patient verified by name and date of birth Location of patient: Home Location provider: [place] [facility] Names of persons participating: Me, patient, Tinnie Gens CMA Time spent on telehealth visit: 28 minutes I discussed the limitations of evaluation and management by telemedicine. The patient expressed understanding and agreed to proceed.  Chief complaint:  diarrhea   IMPRESSION:  H pylori + duodenal ulcer    - EGD 05/19/18: 38m duodenal ulcer; gastric biopsies + H pylori    - intolerant to clarithromycin and amoxicillin due to GI side effects Diarrhea - predominantly post-prandial    - duodenal and random colon biopsies negative 05/19/18    - normal ESR, CRP, TSH and negative Giardia stool study.  Unintentional weight loss of 20 pounds    - stable over the last 3 months History of colon polyps    -Tubular adenoma on colonoscopy 2004    -Normal colonoscopies 2009 in 2014    -Two small tubular adenomas (cecal and descending colon) removed 05/19/18 Diverticulosis Family history of colon cancer in (mother) Family history of celiac (daughter) Family history of IBS (granddaughter)  ESR, and CRP were normal. Giardia testing was negative.  Fecal calprotectin was not performed.  Duodenal biopsies were negative for celiac disease. Colonoscopy with random biopsies was normal.  Trial of Bentyl recommended in an effort to control her postprandial symptoms.  She may also benefit from adding a daily stool bulking agent.   We reviewed the diagnosis of H. pylori and the need to complete antibiotics in a timely fashion to prevent resistance.  She agrees to restart a 10-day course of clarithromycin and amoxicillin.   Will use Zofran prophylactically to try to prevent GI side effects.  PLAN: - Zofran 464mSL taken prior to each antibiotic dose - Restart amoxicillin 1 gram twice a day for 10 days, clarithromycin 500 mg twice a day for 10 days, pantoprazole twice a day for 10 days then decrease it to once per day  - Trial of Bentyl 20 mg QID for postprandial stooling - H pylori testing after completing treatment - Follow-up virtual encounter in 4 to 6 weeks   Please see the "Patient Instructions" section for addition details about the plan.  HPI: Brianna GEERDESs a [age].o. female under evaluation for post-prandial diarrhea.  She was seen in consultation 05/13/18. She had an endoscopic evaluation 05/19/18.  Continues to have postprandial diarrhea. Formed stool. Significant urgency. 2-3 BM daily.  No blood or mucus.  Requires her to stay close to a bathroom. She has lost 20 pounds since symptoms first started but her weight has stabilized since her last visit with me. She is trying is drink plenty of water. Had one accident. No other associated symptoms. No identified exacerbating or relieving features.   Has not taken half of the antibiotics. Taking them "spaced out" (over many days and not concurrently)  has improved her compliance. She was experiencing severe nausea with the medications.   EGD showed a normal esophagus and stomach, and mild erythema in the duodenal bulb with a 76m81muodenal bulb ulcer.  Biopsies showed peptic duodenitis and H pylori gastritis.  Colonoscopy showed a large amount of stool, a 4mm76mcal tubular adenoma, a 1mm 79mular adenoma. Random colon biopsies were  negative for microscopic colitis.   Labs in March showed a normal ESR, CRP, TSH and negative Giardia stool study.    Past Medical History:  Diagnosis Date  . Adenomatous colon polyp    tubular  . Arthritis    osteo arthritis  . Breast cancer ([place]) 02/16/2011  . Diverticulosis   . GERD (gastroesophageal reflux disease)   .  Hyperlipidemia   . Leukemia ([place])   . Lymphoma ([place])    small lympocytic  . Osteopenia     Past Surgical History:  Procedure Laterality Date  . bartholian cyst  1986  . BREAST LUMPECTOMY Left 09-16-2010  . CATARACT EXTRACTION, BILATERAL    . [place]  . KNEE ARTHROSCOPY Right 2010  . LYMPH GLAND EXCISION Right 11/16/2014   Procedure: EXCISION AND BIOPSY OF RIGHT CERVICAL LYMPH NODE;  Surgeon: Jackolyn Confer, MD;  Location: [facility];  Service: General;  Laterality: Right;  . TONSILLECTOMY  1940    Current Outpatient Medications  Medication Sig Dispense Refill  . alendronate (FOSAMAX) 70 MG tablet Take 70 mg by mouth once a week. Take with a full glass of water on an empty stomach.    Marland Kitchen atorvastatin (LIPITOR) 10 MG tablet Take 10 mg by mouth daily.    . clarithromycin (BIAXIN) 500 MG tablet Take 1 tablet (500 mg total) by mouth 2 (two) times daily. 20 tablet 0  . metroNIDAZOLE (FLAGYL) 500 MG tablet Take 1 tablet (500 mg total) by mouth 2 (two) times daily. 20 tablet 0  . omeprazole (PRILOSEC) 40 MG capsule Take 1 capsule (40 mg total) by mouth 2 (two) times daily. Omeprazole 40 mg twice daily for 8 weeks. 120 capsule 0  . Vitamin D, Ergocalciferol, (DRISDOL) 50000 UNITS CAPS capsule Take 50,000 Units by mouth every 7 (seven) days.     No current facility-administered medications for this visit.     Allergies as of 09/29/2018 - Review Complete 09/29/2018  Allergen Reaction Noted  . Ciprofloxacin Nausea Only 12/20/2012  . Loratadine-pseudoephedrine er  08/01/2007  . Penicillins  08/01/2007  . Tolterodine tartrate  08/01/2007    Family History  Problem Relation Age of Onset  . Colon cancer Mother 56  . Colon cancer Maternal Aunt        had colon sugery  . Leukemia Father   . Diabetes Brother   . Diabetes Brother     Social History   Socioeconomic History  . Marital status: Widowed    Spouse name: Not on file  . Number of  children: 5  . Years of education: Not on file  . Highest education level: Not on file  Occupational History  . Occupation: retired  Scientific laboratory technician  . Financial resource strain: Not on file  . Food insecurity    Worry: Not on file    Inability: Not on file  . Transportation needs    Medical: Not on file    Non-medical: Not on file  Tobacco Use  . Smoking status: Former Smoker    Types: Cigarettes    Quit date: 1989    Years since quitting: 31.5  . Smokeless tobacco: Never Used  . Tobacco comment: Quit smoking 27 years ago  Substance and Sexual Activity  . Alcohol use: Yes    Comment: occasional glass wine  . Drug use: No  . Sexual activity: Not Currently  Lifestyle  . Physical activity    Days per week: Not on file  Minutes per session: Not on file  . Stress: Not on file  Relationships  . Social Herbalist on phone: Not on file    Gets together: Not on file    Attends religious service: Not on file    Active member of club or organization: Not on file    Attends meetings of clubs or organizations: Not on file    Relationship status: Not on file  . Intimate partner violence    Fear of current or ex partner: Not on file    Emotionally abused: Not on file    Physically abused: Not on file    Forced sexual activity: Not on file  Other Topics Concern  . Not on file  Social History Narrative  . Not on file   Physical Exam: Complete physical exam not performed due to the limits inherent in a telehealth encounter.  General: Awake, alert, and oriented, and well communicative. In no acute distress.  Pulm: No labored breathing, speaking in full sentences without conversational dyspnea  Psych: Pleasant, cooperative, normal speech, normal affect and normal insight Neuro: Alert and appropriate   Stehanie Ekstrom L. Tarri Glenn, MD, MPH [facility] 09/29/2018, 8:30 AM

## 2018-10-22 ENCOUNTER — Emergency Department
Admission: EM | Admit: 2018-10-22 | Discharge: 2018-10-22 | Disposition: A | Discharge status: Home / Self Care | Payer: Medicare Other | Attending: Emergency Medicine | Admitting: Emergency Medicine

## 2018-10-22 ENCOUNTER — Emergency Department: Payer: Medicare Other

## 2018-10-22 DIAGNOSIS — S299XXA Unspecified injury of thorax, initial encounter: Secondary | ICD-10-CM | POA: Diagnosis not present

## 2018-10-22 DIAGNOSIS — W109XXA Fall (on) (from) unspecified stairs and steps, initial encounter: Secondary | ICD-10-CM | POA: Diagnosis not present

## 2018-10-22 DIAGNOSIS — W228XXA Striking against or struck by other objects, initial encounter: Secondary | ICD-10-CM | POA: Diagnosis not present

## 2018-10-22 DIAGNOSIS — S0990XA Unspecified injury of head, initial encounter: Secondary | ICD-10-CM | POA: Diagnosis not present

## 2018-10-22 DIAGNOSIS — S2242XA Multiple fractures of ribs, left side, initial encounter for closed fracture: Secondary | ICD-10-CM | POA: Diagnosis not present

## 2018-10-22 DIAGNOSIS — W19XXXA Unspecified fall, initial encounter: Secondary | ICD-10-CM

## 2018-10-22 HISTORY — DX: Other specified bacterial intestinal infections: A04.8

## 2018-10-22 HISTORY — DX: Acute leukemia of unspecified cell type not having achieved remission: C95.00

## 2018-10-22 MED ORDER — TRAMADOL HCL 50 MG PO TABS
50.0000 mg | ORAL_TABLET | Freq: Four times a day (QID) | ORAL | Status: DC | PRN
Start: 2018-10-22 — End: 2018-10-22

## 2018-10-22 MED ORDER — HYDROCODONE-ACETAMINOPHEN 5-325 MG PO TABS
1.00 | ORAL_TABLET | Freq: Four times a day (QID) | ORAL | 0 refills | Status: AC | PRN
Start: 2018-10-22 — End: ?

## 2018-10-22 MED ORDER — TRAMADOL HCL 50 MG PO TABS
50.00 mg | ORAL_TABLET | Freq: Once | ORAL | Status: AC
Start: 2018-10-22 — End: 2018-10-22
  Administered 2018-10-22: 50 mg via ORAL
  Filled 2018-10-22: qty 1

## 2018-10-22 NOTE — ED Triage Notes (Signed)
Personal Protective Equipment (PPE)    Gloves, Goggles, Surgical / Bouffant Cap and surgical mask

## 2018-10-22 NOTE — ED Notes (Signed)
Pt discharge home, pt ambulated with a steady gait. all belonging with pt.   Pt discharge with son  Rx x 1 educated to pt. Rx sent to pharmacy, pharmacy verified with pt.   Pt verbalized understanding of d/c instructions, f/u instructions, and rx.

## 2018-10-22 NOTE — Discharge Instructions (Signed)
Rib Fracture    You have been diagnosed with a rib fracture ("broken rib").    Fracture means broken bone. Rib fractures are broken ribs. When there is a rib fracture, muscles between the ribs are also likely bruised. This is called a chest wall contusion. Both conditions are painful. This is because every breath moves the injured area. The conditions are not dangerous on their own. Sometimes complications happen. These include pneumonia or a collapsed lung. Rib fractures take 4 to 8 weeks to heal. Your pain should go down over this time.    Do not bind or tape your ribs. Binding or taping them may help with the pain. However, it makes the risk of pneumonia worse.    Cough and deep breathe at least 10 times an hour while awake. Do this even if it is painful. Support the area with a pillow or your hand. This will help with the pain. Use pain medicine as prescribed. This will help with the pain. You will be able to breathe normally. This will help you do the coughing and deep-breathing exercises. These exercises can help prevent pneumonia.    YOU SHOULD SEEK MEDICAL ATTENTION IMMEDIATELY, EITHER HERE OR AT THE NEAREST EMERGENCY DEPARTMENT, IF ANY OF THE FOLLOWING OCCURS:   Shortness of breath (wheezing or trouble breathing).   Coughing up green or yellow material.   Fever (temperature higher than 100.75F / 38C) or any fever that doesn't go away.   Severe chest pain or pain that suddenly gets worse.   No improvement in the next few days.               Rib Injury    You have been diagnosed with a rib injury.    The chest wall is made up of several structures, including the ribs. The ribs help protect the internal organs like the heart, lungs, liver and spleen. You have injured one or more of these bones.    Your X-ray today did not show a broken rib. Sometimes, X-rays dont find a broken rib. If you have a lot of pain, it is still possible that there is a broken rib.    Some symptoms of a broken rib can  be:   Pain over your rib.   Bruising over your rib.   Pain with taking a deep breath.   Feeling short of breath.    A broken rib does not need a cast. These types of breaks heal on their own. Even though a broken rib was not found on your X-ray, you might need another exam or more tests to find out why you have these symptoms. At this time, it doesnt seem that your symptoms are caused by anything dangerous. You dont need to stay in the hospital.    Things that you should do at home are:   Get plenty of rest.   Avoid repeated injury to the area.   Put warm or cold compresses on the affected area.   Take all pain medication as prescribed.   Do not do contact sports or heavy lifting.    Follow up with your doctor if your symptoms are not getting better.     Though we dont believe your condition is dangerous right now, it is important to be careful. Sometimes a problem that seems mild can become serious later. This is why it is very important that you return here or go to the nearest Emergency Department if you are not improving or  your symptoms are getting worse.    YOU SHOULD SEEK MEDICAL ATTENTION IMMEDIATELY, EITHER HERE OR AT THE NEAREST EMERGENCY DEPARTMENT, IF ANY OF THE FOLLOWING OCCUR:     You have serious chest pain.   You have trouble breathing or feel short of breath.   Your pain does not go away or gets worse.   You start coughing up blood.    If you can't follow up with your doctor, or if at any time you feel you need to be rechecked or seen again, come back here or go to the nearest emergency department.               Fall Prevention (Edu)    You asked for information on Fall Prevention.    There was a 2003 study from the Journal of the [employer] on fall-related injuries. It shows that more than 1.8 million adults, aged 99 and older, were treated in emergency departments for such injuries. More than 421,000 were hospitalized. The most common fall injuries are  head injuries. These in turn cause brain injury and fractures (broken bones). Hip fractures are the most serious types of broken bones that happen from falls. They lead to the most health problems and deaths.    To make the living area safer, older adults should:   Improve lighting throughout the home. Use night-lights to help see at night.   Have handrails put in on both sides of stairways.   Have grab bars put next to the toilet and in the shower. Also think about getting an elevated (high) toilet seat and a shower chair.   Use non-slip bath mats in the tub or shower.   Take out "throw rugs" to prevent tripping.   Avoid long robes to prevent tripping.   Wear well-fitted shoes or slippers. Loose footwear can make you shuffle. This makes you more likely to trip and fall. You can also buy inexpensive anti-slip socks.   Keep all electrical cords and small objects out of the pathway.   Any cane, walker or other assistive device used needs to be checked regularly. The devices must be used correctly to prevent injuries.   Move about at a pace that is comfortable for your ability. For example, do not rush to answer the doorbell or phone. Take your time.    Recent studies have identified some risk factors that make older adults more likely to have falls. Changing these risk factors helps to prevent falls.   Exercise: Regular physical activity or exercise make the body stronger. They also improve balance.   Medicine Review: Follow up with your doctor and pharmacist as needed to review your medicines and any new changes. They can tell you if there are side-effects or drug interactions (if the medicines affect other medicines you are taking). If you are taking sedatives or sleeping pills, it may be possible to lower the dosage or number of medicines. These kinds of medicines can cause drowsiness and dizziness. This makes it more likely you will fall.   Vision Checks: Follow up with an eye doctor at least once a  year to have your vision checked.               Rib Injury NOS    You have been diagnosed with a rib injury.    The chest wall is made up of several layers, including the ribs. The ribs help protect the internal organs like the heart, lungs, liver and spleen. You  have injured one or more of these bones.    Your X-ray today did not show a broken rib. Sometimes, X-rays dont find a broken rib. If you have a lot of pain, it is still possible that there is a broken rib.    Some symptoms of a rib injury can be:   Pain over your rib.   Bruising over your rib.   Pain with taking a deep breath.   Feeling short of breath.    Broken ribs do not need a cast. These types of injuries heal on their own. Even though a broken rib was not found on your X-ray, you might need another exam or more tests to find out why you have these symptoms. At this time, it doesnt seem that your symptoms are caused by anything dangerous. You dont need to stay in the hospital.      Things that you should do at home are:   Get plenty of rest.   Avoid repeated injury to the area.   Put warm or cold compresses on the affected area.   Take all pain medication as prescribed.   Do not do contact sports or heavy lifting.    Follow up with your doctor if your symptoms are not getting better.     Though we dont believe your condition is dangerous right now, it is important to be careful. Sometimes a problem that seems mild can become serious later. This is why it is very important that you return here or go to the nearest Emergency Department if you are not improving or your symptoms are getting worse.    YOU SHOULD SEEK MEDICAL ATTENTION IMMEDIATELY, EITHER HERE OR AT THE NEAREST EMERGENCY DEPARTMENT, IF ANY OF THE FOLLOWING OCCUR:     You have serious chest pain.   You have trouble breathing or feel short of breath.   Your pain does not go away or gets worse.   You start coughing up blood.    If you can't follow up with your  doctor, or if at any time you feel you need to be rechecked or seen again, come back here or go to the nearest emergency department.

## 2018-10-22 NOTE — ED Provider Notes (Signed)
EMERGENCY DEPARTMENT HISTORY AND PHYSICAL EXAM     None        Date: 10/22/2018  Patient Name: Cheryl Walton    History of Presenting Illness     Chief Complaint   Patient presents with    Fall    Rib Injury       History Provided By: Patient    Chief Complaint: left rib pain and fall  Duration: since 0200 last night  Timing:  ongoing  Location: left ribs  Quality: soreness, sharp pain  Severity: severe  Exacerbating factors: any movement or deep breaths  Alleviating factors: none  Associated Symptoms: none  Pertinent Negatives: No chest pain, fever, vomiting    Additional History: Cheryl Walton is a 84 y.o. female presenting to the ED with chief complaint of fall and left-sided rib pain.  Patient reports that she had some acid reflux last night and was getting up to get some baking soda when she fell down a set of 3 stairs landing on her left side injuring her left ribs and hitting her head.  There is no loss of consciousness.  Patient denies any usage of blood thinners.  She reports that since that time she has had severe pain in her left lateral chest.  No nausea or vomiting.  No abdominal pain.  No recent fevers or chills.  Patient reports the pain is worse with deep breaths or certain movements.  She has no other complaints at this time.  She does have a history of lymphoma and leukemia.  She is currently on antibiotics by her report.      PCP: Pcp, [place], MD  SPECIALISTS:    No current facility-administered medications for this encounter.      Current Outpatient Medications   Medication Sig Dispense Refill    atorvastatin (LIPITOR) 10 MG tablet atorvastatin 10 mg tablet      clarithromycin (BIAXIN) 500 MG tablet       HYDROcodone-acetaminophen (NORCO) 5-325 MG per tablet Take 1 tablet by mouth every 6 (six) hours as needed for Pain 9 tablet 0    metroNIDAZOLE (FLAGYL) 500 MG tablet       sulfamethoxazole-trimethoprim (BACTRIM DS) 800-160 MG per tablet sulfamethoxazole 800 mg-trimethoprim 160  mg tablet      valacyclovir (VALTREX) 1000 MG tablet       vitamin D, ergocalciferol, (DRISDOL) 50000 UNIT Cap          Past History     Past Medical History:  Past Medical History:   Diagnosis Date    H. pylori infection     Lymphocytic leukemia, acute, B- and T-cell        Past Surgical History:  Past Surgical History:   Procedure Laterality Date    CATARACT EXTRACTION      CESAREAN SECTION      EYE SURGERY      LUMPECTOMY         Family History:  History reviewed. No pertinent family history.    Social History:  Social History     Tobacco Use    Smoking status: Never Smoker    Smokeless tobacco: Never Used   Substance Use Topics    Alcohol use: Not Currently    Drug use: Not Currently       Allergies:  Allergies   Allergen Reactions    Penicillins        Review of Systems     Review of Systems   Constitutional: Negative for  fever.   HENT: Negative for sore throat.    Respiratory: Positive for shortness of breath (Secondary to pain).    Cardiovascular:        +chest wall pain on the left side   Gastrointestinal: Negative for abdominal pain, nausea and vomiting.   Musculoskeletal: Positive for arthralgias (Rib pain on the left).   Skin: Negative for rash.   Neurological: Negative for weakness and light-headedness.   All other systems reviewed and are negative.         Physical Exam   BP 115/71    Pulse 96    Temp 97.9 F (36.6 C) (Oral)    Resp 18    Ht 4\' 11"  (1.499 m)    Wt 55.3 kg    SpO2 95%    BMI 24.64 kg/m   Constitutional:  Well developed, well nourished. alert and awake  Head:  Atraumatic. Normocephalic.    Eyes:  PERRL. EOMI. No scleral icterus  ENT:  Mucous membranes are slightly tacky. Oropharynx is clear.  External ears normal. Patent airway.  Neck:  Supple. Full ROM.  No midline tenderness, step-offs, deformities.  Cardiovascular:  Regular rate. Regular rhythm. No murmurs, rubs, or gallops.  Pulmonary/Chest:  No evidence of respiratory distress. Clear to auscultation bilaterally.  No  wheezing, rales or rhonchi.  Very tender to palpation along the left lateral ribs.  There is no crepitus palpated.  Abdominal:  Soft and non-distended. No tenderness. No rebound, guarding, or rigidity.  Back: No midline tenderness, step-offs, deformities.  Decreased range of motion secondary to pain in her left ribs  Extremities:  No edema. No cyanosis. Full range of motion in all extremities.  No tenderness of bilateral hips or lower extremities.  No tenderness of shoulders or bilateral upper extremities.  Skin:  Skin is warm and dry.  No diaphoresis. No rash.   Neurological:  Alert, awake, and appropriate. Normal speech. Motor grossly normal. Cranial Nerves grossly intact by observation.  Normal gait.  Psychiatric:  Good eye contact. Normal interaction, affect, and behavior.        Diagnostic Study Results     Labs -     Results     ** No results found for the last 24 hours. **          Radiologic Studies -   Radiology Results (24 Hour)     Procedure Component Value Units Date/Time    Ribs Left/PA Chest [[id number]] Collected:  10/22/18 4098    Order Status:  Completed Updated:  10/22/18 0940    Narrative:       XR RIBS LEFT W PA CHEST 10/22/2018 9:12 AM    Clinical history: fall with rib injury.    Comparison: none available.      Impression:       Findings/impression: Left lower lobe atelectasis. There is no  infiltrate, effusion, consolidation or pneumothorax. The cardiac size is  within normal limits. The thoracic aorta is unremarkable.    There are multiple acute appearing left-sided rib fractures.    Cheryl Glassman, MD   10/22/2018 9:38 AM    CT Head without Contrast [[id number]] Collected:  10/22/18 0929    Order Status:  Completed Updated:  10/22/18 0933    Narrative:       CT HEAD WO CONTRAST    Clinical history: fall with head injury. no blood thinners    Comparison: None available    Technique: Axial images are obtained through the head. Sagittal and  coronal reformations were made.    Contrast dose:  none.    The following ?dose reduction techniques were utilized: automated  exposure control and/or adjustment of the mA and/or kV according  to patient size, and the use of iterative reconstruction technique.      Findings: There is mild to moderate parenchymal atrophy. Mild to  moderate ventricular dilatation, proportional to the amount of  parenchymal loss. Minimal hypodensities in the white matter of bilateral  cerebral hemispheres. There is no mass effect or midline shift. There  are no intra or extra-axial fluid collections. The basilar cisterns are  patent. Images of the visualized orbits and mastoids are unremarkable.  Mucosal thickening of bilateral ethmoidal air cells.      Impression:        No evidence of intracranial hemorrhage or large acute  territorial infarct.    Mild hypodensities in the white matter of bilateral cerebral  hemispheres.    Cheryl Glassman, MD   10/22/2018 9:31 AM      .    Medical Decision Making   I am the first provider for this patient.    I reviewed the vital signs, available nursing notes, past medical history, past surgical history, family history and social history.    Vital Signs-Reviewed the patient's vital signs.     No data found.    Pulse Oximetry Analysis - Normal At the time of this note, the patient's oxygen saturation is 96 % on RA.       Clinical Decision Support:       Old Medical Records:   REVIEWED:  Old medical records.  No prior records for my comparison.  Nursing notes.      ED Course:        Provider Notes: Patient presents for evaluation of rib pain after a fall.  Patient did hit her head although she did not lose consciousness.  Patient has been able to ambulate without significant difficulty.  CT scan of the head reveals no significant acute intracranial hemorrhage.  X-ray of the left ribs shows multiple rib fractures.  I clarified this with the radiologist and she states that she sees 2 rib fractures but is unsure if there could be more as the ribs were very  close together.  I discussed admission with the patient and she adamantly declined.  She wishes to go home at all cost.  Patient had incentive spirometer teaching performed and patient was able to pull greater than 1000 cc without significant difficulty.  Patient was given appropriate pain medication.  She was also advised on symptomatic control of her left rib pain as an outpatient.  She has no evidence of significant complications at this time.  No evidence of pneumonia.  No evidence of pneumothorax.  Patient has no other bony tenderness.  She was given strict return precautions and follow-up instructions and is stable for outpatient management of symptoms.  Patient and granddaughter and son agree with disposition and plan.  Stable for discharge.      Diagnosis     Clinical Impression:   1. Multiple fractures of ribs, left side, initial encounter for closed fracture    2. Fall, initial encounter        Treatment Plan:   ED Disposition     ED Disposition Condition Date/Time Comment    Discharge  Sat Oct 22, 2018 11:32 AM Theodore Demark discharge to home/self care.    Condition at disposition: Stable  _______________________________     Suanne Marker, DO  10/24/18 [phone]

## 2018-11-07 ENCOUNTER — Ambulatory Visit: Payer: Medicare Other | Admitting: Gastroenterology

## 2018-11-07 ENCOUNTER — Encounter: Payer: Self-pay | Admitting: Gastroenterology

## 2018-11-07 NOTE — Progress Notes (Signed)
Did not respond to calls at [phone] or [phone] at the time of our scheduled telehealth encounter this morning.  I left a message at each number asking her to call the office.

## 2018-11-22 ENCOUNTER — Telehealth: Payer: Self-pay | Admitting: Hematology

## 2018-11-22 NOTE — Telephone Encounter (Signed)
Returned patient's phone call regarding rescheduling 09/09 appointment, per patient's request appointment has been cancelled. Patient would like to call back when ready to reschedule.  Message to provider.

## 2018-11-23 ENCOUNTER — Inpatient Hospital Stay: Payer: Medicare Other

## 2018-11-23 ENCOUNTER — Inpatient Hospital Stay: Payer: Medicare Other | Admitting: Hematology

## 2018-11-24 DIAGNOSIS — E7849 Other hyperlipidemia: Secondary | ICD-10-CM | POA: Diagnosis not present

## 2018-11-24 DIAGNOSIS — M8580 Other specified disorders of bone density and structure, unspecified site: Secondary | ICD-10-CM | POA: Diagnosis not present

## 2018-11-28 DIAGNOSIS — R82998 Other abnormal findings in urine: Secondary | ICD-10-CM | POA: Diagnosis not present

## 2018-11-29 DIAGNOSIS — Z1331 Encounter for screening for depression: Secondary | ICD-10-CM | POA: Diagnosis not present

## 2018-11-29 DIAGNOSIS — Z0000 Encounter for general adult medical examination without abnormal findings: Secondary | ICD-10-CM | POA: Diagnosis not present

## 2018-11-29 DIAGNOSIS — N39498 Other specified urinary incontinence: Secondary | ICD-10-CM | POA: Diagnosis not present

## 2018-11-29 DIAGNOSIS — K219 Gastro-esophageal reflux disease without esophagitis: Secondary | ICD-10-CM | POA: Diagnosis not present

## 2018-11-29 DIAGNOSIS — R591 Generalized enlarged lymph nodes: Secondary | ICD-10-CM | POA: Diagnosis not present

## 2018-11-29 DIAGNOSIS — B009 Herpesviral infection, unspecified: Secondary | ICD-10-CM | POA: Diagnosis not present

## 2018-11-29 DIAGNOSIS — J309 Allergic rhinitis, unspecified: Secondary | ICD-10-CM | POA: Diagnosis not present

## 2018-11-29 DIAGNOSIS — C9100 Acute lymphoblastic leukemia not having achieved remission: Secondary | ICD-10-CM | POA: Diagnosis not present

## 2018-11-29 DIAGNOSIS — M179 Osteoarthritis of knee, unspecified: Secondary | ICD-10-CM | POA: Diagnosis not present

## 2018-11-29 DIAGNOSIS — E785 Hyperlipidemia, unspecified: Secondary | ICD-10-CM | POA: Diagnosis not present

## 2018-11-29 DIAGNOSIS — M8580 Other specified disorders of bone density and structure, unspecified site: Secondary | ICD-10-CM | POA: Diagnosis not present

## 2018-11-29 DIAGNOSIS — C50919 Malignant neoplasm of unspecified site of unspecified female breast: Secondary | ICD-10-CM | POA: Diagnosis not present

## 2018-12-05 DIAGNOSIS — H04213 Epiphora due to excess lacrimation, bilateral lacrimal glands: Secondary | ICD-10-CM | POA: Diagnosis not present

## 2018-12-05 DIAGNOSIS — Z23 Encounter for immunization: Secondary | ICD-10-CM | POA: Diagnosis not present

## 2018-12-07 DIAGNOSIS — Z1212 Encounter for screening for malignant neoplasm of rectum: Secondary | ICD-10-CM | POA: Diagnosis not present

## 2018-12-21 DIAGNOSIS — H04213 Epiphora due to excess lacrimation, bilateral lacrimal glands: Secondary | ICD-10-CM | POA: Diagnosis not present

## 2018-12-28 DIAGNOSIS — H04213 Epiphora due to excess lacrimation, bilateral lacrimal glands: Secondary | ICD-10-CM | POA: Diagnosis not present

## 2019-01-13 DIAGNOSIS — H04123 Dry eye syndrome of bilateral lacrimal glands: Secondary | ICD-10-CM | POA: Diagnosis not present

## 2019-01-13 DIAGNOSIS — H04322 Acute dacryocystitis of left lacrimal passage: Secondary | ICD-10-CM | POA: Diagnosis not present

## 2019-01-13 DIAGNOSIS — H18513 Endothelial corneal dystrophy, bilateral: Secondary | ICD-10-CM | POA: Diagnosis not present

## 2019-01-13 DIAGNOSIS — H04213 Epiphora due to excess lacrimation, bilateral lacrimal glands: Secondary | ICD-10-CM | POA: Diagnosis not present

## 2019-01-23 ENCOUNTER — Telehealth: Payer: Self-pay | Admitting: Hematology

## 2019-01-23 NOTE — Telephone Encounter (Signed)
Patient returned phone call regarding rescheduling missed September appointment, informed patient she will receive a call back once I get further assistance from providers nurse.

## 2019-01-23 NOTE — Telephone Encounter (Signed)
Returned patient's phone call regarding rescheduling an appointment, left a voicemail. 

## 2019-01-24 ENCOUNTER — Telehealth: Payer: Self-pay | Admitting: Hematology

## 2019-01-24 NOTE — Telephone Encounter (Signed)
Brianna York added patient's request per request for 11/17.

## 2019-01-31 ENCOUNTER — Other Ambulatory Visit: Payer: Self-pay

## 2019-01-31 ENCOUNTER — Telehealth: Payer: Self-pay | Admitting: Hematology

## 2019-01-31 ENCOUNTER — Inpatient Hospital Stay (HOSPITAL_BASED_OUTPATIENT_CLINIC_OR_DEPARTMENT_OTHER): Payer: Medicare Other | Admitting: Hematology

## 2019-01-31 ENCOUNTER — Inpatient Hospital Stay: Payer: Medicare Other | Attending: Hematology

## 2019-01-31 VITALS — BP 137/57 | HR 79 | Temp 98.3°F | Resp 18 | Ht <= 58 in | Wt 122.0 lb

## 2019-01-31 DIAGNOSIS — Z87891 Personal history of nicotine dependence: Secondary | ICD-10-CM | POA: Diagnosis not present

## 2019-01-31 DIAGNOSIS — Z79899 Other long term (current) drug therapy: Secondary | ICD-10-CM | POA: Diagnosis not present

## 2019-01-31 DIAGNOSIS — C9110 Chronic lymphocytic leukemia of B-cell type not having achieved remission: Secondary | ICD-10-CM | POA: Insufficient documentation

## 2019-01-31 DIAGNOSIS — C8300 Small cell B-cell lymphoma, unspecified site: Secondary | ICD-10-CM | POA: Diagnosis not present

## 2019-01-31 DIAGNOSIS — Z800 Family history of malignant neoplasm of digestive organs: Secondary | ICD-10-CM | POA: Diagnosis not present

## 2019-01-31 DIAGNOSIS — Z853 Personal history of malignant neoplasm of breast: Secondary | ICD-10-CM | POA: Insufficient documentation

## 2019-01-31 DIAGNOSIS — M8580 Other specified disorders of bone density and structure, unspecified site: Secondary | ICD-10-CM | POA: Insufficient documentation

## 2019-01-31 DIAGNOSIS — Z833 Family history of diabetes mellitus: Secondary | ICD-10-CM | POA: Diagnosis not present

## 2019-01-31 DIAGNOSIS — Z806 Family history of leukemia: Secondary | ICD-10-CM | POA: Diagnosis not present

## 2019-01-31 LAB — CMP (CANCER CENTER ONLY)
ALT: 11 U/L (ref 0–44)
AST: 23 U/L (ref 15–41)
Albumin: 4 g/dL (ref 3.5–5.0)
Alkaline Phosphatase: 66 U/L (ref 38–126)
Anion gap: 11 (ref 5–15)
BUN: 12 mg/dL (ref 8–23)
CO2: 25 mmol/L (ref 22–32)
Calcium: 9 mg/dL (ref 8.9–10.3)
Chloride: 106 mmol/L (ref 98–111)
Creatinine: 1.07 mg/dL — ABNORMAL HIGH (ref 0.44–1.00)
GFR, Est AFR Am: 56 mL/min — ABNORMAL LOW (ref >60)
GFR, Estimated: 48 mL/min — ABNORMAL LOW (ref >60)
Glucose, Bld: 79 mg/dL (ref 70–99)
Potassium: 4.6 mmol/L (ref 3.5–5.1)
Sodium: 142 mmol/L (ref 135–145)
Total Bilirubin: 0.4 mg/dL (ref 0.3–1.2)
Total Protein: 6.6 g/dL (ref 6.5–8.1)

## 2019-01-31 LAB — CBC WITH DIFFERENTIAL/PLATELET
Abs Immature Granulocytes: 0.04 10*3/uL (ref 0.00–0.07)
Basophils Absolute: 0.1 10*3/uL (ref 0.0–0.1)
Basophils Relative: 0 %
Eosinophils Absolute: 0.2 10*3/uL (ref 0.0–0.5)
Eosinophils Relative: 1 %
HCT: 37.4 % (ref 36.0–46.0)
Hemoglobin: 12.2 g/dL (ref 12.0–15.0)
Immature Granulocytes: 0 %
Lymphocytes Relative: 80 %
Lymphs Abs: 20.9 10*3/uL — ABNORMAL HIGH (ref 0.7–4.0)
MCH: 31.7 pg (ref 26.0–34.0)
MCHC: 32.6 g/dL (ref 30.0–36.0)
MCV: 97.1 fL (ref 80.0–100.0)
Monocytes Absolute: 2.8 10*3/uL — ABNORMAL HIGH (ref 0.1–1.0)
Monocytes Relative: 11 %
Neutro Abs: 2 10*3/uL (ref 1.7–7.7)
Neutrophils Relative %: 8 %
Platelets: 83 10*3/uL — ABNORMAL LOW (ref 150–400)
RBC: 3.85 MIL/uL — ABNORMAL LOW (ref 3.87–5.11)
RDW: 13.6 % (ref 11.5–15.5)
WBC: 26 10*3/uL — ABNORMAL HIGH (ref 4.0–10.5)
nRBC: 0 % (ref 0.0–0.2)

## 2019-01-31 LAB — LACTATE DEHYDROGENASE: LDH: 443 U/L — ABNORMAL HIGH (ref 98–192)

## 2019-01-31 NOTE — Telephone Encounter (Signed)
Scheduled per 11/17 los, patient received after visit summary and calender. °

## 2019-01-31 NOTE — Progress Notes (Signed)
HEMATOLOGY/ONCOLOGY CLINIC NOTE  Date of Service: 01/31/2019  Patient Care Team: [name], MD as PCP - General (Internal Medicine)  CHIEF COMPLAINTS/PURPOSE OF CONSULTATION:  Chronic Lymphocytic Leukemia  Oncologic History:  Oncology History  Breast cancer of upper-outer quadrant of left female breast ([place])  09/16/2010 Surgery   Left breast 1.5 cm invasive ductal carcinoma with calcifications, grade 1, perineural invasion was seen, DCIS, margins negative, 2 sentinel nodes negative, ER 99%, PR negative, Ki-67 79%, HER-2 negative, Oncotype 24, 16% ROR   10/25/2010 -  Anti-estrogen oral therapy   Arimidex 1 mg daily developed side effects and was held briefly and restarted   Lymphoma, small lymphocytic ([place])  11/16/2014 Initial Diagnosis   Lymphoma, small lymphocytic: Based on cervical lymph node biopsy CD5 positive CD10 negative     HISTORY OF PRESENTING ILLNESS:   Brianna York is a wonderful 84 y.o. female who has been referred to [place] by my colleague Dr. Nicholas Lose for evaluation and management of Chronic Lymphocytic Leukemia. The pt reports that she is doing well overall.  The pt was initially diagnosed with SLL/CLL in 2016 after a cervical lymph node biopsy. The pt reports that she "wears out easy, a little tired, otherwise active." She notes that she takes a nap in the afternoon but notes this could be attributable to her age. She denies her fatigue interrupting her daily life. The pt notes that she has lost about 10 pounds over the last 4 years. She notes that her appetite "has probably changed some," and notes that she eats less, noting she "fills up faster." The pt denies any fevers, chills, or night sweats. She continues to enjoy eating and is not food averse.   The pt notes that she senses some lumps under her neck and on either side of her neck, which have grown gradually and do not bother her. She denies SOB, abdominal fullness or pains, and denies leg swelling.  However, she has recently been taking antibiotics for H.pylori. She denies skin rashes.  She notes that she is unsure if she is continuing to take Anastrozole, which she began in 2012. She notes that she takes care of herself in her home. Her brother lives down the street and checks in on her every now and then. She lives in her own home at this time and endorses being able to function well.  Most recent lab results (05/23/18) of CBC w/diff and CMP is as follows: all values are WNL except for WBC at 32k, PLT at 112k, Lymphs abs at 27.4k, Monocytes abs at 2.1k, GFR at 53. 05/23/18 LDH at 444  On review of systems, pt reports gradual neck lumps, functioning well, mostly stable energy levels with some occasional tiredness, chronic back pain, and denies fevers, chills, night sweats, new fatigue, unexpected weight loss, armpit or groin swelling, CP, changes in breathing, SOB, abdominal pains, abdominal fullness, upper abdominal pains, leg swelling, calf swelling, skin rashes, and any other symptoms.  On PMHx the pt reports breast cancer diagnosed in 2012, CLL/SLL. On Family Hx the pt reports Father died of acute leukemia.  Interval History:   Brianna York returns today for management and evaluation of her CLL. The patient's last visit with [place] was on 09/22/2018. The pt reports that she is doing well overall.  The pt reports that she has been to the Optometrist about four times and has been using the eye drops prescribed but nothing is improving. Her Optometrist is treating her for  an eye infection. She has been having vision losses in both eyes but is worse in the right. Her eyes are not painful but they are stiff and are hard to open. She denies seasonal allergies that affect her eyes. Pt also reports that she fell down a fight of steps in August and she has had problems with her short-term memory since then. She had injuries to her ribs and head but no injuries to her eyes or face. Pt has lost 3 lbs  since our last meeting and denies any changes in eating habits. She does report occasional abdominal pain and does not have a follow up with her GI, Dr. Thornton Park, at this time. She is not scheduled to see her PCP until March of 2021. She took the medicine for her H. Pylori infection for about 1 week but did not finish due to the medication making her feel sick. She is willing to try the treatment again if it is discovered that her H. Pylori infection is ongoing. Pt currently lives alone but her brother lives beside her and she has other family in the area that can assist her if necessary. She has been drinking an adequate amount of water. Pt has continued to socially distance and keep herself safe in the midst of the pandemic.   Lab results today (01/31/19) of CBC w/diff and CMP is as follows: all values are WNL except for WBC at 26.0K, RBC at 3.85, PLTs at 83K, Lymphs abs at 20.9K, Mono Abs at 2.8K, Creatinine at 1.07, GFR Est Non Af Am at 48. 01/31/2019 LDH at 443  On review of systems, pt reports abdominal pain, eyelid stiffness, short-term memory issues and denies eye pain, new lumps/bumps, fever, chills, night sweats, bowel movement issues and any other symptoms.   MEDICAL HISTORY:  Past Medical History:  Diagnosis Date  . Adenomatous colon polyp    tubular  . Arthritis    osteo arthritis  . Breast cancer ([place]) 02/16/2011  . Diverticulosis   . GERD (gastroesophageal reflux disease)   . Hyperlipidemia   . Leukemia ([place])   . Lymphoma ([place])    small lympocytic  . Osteopenia     SURGICAL HISTORY: Past Surgical History:  Procedure Laterality Date  . bartholian cyst  1986  . BREAST LUMPECTOMY Left 09-16-2010  . CATARACT EXTRACTION, BILATERAL    . [place]  . KNEE ARTHROSCOPY Right 2010  . LYMPH GLAND EXCISION Right 11/16/2014   Procedure: EXCISION AND BIOPSY OF RIGHT CERVICAL LYMPH NODE;  Surgeon: Jackolyn Confer, MD;  Location: [facility];   Service: General;  Laterality: Right;  . TONSILLECTOMY  1940    SOCIAL HISTORY: Social History   Socioeconomic History  . Marital status: Widowed    Spouse name: Not on file  . Number of children: 5  . Years of education: Not on file  . Highest education level: Not on file  Occupational History  . Occupation: retired  Scientific laboratory technician  . Financial resource strain: Not on file  . Food insecurity    Worry: Not on file    Inability: Not on file  . Transportation needs    Medical: Not on file    Non-medical: Not on file  Tobacco Use  . Smoking status: Former Smoker    Types: Cigarettes    Quit date: 1989    Years since quitting: 31.8  . Smokeless tobacco: Never Used  . Tobacco comment: Quit smoking 27 years  ago  Substance and Sexual Activity  . Alcohol use: Yes    Comment: occasional glass wine  . Drug use: No  . Sexual activity: Not Currently  Lifestyle  . Physical activity    Days per week: Not on file    Minutes per session: Not on file  . Stress: Not on file  Relationships  . Social Herbalist on phone: Not on file    Gets together: Not on file    Attends religious service: Not on file    Active member of club or organization: Not on file    Attends meetings of clubs or organizations: Not on file    Relationship status: Not on file  . Intimate partner violence    Fear of current or ex partner: Not on file    Emotionally abused: Not on file    Physically abused: Not on file    Forced sexual activity: Not on file  Other Topics Concern  . Not on file  Social History Narrative  . Not on file    FAMILY HISTORY: Family History  Problem Relation Age of Onset  . Colon cancer Mother 8  . Colon cancer Maternal Aunt        had colon sugery  . Leukemia Father   . Diabetes Brother   . Diabetes Brother     ALLERGIES:  is allergic to ciprofloxacin; loratadine-pseudoephedrine er; penicillins; and tolterodine tartrate.  MEDICATIONS:  Current Outpatient  Medications  Medication Sig Dispense Refill  . alendronate (FOSAMAX) 70 MG tablet Take 70 mg by mouth once a week. Take with a full glass of water on an empty stomach.    Marland Kitchen atorvastatin (LIPITOR) 10 MG tablet Take 10 mg by mouth daily.    . clarithromycin (BIAXIN) 500 MG tablet Take 1 tablet (500 mg total) by mouth 2 (two) times daily. 20 tablet 0  . dicyclomine (BENTYL) 20 MG tablet Take 1 tablet (20 mg total) by mouth every 6 (six) hours. 30 tablet 3  . metroNIDAZOLE (FLAGYL) 500 MG tablet Take 1 tablet (500 mg total) by mouth 2 (two) times daily. 20 tablet 0  . omeprazole (PRILOSEC) 40 MG capsule Take 1 capsule (40 mg total) by mouth 2 (two) times daily. Omeprazole 40 mg twice daily for 8 weeks. 120 capsule 0  . ondansetron (ZOFRAN ODT) 4 MG disintegrating tablet Take 1 tablet (4 mg total) by mouth every 8 (eight) hours as needed for nausea or vomiting. 20 tablet 0  . Vitamin D, Ergocalciferol, (DRISDOL) 50000 UNITS CAPS capsule Take 50,000 Units by mouth every 7 (seven) days.     No current facility-administered medications for this visit.     REVIEW OF SYSTEMS:   A 10+ POINT REVIEW OF SYSTEMS WAS OBTAINED including neurology, dermatology, psychiatry, cardiac, respiratory, lymph, extremities, GI, GU, Musculoskeletal, constitutional, breasts, reproductive, HEENT.  All pertinent positives are noted in the HPI.  All others are negative.   PHYSICAL EXAMINATION: ECOG PERFORMANCE STATUS: 2 - Symptomatic, <50% confined to bed  . Vitals:   01/31/19 1216  BP: (!) 137/57  Pulse: 79  Resp: 18  Temp: 98.3 F (36.8 C)  SpO2: 97%   Filed Weights   01/31/19 1216  Weight: 122 lb (55.3 kg)   .Body mass index is 26.4 kg/m.  GENERAL:alert, in no acute distress and comfortable SKIN: no acute rashes, no significant lesions EYES: sclera anicteric, blepharitis OROPHARYNX: MMM, no exudates, no oropharyngeal erythema or ulceration NECK: supple, no JVD LYMPH:  Several palpable LNs bilateral  cervical, small palpable LN in left axillary, 1cm just palpable left inguinal lymph node LUNGS: clear to auscultation b/l with normal respiratory effort HEART: regular rate & rhythm ABDOMEN:  normoactive bowel sounds , non tender, not distended. No palpable hepatosplenomegaly.  Extremity: no pedal edema PSYCH: alert & oriented x 3 with fluent speech NEURO: no focal motor/sensory deficits  LABORATORY DATA:  I have reviewed the data as listed  . CBC Latest Ref Rng & Units 01/31/2019 09/22/2018 08/11/2018  WBC 4.0 - 10.5 K/uL 26.0(H) 24.8(H) 25.6(H)  Hemoglobin 12.0 - 15.0 g/dL 12.2 12.9 12.3  Hematocrit 36.0 - 46.0 % 37.4 39.7 39.2  Platelets 150 - 400 K/uL 83(L) 87(L) 82(L)    . CMP Latest Ref Rng & Units 01/31/2019 09/22/2018 08/11/2018  Glucose 70 - 99 mg/dL 79 84 90  BUN 8 - 23 mg/dL 12 12 16   Creatinine 0.44 - 1.00 mg/dL 1.07(H) 1.02(H) 1.02(H)  Sodium 135 - 145 mmol/L 142 142 140  Potassium 3.5 - 5.1 mmol/L 4.6 4.5 4.3  Chloride 98 - 111 mmol/L 106 108 107  CO2 22 - 32 mmol/L 25 26 25   Calcium 8.9 - 10.3 mg/dL 9.0 8.8(L) 9.3  Total Protein 6.5 - 8.1 g/dL 6.6 6.6 6.3(L)  Total Bilirubin 0.3 - 1.2 mg/dL 0.4 0.5 0.5  Alkaline Phos 38 - 126 U/L 66 56 54  AST 15 - 41 U/L 23 30 25   ALT 0 - 44 U/L 11 11 8    . Lab Results  Component Value Date   LDH 443 (H) 01/31/2019     02/21/18 Left Lacrimal sac biopsy:    12/18/14 Cytogenetics:    12/04/14 Right cervical lymph node biopsy:   06/27/18 FISH CLL Prognostic Panel:    RADIOGRAPHIC STUDIES: I have personally reviewed the radiological images as listed and agreed with the findings in the report. No results found.  ASSESSMENT & PLAN:   84 y.o. female with  1. Chronic Lymphocytic Leukemia 11/16/14 Right cervical lymph node biopsy revealed SLL/CLL 12/18/14 Cytogenetics revealed 59% of cells with gain of CEP12 and 5% of cells with loss of ATM 02/21/18 Right lacrimal gland revealed lymphoid infiltrates 06/27/18 FISH CLL  Prognostic Panel revealed 49.33% of cells with Trisomy 12, and only 2% of cells with 17p mutation  08/03/18 PET/CT revealed "Exam positive for FDG avid adenopathy within the neck, chest, abdomen and pelvis compatible with the clinical history of chronic lymphocytic leukemia. 2. Splenomegaly (about 13cm) with mild increased radiotracer uptake throughout the spleen (above blood pool activity)".   2. History of Invasive Ductal Carcinoma 09/16/10 Left breast 1.5cm IDC with calcifications, grade 1, perineural invasion. DCIS margins negative, 2 sentinel nodes negative, ER 99%, PR negative, Ki-67 at 79%, HER-2 negative, Oncotype 24, 16% ROR Continues on 72m Anastrozole daily, began on 10/25/10. Last available Mammogram on 12/31/16 negative  PLAN:  -Discussed pt labwork today, 01/31/19; all values are WNL except for WBC at 26.0K, RBC at 3.85, PLTs at 83K, Lymphs abs at 20.9K, Mono Abs at 2.8K, Creatinine at 1.07, GFR Est Non Af Am at 48. -Discussed 01/31/2019 LDH is high at 443, but stable - does not appear to correlate with CLL  -No clinical or lab indication for progression of pt's CLL at this time -Pt prefers to continue watchful observation at this time -Recommend meal supplements to mitigate slow, mild weight loss.  -Advised pt to connect with PCP or GI to address incomplete treatment of H. Pylori infection -Recommended f/u with  Optometrist Dr. Hetty Blend  -Will see back in 4 months with labs    FOLLOW UP: RTC with Dr Irene Limbo with labs in 4 months  The total time spent in the appt was 25 minutes and more than 50% was on counseling and direct patient cares.  All of the patient's questions were answered with apparent satisfaction. The patient knows to call the clinic with any problems, questions or concerns.    Sullivan Lone MD [place] AAHIVMS [facility] Hematology/Oncology Physician [facility]  (Office):       [phone] (Work cell):  [phone] (Fax):            [phone]  01/31/2019 6:02 PM  I, Yevette Edwards, am acting as a scribe for Dr. Sullivan Lone.   .I have reviewed the above documentation for accuracy and completeness, and I agree with the above. [name] MD

## 2019-05-29 ENCOUNTER — Telehealth: Payer: Self-pay | Admitting: Hematology

## 2019-05-29 NOTE — Telephone Encounter (Signed)
Per sch msg, patient wanted to reschedule todays appt. Called and spoke with patient. She would rather cancel and call back at a later date to reschedule due to covid. Cancelled appts per Patients request. Informed MD

## 2019-05-31 ENCOUNTER — Inpatient Hospital Stay: Payer: Private Health Insurance - Indemnity

## 2019-05-31 ENCOUNTER — Inpatient Hospital Stay: Payer: Private Health Insurance - Indemnity | Admitting: Hematology

## 2019-07-17 ENCOUNTER — Other Ambulatory Visit: Payer: Self-pay

## 2019-07-17 ENCOUNTER — Inpatient Hospital Stay: Payer: Medicare HMO | Attending: Hematology

## 2019-07-17 ENCOUNTER — Inpatient Hospital Stay (HOSPITAL_BASED_OUTPATIENT_CLINIC_OR_DEPARTMENT_OTHER): Payer: Medicare HMO | Admitting: Hematology

## 2019-07-17 VITALS — BP 130/60 | HR 71 | Temp 97.6°F | Resp 18 | Ht <= 58 in | Wt 121.4 lb

## 2019-07-17 DIAGNOSIS — M8580 Other specified disorders of bone density and structure, unspecified site: Secondary | ICD-10-CM | POA: Insufficient documentation

## 2019-07-17 DIAGNOSIS — Z170 Estrogen receptor positive status [ER+]: Secondary | ICD-10-CM | POA: Diagnosis not present

## 2019-07-17 DIAGNOSIS — C8300 Small cell B-cell lymphoma, unspecified site: Secondary | ICD-10-CM

## 2019-07-17 DIAGNOSIS — C9110 Chronic lymphocytic leukemia of B-cell type not having achieved remission: Secondary | ICD-10-CM | POA: Diagnosis present

## 2019-07-17 DIAGNOSIS — Z87891 Personal history of nicotine dependence: Secondary | ICD-10-CM | POA: Diagnosis not present

## 2019-07-17 DIAGNOSIS — Z800 Family history of malignant neoplasm of digestive organs: Secondary | ICD-10-CM | POA: Insufficient documentation

## 2019-07-17 DIAGNOSIS — C50412 Malignant neoplasm of upper-outer quadrant of left female breast: Secondary | ICD-10-CM | POA: Diagnosis present

## 2019-07-17 DIAGNOSIS — E785 Hyperlipidemia, unspecified: Secondary | ICD-10-CM | POA: Insufficient documentation

## 2019-07-17 DIAGNOSIS — D696 Thrombocytopenia, unspecified: Secondary | ICD-10-CM | POA: Diagnosis not present

## 2019-07-17 DIAGNOSIS — Z806 Family history of leukemia: Secondary | ICD-10-CM | POA: Insufficient documentation

## 2019-07-17 DIAGNOSIS — Z833 Family history of diabetes mellitus: Secondary | ICD-10-CM | POA: Insufficient documentation

## 2019-07-17 DIAGNOSIS — Z79811 Long term (current) use of aromatase inhibitors: Secondary | ICD-10-CM | POA: Insufficient documentation

## 2019-07-17 DIAGNOSIS — Z79899 Other long term (current) drug therapy: Secondary | ICD-10-CM | POA: Insufficient documentation

## 2019-07-17 LAB — CMP (CANCER CENTER ONLY)
ALT: 11 U/L (ref 0–44)
AST: 28 U/L (ref 15–41)
Albumin: 3.8 g/dL (ref 3.5–5.0)
Alkaline Phosphatase: 56 U/L (ref 38–126)
Anion gap: 9 (ref 5–15)
BUN: 12 mg/dL (ref 8–23)
CO2: 26 mmol/L (ref 22–32)
Calcium: 8.9 mg/dL (ref 8.9–10.3)
Chloride: 108 mmol/L (ref 98–111)
Creatinine: 0.95 mg/dL (ref 0.44–1.00)
GFR, Est AFR Am: >60 mL/min (ref >60)
GFR, Estimated: 55 mL/min — ABNORMAL LOW (ref >60)
Glucose, Bld: 86 mg/dL (ref 70–99)
Potassium: 4.7 mmol/L (ref 3.5–5.1)
Sodium: 143 mmol/L (ref 135–145)
Total Bilirubin: 0.4 mg/dL (ref 0.3–1.2)
Total Protein: 6.5 g/dL (ref 6.5–8.1)

## 2019-07-17 LAB — CBC WITH DIFFERENTIAL/PLATELET
Abs Immature Granulocytes: 0.07 10*3/uL (ref 0.00–0.07)
Basophils Absolute: 0 10*3/uL (ref 0.0–0.1)
Basophils Relative: 0 %
Eosinophils Absolute: 0.2 10*3/uL (ref 0.0–0.5)
Eosinophils Relative: 1 %
HCT: 37.8 % (ref 36.0–46.0)
Hemoglobin: 11.9 g/dL — ABNORMAL LOW (ref 12.0–15.0)
Immature Granulocytes: 0 %
Lymphocytes Relative: 80 %
Lymphs Abs: 28.1 10*3/uL — ABNORMAL HIGH (ref 0.7–4.0)
MCH: 31.2 pg (ref 26.0–34.0)
MCHC: 31.5 g/dL (ref 30.0–36.0)
MCV: 99.2 fL (ref 80.0–100.0)
Monocytes Absolute: 4.4 10*3/uL — ABNORMAL HIGH (ref 0.1–1.0)
Monocytes Relative: 13 %
Neutro Abs: 2.2 10*3/uL (ref 1.7–7.7)
Neutrophils Relative %: 6 %
Platelets: 84 10*3/uL — ABNORMAL LOW (ref 150–400)
RBC: 3.81 MIL/uL — ABNORMAL LOW (ref 3.87–5.11)
RDW: 14.1 % (ref 11.5–15.5)
WBC: 34.9 10*3/uL — ABNORMAL HIGH (ref 4.0–10.5)
nRBC: 0 % (ref 0.0–0.2)

## 2019-07-17 LAB — LACTATE DEHYDROGENASE: LDH: 498 U/L — ABNORMAL HIGH (ref 98–192)

## 2019-07-17 NOTE — Progress Notes (Signed)
HEMATOLOGY/ONCOLOGY CLINIC NOTE  Date of Service: 07/17/2019  Patient Care Team: [name], MD as PCP - General (Internal Medicine)  CHIEF COMPLAINTS/PURPOSE OF CONSULTATION:  Chronic Lymphocytic Leukemia  Oncologic History:  Oncology History  Breast cancer of upper-outer quadrant of left female breast ([place])  09/16/2010 Surgery   Left breast 1.5 cm invasive ductal carcinoma with calcifications, grade 1, perineural invasion was seen, DCIS, margins negative, 2 sentinel nodes negative, ER 99%, PR negative, Ki-67 79%, HER-2 negative, Oncotype 24, 16% ROR   10/25/2010 -  Anti-estrogen oral therapy   Arimidex 1 mg daily developed side effects and was held briefly and restarted   Lymphoma, small lymphocytic ([place])  11/16/2014 Initial Diagnosis   Lymphoma, small lymphocytic: Based on cervical lymph node biopsy CD5 positive CD10 negative     HISTORY OF PRESENTING ILLNESS:   Brianna York is a wonderful 85 y.o. female who has been referred to [place] by my colleague Dr. Nicholas Lose for evaluation and management of Chronic Lymphocytic Leukemia. The pt reports that she is doing well overall.  The pt was initially diagnosed with SLL/CLL in 2016 after a cervical lymph node biopsy. The pt reports that she "wears out easy, a little tired, otherwise active." She notes that she takes a nap in the afternoon but notes this could be attributable to her age. She denies her fatigue interrupting her daily life. The pt notes that she has lost about 10 pounds over the last 4 years. She notes that her appetite "has probably changed some," and notes that she eats less, noting she "fills up faster." The pt denies any fevers, chills, or night sweats. She continues to enjoy eating and is not food averse.   The pt notes that she senses some lumps under her neck and on either side of her neck, which have grown gradually and do not bother her. She denies SOB, abdominal fullness or pains, and denies leg swelling.  However, she has recently been taking antibiotics for H.pylori. She denies skin rashes.  She notes that she is unsure if she is continuing to take Anastrozole, which she began in 2012. She notes that she takes care of herself in her home. Her brother lives down the street and checks in on her every now and then. She lives in her own home at this time and endorses being able to function well.  Most recent lab results (05/23/18) of CBC w/diff and CMP is as follows: all values are WNL except for WBC at 32k, PLT at 112k, Lymphs abs at 27.4k, Monocytes abs at 2.1k, GFR at 53. 05/23/18 LDH at 444  On review of systems, pt reports gradual neck lumps, functioning well, mostly stable energy levels with some occasional tiredness, chronic back pain, and denies fevers, chills, night sweats, new fatigue, unexpected weight loss, armpit or groin swelling, CP, changes in breathing, SOB, abdominal pains, abdominal fullness, upper abdominal pains, leg swelling, calf swelling, skin rashes, and any other symptoms.  On PMHx the pt reports breast cancer diagnosed in 2012, CLL/SLL. On Family Hx the pt reports Father died of acute leukemia.  Interval History:   Brianna York returns today for management and evaluation of her CLL. The patient's last visit with [place] was on 01/31/2019. The pt reports that she is doing well overall.  The pt reports that she is still having difficulty with her memory after her fall in August 2020. Her brother lives close to her and she does have help in her  home sometimes. She did not contact her PCP for repeat stool testing and is unsure if she still has an H. Pylori infection. Pt still has swollen eye lids and has been to see her Optometrist four times. They have given her drops to treat, but it has not resolved her swelling. She has no history of allergies. She notes that she does have "gunk" in her eyes when she wakes up in the morning. Pt has previously seen Dr. Isidoro Donning for a tear duct  blockage. Pt continues to eat well and her weight has remained fairly steady. She has noticed some growth in her cervical lymph nodes but they are not painful, or too bothersome.   Lab results today (07/17/19) of CBC w/diff and CMP is as follows: all values are WNL except for WBC at 34.9, RBC at 3.81, Hgb at 11.9, PLT at 84K, Lymphs Abs at 28.1K, Mono Abs at 4.4K, GFR Est Non Af Am at 55 07/17/2019 LDH at 498  On review of systems, pt reports growing lymph nodes, memory issues, swollen eyelids and denies fevers, chills, night sweats, rapid weight loss, SOB, low appetite, abdominal swelling, leg swelling and any other symptoms.   MEDICAL HISTORY:  Past Medical History:  Diagnosis Date  . Adenomatous colon polyp    tubular  . Arthritis    osteo arthritis  . Breast cancer ([place]) 02/16/2011  . Diverticulosis   . GERD (gastroesophageal reflux disease)   . Hyperlipidemia   . Leukemia ([place])   . Lymphoma ([place])    small lympocytic  . Osteopenia     SURGICAL HISTORY: Past Surgical History:  Procedure Laterality Date  . bartholian cyst  1986  . BREAST LUMPECTOMY Left 09-16-2010  . CATARACT EXTRACTION, BILATERAL    . [place]  . KNEE ARTHROSCOPY Right 2010  . LYMPH GLAND EXCISION Right 11/16/2014   Procedure: EXCISION AND BIOPSY OF RIGHT CERVICAL LYMPH NODE;  Surgeon: Jackolyn Confer, MD;  Location: [facility];  Service: General;  Laterality: Right;  . TONSILLECTOMY  1940    SOCIAL HISTORY: Social History   Socioeconomic History  . Marital status: Widowed    Spouse name: Not on file  . Number of children: 5  . Years of education: Not on file  . Highest education level: Not on file  Occupational History  . Occupation: retired  Tobacco Use  . Smoking status: Former Smoker    Types: Cigarettes    Quit date: 1989    Years since quitting: 32.3  . Smokeless tobacco: Never Used  . Tobacco comment: Quit smoking 27 years ago  Substance and Sexual  Activity  . Alcohol use: Yes    Comment: occasional glass wine  . Drug use: No  . Sexual activity: Not Currently  Other Topics Concern  . Not on file  Social History Narrative  . Not on file   Social Determinants of Health   Financial Resource Strain:   . Difficulty of Paying Living Expenses:   Food Insecurity:   . Worried About Charity fundraiser in the Last Year:   . Arboriculturist in the Last Year:   Transportation Needs:   . Film/video editor (Medical):   Marland Kitchen Lack of Transportation (Non-Medical):   Physical Activity:   . Days of Exercise per Week:   . Minutes of Exercise per Session:   Stress:   . Feeling of Stress :   Social Connections:   . Frequency  of Communication with Friends and Family:   . Frequency of Social Gatherings with Friends and Family:   . Attends Religious Services:   . Active Member of Clubs or Organizations:   . Attends Archivist Meetings:   Marland Kitchen Marital Status:   Intimate Partner Violence:   . Fear of Current or Ex-Partner:   . Emotionally Abused:   Marland Kitchen Physically Abused:   . Sexually Abused:     FAMILY HISTORY: Family History  Problem Relation Age of Onset  . Colon cancer Mother 86  . Colon cancer Maternal Aunt        had colon sugery  . Leukemia Father   . Diabetes Brother   . Diabetes Brother     ALLERGIES:  is allergic to ciprofloxacin; loratadine-pseudoephedrine er; penicillins; and tolterodine tartrate.  MEDICATIONS:  Current Outpatient Medications  Medication Sig Dispense Refill  . alendronate (FOSAMAX) 70 MG tablet Take 70 mg by mouth once a week. Take with a full glass of water on an empty stomach.    Marland Kitchen atorvastatin (LIPITOR) 10 MG tablet Take 10 mg by mouth daily.    . Vitamin D, Ergocalciferol, (DRISDOL) 50000 UNITS CAPS capsule Take 50,000 Units by mouth every 7 (seven) days.     No current facility-administered medications for this visit.    REVIEW OF SYSTEMS:   A 10+ POINT REVIEW OF SYSTEMS WAS OBTAINED  including neurology, dermatology, psychiatry, cardiac, respiratory, lymph, extremities, GI, GU, Musculoskeletal, constitutional, breasts, reproductive, HEENT.  All pertinent positives are noted in the HPI.  All others are negative.   PHYSICAL EXAMINATION: ECOG PERFORMANCE STATUS: 2 - Symptomatic, <50% confined to bed  . Vitals:   07/17/19 1503  BP: 130/60  Pulse: 71  Resp: 18  Temp: 97.6 F (36.4 C)  SpO2: 97%   Filed Weights   07/17/19 1503  Weight: 121 lb 6.4 oz (55.1 kg)   .Body mass index is 26.27 kg/m.  GENERAL:alert, in no acute distress and comfortable SKIN: no acute rashes, no significant lesions EYES: conjunctiva are pink and non-injected, sclera anicteric, blepharitis b/l OROPHARYNX: MMM, no exudates, no oropharyngeal erythema or ulceration NECK: supple, no JVD LYMPH:  no palpable lymphadenopathy in the axillary or inguinal regions. Multiple palpable lymph nodes in cervical region, up to 2 cm. May be increased from last visit.  LUNGS: clear to auscultation b/l with normal respiratory effort HEART: regular rate & rhythm ABDOMEN:  normoactive bowel sounds , non tender, not distended. No palpable hepatosplenomegaly.  Extremity: no pedal edema PSYCH: alert & oriented x 3 with fluent speech NEURO: no focal motor/sensory deficits   LABORATORY DATA:  I have reviewed the data as listed  . CBC Latest Ref Rng & Units 07/17/2019 01/31/2019 09/22/2018  WBC 4.0 - 10.5 K/uL 34.9(H) 26.0(H) 24.8(H)  Hemoglobin 12.0 - 15.0 g/dL 11.9(L) 12.2 12.9  Hematocrit 36.0 - 46.0 % 37.8 37.4 39.7  Platelets 150 - 400 K/uL 84(L) 83(L) 87(L)    . CMP Latest Ref Rng & Units 07/17/2019 01/31/2019 09/22/2018  Glucose 70 - 99 mg/dL 86 79 84  BUN 8 - 23 mg/dL 12 12 12   Creatinine 0.44 - 1.00 mg/dL 0.95 1.07(H) 1.02(H)  Sodium 135 - 145 mmol/L 143 142 142  Potassium 3.5 - 5.1 mmol/L 4.7 4.6 4.5  Chloride 98 - 111 mmol/L 108 106 108  CO2 22 - 32 mmol/L 26 25 26   Calcium 8.9 - 10.3 mg/dL 8.9 9.0  8.8(L)  Total Protein 6.5 - 8.1 g/dL 6.5 6.6 6.6  Total Bilirubin 0.3 - 1.2 mg/dL 0.4 0.4 0.5  Alkaline Phos 38 - 126 U/L 56 66 56  AST 15 - 41 U/L 28 23 30   ALT 0 - 44 U/L 11 11 11    . Lab Results  Component Value Date   LDH 498 (H) 07/17/2019     02/21/18 Left Lacrimal sac biopsy:    12/18/14 Cytogenetics:    12/04/14 Right cervical lymph node biopsy:   06/27/18 FISH CLL Prognostic Panel:    RADIOGRAPHIC STUDIES: I have personally reviewed the radiological images as listed and agreed with the findings in the report. No results found.  ASSESSMENT & PLAN:   85 y.o. female with  1. Chronic Lymphocytic Leukemia 11/16/14 Right cervical lymph node biopsy revealed SLL/CLL 12/18/14 Cytogenetics revealed 59% of cells with gain of CEP12 and 5% of cells with loss of ATM 02/21/18 Right lacrimal gland revealed lymphoid infiltrates 06/27/18 FISH CLL Prognostic Panel revealed 49.33% of cells with Trisomy 12, and only 2% of cells with 17p mutation  08/03/18 PET/CT revealed "Exam positive for FDG avid adenopathy within the neck, chest, abdomen and pelvis compatible with the clinical history of chronic lymphocytic leukemia. 2. Splenomegaly (about 13cm) with mild increased radiotracer uptake throughout the spleen (above blood pool activity)".   2. History of Invasive Ductal Carcinoma 09/16/10 Left breast 1.5cm IDC with calcifications, grade 1, perineural invasion. DCIS margins negative, 2 sentinel nodes negative, ER 99%, PR negative, Ki-67 at 79%, HER-2 negative, Oncotype 24, 16% ROR Continues on 26m Anastrozole daily, began on 10/25/10. Last available Mammogram on 12/31/16 negative  PLAN:  -Discussed pt labwork today, 07/17/19; WBC slowly increasing, Hgb trending down, PLT are chronically low, blood chemistries are steady, LDH is high but has not correlated well with her disease  -Recommend pt wash her eyes with gentle cleanser, like tear-free baby shampoo, when coming in from  outside -Recommend pt no longer operate any motorvehicles due to issues with memory and coordination -The pt shows no lab or clinical evidence of significant CLL progression of at this time. -No indication to begin treatment at this time. Will continue watchful observation.  -Discussed referring pt to SEducation officer, museumfor assistance with getting in-home care.  -Advised pt to connect with PCP or GI to confirm absence of H. Pylori infection   FOLLOW UP: RTC with Dr KIrene Limbowith labs in 6 months  The total time spent in the appt was 25 minutes and more than 50% was on counseling and direct patient cares.  All of the patient's questions were answered with apparent satisfaction. The patient knows to call the clinic with any problems, questions or concerns.    GSullivan LoneMD MWelcomeAAHIVMS SO[facility] Surgery CenterCSabetha Community HospitalHematology/Oncology Physician [facility] (Office):       [phone](Work cell):  [phone](Fax):           [phone] 07/17/2019 5:04 PM  I, JYevette Edwards am acting as a scribe for Dr. GSullivan Lone   .I have reviewed the above documentation for accuracy and completeness, and I agree with the above. .Brunetta GeneraMD

## 2019-07-18 ENCOUNTER — Telehealth: Payer: Self-pay | Admitting: Hematology

## 2019-07-18 NOTE — Telephone Encounter (Signed)
No los per 5/3.

## 2019-08-28 ENCOUNTER — Ambulatory Visit
Admission: RE | Admit: 2019-08-28 | Discharge: 2019-08-28 | Disposition: A | Discharge status: Home / Self Care | Payer: Medicare HMO | Source: Ambulatory Visit | Attending: Internal Medicine | Admitting: Internal Medicine

## 2019-08-28 ENCOUNTER — Other Ambulatory Visit: Payer: Self-pay | Admitting: Internal Medicine

## 2019-08-28 DIAGNOSIS — R109 Unspecified abdominal pain: Secondary | ICD-10-CM

## 2019-08-28 DIAGNOSIS — R1031 Right lower quadrant pain: Secondary | ICD-10-CM

## 2019-08-28 MED ORDER — IOPAMIDOL (ISOVUE-300) INJECTION 61%
100.0000 mL | Freq: Once | INTRAVENOUS | Status: AC | PRN
  Administered 2019-08-28: 100 mL via INTRAVENOUS

## 2019-10-23 ENCOUNTER — Ambulatory Visit: Payer: Medicare HMO | Admitting: Neurology

## 2019-11-01 ENCOUNTER — Telehealth: Payer: Self-pay | Admitting: *Deleted

## 2019-11-01 ENCOUNTER — Other Ambulatory Visit: Payer: Self-pay | Admitting: Ophthalmology

## 2019-11-01 DIAGNOSIS — C6961 Malignant neoplasm of right orbit: Secondary | ICD-10-CM

## 2019-11-01 NOTE — Telephone Encounter (Signed)
Contacted by Dr. Sarajane Marek - ophthalmologist - [phone]. He requested Dr. Irene Limbo call him asap.  Dr.Kale given information and message forwarded.

## 2019-11-06 ENCOUNTER — Encounter: Payer: Self-pay | Admitting: Hematology

## 2019-11-08 ENCOUNTER — Telehealth: Payer: Self-pay | Admitting: *Deleted

## 2019-11-08 NOTE — Telephone Encounter (Signed)
Per Dr.Kale, pt is scheduled for eye surgery on 9/8. Ophthalmolgist needs PLT close to 100k for surgery. She will need to be scheduled for labs, MD visit and platelet transfusion on 11/21/2019. Message sent to scheduling

## 2019-11-10 ENCOUNTER — Other Ambulatory Visit: Payer: Self-pay | Admitting: *Deleted

## 2019-11-10 DIAGNOSIS — C8300 Small cell B-cell lymphoma, unspecified site: Secondary | ICD-10-CM

## 2019-11-13 ENCOUNTER — Other Ambulatory Visit: Payer: Self-pay | Admitting: Hematology

## 2019-11-13 DIAGNOSIS — D696 Thrombocytopenia, unspecified: Secondary | ICD-10-CM

## 2019-11-13 DIAGNOSIS — C8300 Small cell B-cell lymphoma, unspecified site: Secondary | ICD-10-CM

## 2019-11-13 NOTE — Progress Notes (Signed)
High priority appointment message sent to setup Labs, MD visit and PLT transfusion - 1 unit on 9/7 in preparation for eye surgery on 11/22/2019  .[place]

## 2019-11-15 ENCOUNTER — Telehealth: Payer: Self-pay | Admitting: Hematology

## 2019-11-15 NOTE — Telephone Encounter (Signed)
Scheduled appt per 8/30 sch msg - pt is aware of appt on 9/7

## 2019-11-20 NOTE — Progress Notes (Signed)
HEMATOLOGY/ONCOLOGY CLINIC NOTE  Date of Service: 11/21/2019  Patient Care Team: [name], MD as PCP - General (Internal Medicine)  CHIEF COMPLAINTS/PURPOSE OF CONSULTATION:  Chronic Lymphocytic Leukemia  Oncologic History:  Oncology History  Breast cancer of upper-outer quadrant of left female breast ([place])  09/16/2010 Surgery   Left breast 1.5 cm invasive ductal carcinoma with calcifications, grade 1, perineural invasion was seen, DCIS, margins negative, 2 sentinel nodes negative, ER 99%, PR negative, Ki-67 79%, HER-2 negative, Oncotype 24, 16% ROR   10/25/2010 -  Anti-estrogen oral therapy   Arimidex 1 mg daily developed side effects and was held briefly and restarted   Lymphoma, small lymphocytic ([place])  11/16/2014 Initial Diagnosis   Lymphoma, small lymphocytic: Based on cervical lymph node biopsy CD5 positive CD10 negative     HISTORY OF PRESENTING ILLNESS:   Brianna York is a wonderful 85 y.o. female who has been referred to [place] by my colleague Dr. Nicholas Lose for evaluation and management of Chronic Lymphocytic Leukemia. The pt reports that she is doing well overall.  The pt was initially diagnosed with SLL/CLL in 2016 after a cervical lymph node biopsy. The pt reports that she "wears out easy, a little tired, otherwise active." She notes that she takes a nap in the afternoon but notes this could be attributable to her age. She denies her fatigue interrupting her daily life. The pt notes that she has lost about 10 pounds over the last 4 years. She notes that her appetite "has probably changed some," and notes that she eats less, noting she "fills up faster." The pt denies any fevers, chills, or night sweats. She continues to enjoy eating and is not food averse.   The pt notes that she senses some lumps under her neck and on either side of her neck, which have grown gradually and do not bother her. She denies SOB, abdominal fullness or pains, and denies leg swelling.  However, she has recently been taking antibiotics for H.pylori. She denies skin rashes.  She notes that she is unsure if she is continuing to take Anastrozole, which she began in 2012. She notes that she takes care of herself in her home. Her brother lives down the street and checks in on her every now and then. She lives in her own home at this time and endorses being able to function well.  Most recent lab results (05/23/18) of CBC w/diff and CMP is as follows: all values are WNL except for WBC at 32k, PLT at 112k, Lymphs abs at 27.4k, Monocytes abs at 2.1k, GFR at 53. 05/23/18 LDH at 444  On review of systems, pt reports gradual neck lumps, functioning well, mostly stable energy levels with some occasional tiredness, chronic back pain, and denies fevers, chills, night sweats, new fatigue, unexpected weight loss, armpit or groin swelling, CP, changes in breathing, SOB, abdominal pains, abdominal fullness, upper abdominal pains, leg swelling, calf swelling, skin rashes, and any other symptoms.  On PMHx the pt reports breast cancer diagnosed in 2012, CLL/SLL. On Family Hx the pt reports Father died of acute leukemia.  Interval History:   NASHEA CHUMNEY returns today for management and evaluation of her CLL. The patient's last visit with [place] was on 07/17/2019. The pt reports that she is doing well overall.  The pt reports that she was told that she had a bacterial infection in her gut by a Gastroenterologist. Pt has previously been given antibiotics to treat. She does not remember  the last time that she saw the GI for follow-up. She continues having urgent bowel movements.  Pt notes that she has a large support system in [place], but is still living independently. Her daughter, that lives in [place], helps her keep track of medical appointments. She began to have problems with her short-term memory after a recent fall, but is not interested in additional care at home at this time.   Lab results  today (11/21/19) of CBC w/diff and CMP is as follows: all values are WNL except for WBC at 34.6K, RBC at 3.50, Hgb at 11.0, HCT at 34.3, PLT at 87K, Lymphs Abs at 27.3K, Mono Abs at 4.8K, WBC Morphology shows "Variant Lymphs", Abs Immature Granulocytes at 0.09K, Smudge Cells are "Present".  On review of systems, pt reports vision changes, bowel urgency, memory issues and denies abdominal pain, fevers, chills and any other symptoms.   MEDICAL HISTORY:  Past Medical History:  Diagnosis Date  . Adenomatous colon polyp    tubular  . Arthritis    osteo arthritis  . Breast cancer ([place]) 02/16/2011  . Diverticulosis   . GERD (gastroesophageal reflux disease)   . Hyperlipidemia   . Leukemia ([place])   . Lymphoma ([place])    small lympocytic  . Osteopenia     SURGICAL HISTORY: Past Surgical History:  Procedure Laterality Date  . bartholian cyst  1986  . BREAST LUMPECTOMY Left 09-16-2010  . CATARACT EXTRACTION, BILATERAL    . [place]  . KNEE ARTHROSCOPY Right 2010  . LYMPH GLAND EXCISION Right 11/16/2014   Procedure: EXCISION AND BIOPSY OF RIGHT CERVICAL LYMPH NODE;  Surgeon: Jackolyn Confer, MD;  Location: [facility];  Service: General;  Laterality: Right;  . TONSILLECTOMY  1940    SOCIAL HISTORY: Social History   Socioeconomic History  . Marital status: Widowed    Spouse name: Not on file  . Number of children: 5  . Years of education: Not on file  . Highest education level: Not on file  Occupational History  . Occupation: retired  Tobacco Use  . Smoking status: Former Smoker    Types: Cigarettes    Quit date: 1989    Years since quitting: 32.7  . Smokeless tobacco: Never Used  . Tobacco comment: Quit smoking 27 years ago  Substance and Sexual Activity  . Alcohol use: Yes    Comment: occasional glass wine  . Drug use: No  . Sexual activity: Not Currently  Other Topics Concern  . Not on file  Social History Narrative  . Not on file    Social Determinants of Health   Financial Resource Strain:   . Difficulty of Paying Living Expenses: Not on file  Food Insecurity:   . Worried About Charity fundraiser in the Last Year: Not on file  . Ran Out of Food in the Last Year: Not on file  Transportation Needs:   . Lack of Transportation (Medical): Not on file  . Lack of Transportation (Non-Medical): Not on file  Physical Activity:   . Days of Exercise per Week: Not on file  . Minutes of Exercise per Session: Not on file  Stress:   . Feeling of Stress : Not on file  Social Connections:   . Frequency of Communication with Friends and Family: Not on file  . Frequency of Social Gatherings with Friends and Family: Not on file  . Attends Religious Services: Not on file  . Active Member of  Clubs or Organizations: Not on file  . Attends Archivist Meetings: Not on file  . Marital Status: Not on file  Intimate Partner Violence:   . Fear of Current or Ex-Partner: Not on file  . Emotionally Abused: Not on file  . Physically Abused: Not on file  . Sexually Abused: Not on file    FAMILY HISTORY: Family History  Problem Relation Age of Onset  . Colon cancer Mother 26  . Colon cancer Maternal Aunt        had colon sugery  . Leukemia Father   . Diabetes Brother   . Diabetes Brother     ALLERGIES:  is allergic to ciprofloxacin, loratadine-pseudoephedrine er, penicillins, and tolterodine tartrate.  MEDICATIONS:  Current Outpatient Medications  Medication Sig Dispense Refill  . alendronate (FOSAMAX) 70 MG tablet Take 70 mg by mouth once a week. Take with a full glass of water on an empty stomach.    Marland Kitchen atorvastatin (LIPITOR) 10 MG tablet Take 10 mg by mouth daily.    . Vitamin D, Ergocalciferol, (DRISDOL) 50000 UNITS CAPS capsule Take 50,000 Units by mouth every 7 (seven) days.     No current facility-administered medications for this visit.    REVIEW OF SYSTEMS:   A 10+ POINT REVIEW OF SYSTEMS WAS OBTAINED  including neurology, dermatology, psychiatry, cardiac, respiratory, lymph, extremities, GI, GU, Musculoskeletal, constitutional, breasts, reproductive, HEENT.  All pertinent positives are noted in the HPI.  All others are negative.   PHYSICAL EXAMINATION: ECOG PERFORMANCE STATUS: 2 - Symptomatic, <50% confined to bed  . Vitals:   11/21/19 1156  BP: (!) 143/54  Pulse: 78  Resp: 18  Temp: 97.9 F (36.6 C)  SpO2: 100%   Filed Weights   11/21/19 1156  Weight: 114 lb 12.8 oz (52.1 kg)   .Body mass index is 24.84 kg/m.  Exam was given in a chair   GENERAL:alert, in no acute distress and comfortable SKIN: no acute rashes, no significant lesions EYES: conjunctiva are pink and non-injected, sclera anicteric OROPHARYNX: MMM, no exudates, no oropharyngeal erythema or ulceration NECK: supple, no JVD LYMPH:  no palpable lymphadenopathy in the axillary or inguinal regions. Few enlarged lymph nodes in cervical region. LUNGS: clear to auscultation b/l with normal respiratory effort HEART: regular rate & rhythm ABDOMEN:  normoactive bowel sounds , non tender, not distended. No palpable hepatosplenomegaly.  Extremity: no pedal edema PSYCH: alert & oriented x 3 with fluent speech NEURO: no focal motor/sensory deficits  LABORATORY DATA:  I have reviewed the data as listed  . CBC Latest Ref Rng & Units 11/21/2019 07/17/2019 01/31/2019  WBC 4.0 - 10.5 K/uL 34.6(H) 34.9(H) 26.0(H)  Hemoglobin 12.0 - 15.0 g/dL 11.0(L) 11.9(L) 12.2  Hematocrit 36 - 46 % 34.3(L) 37.8 37.4  Platelets 150 - 400 K/uL 87(L) 84(L) 83(L)    . CMP Latest Ref Rng & Units 11/21/2019 07/17/2019 01/31/2019  Glucose 70 - 99 mg/dL 89 86 79  BUN 8 - 23 mg/dL 17 12 12   Creatinine 0.44 - 1.00 mg/dL 0.87 0.95 1.07(H)  Sodium 135 - 145 mmol/L 139 143 142  Potassium 3.5 - 5.1 mmol/L 4.3 4.7 4.6  Chloride 98 - 111 mmol/L 110 108 106  CO2 22 - 32 mmol/L 22 26 25   Calcium 8.9 - 10.3 mg/dL 9.0 8.9 9.0  Total Protein 6.5 - 8.1 g/dL  6.6 6.5 6.6  Total Bilirubin 0.3 - 1.2 mg/dL 0.4 0.4 0.4  Alkaline Phos 38 - 126 U/L 58 56 66  AST 15 - 41 U/L 26 28 23   ALT 0 - 44 U/L 9 11 11    . Lab Results  Component Value Date   LDH 498 (H) 07/17/2019     02/21/18 Left Lacrimal sac biopsy:    12/18/14 Cytogenetics:    12/04/14 Right cervical lymph node biopsy:   06/27/18 FISH CLL Prognostic Panel:    RADIOGRAPHIC STUDIES: I have personally reviewed the radiological images as listed and agreed with the findings in the report. No results found.  ASSESSMENT & PLAN:   85 y.o. female with  1. Chronic Lymphocytic Leukemia 11/16/14 Right cervical lymph node biopsy revealed SLL/CLL 12/18/14 Cytogenetics revealed 59% of cells with gain of CEP12 and 5% of cells with loss of ATM 02/21/18 Right lacrimal gland revealed lymphoid infiltrates 06/27/18 FISH CLL Prognostic Panel revealed 49.33% of cells with Trisomy 12, and only 2% of cells with 17p mutation  08/03/18 PET/CT revealed "Exam positive for FDG avid adenopathy within the neck, chest, abdomen and pelvis compatible with the clinical history of chronic lymphocytic leukemia. 2. Splenomegaly (about 13cm) with mild increased radiotracer uptake throughout the spleen (above blood pool activity)".   2. History of Invasive Ductal Carcinoma 09/16/10 Left breast 1.5cm IDC with calcifications, grade 1, perineural invasion. DCIS margins negative, 2 sentinel nodes negative, ER 99%, PR negative, Ki-67 at 79%, HER-2 negative, Oncotype 24, 16% ROR Continues on 26m Anastrozole daily, began on 10/25/10. Last available Mammogram on 12/31/16 negative  PLAN:  -Discussed pt labwork today, 11/21/19; anemia is somewhat progressive, WBC & PLT are stable, blood chemistries are nml -The pt shows no lab or clinical evidence of significant CLL progression of at this time. -No indication to begin treatment at this time. Will continue watchful observation. -Advised pt that it is uncommon to have CLL  involvement in the lacrimal glands but cannot be ruled out. -Advised pt that we would give her a PLT transfusion today to reduce risk of bleeding after surgery.  -Recommend pt f/u with GI to confirm absence of H. Pylori infection -Will see back in 4 months with labs   FOLLOW UP: PLT transfusion as scheduled today. RTC with Dr KIrene Limbowith labs in 4 months   The total time spent in the appt was 30 minutes and more than 50% was on counseling and direct patient cares and co-ordination of cares with ophthamologist  All of the patient's questions were answered with apparent satisfaction. The patient knows to call the clinic with any problems, questions or concerns.    GSullivan LoneMD MCulbertsonAAHIVMS SUropartners Surgery Center LLCCOwensboro Health Muhlenberg Community HospitalHematology/Oncology Physician CBeltway Surgery Centers LLC (Office):       [phone](Work cell):  [phone](Fax):           [phone] 11/21/2019 12:44 PM  I, JYevette Edwards am acting as a scribe for Dr. GSullivan Lone   .I have reviewed the above documentation for accuracy and completeness, and I agree with the above. .Brunetta GeneraMD

## 2019-11-21 ENCOUNTER — Other Ambulatory Visit: Payer: Self-pay | Admitting: *Deleted

## 2019-11-21 ENCOUNTER — Inpatient Hospital Stay: Payer: Medicare HMO

## 2019-11-21 ENCOUNTER — Other Ambulatory Visit: Payer: Self-pay

## 2019-11-21 ENCOUNTER — Inpatient Hospital Stay: Payer: Medicare HMO | Attending: Hematology | Admitting: Hematology

## 2019-11-21 VITALS — BP 143/54 | HR 78 | Temp 97.9°F | Resp 18 | Ht <= 58 in | Wt 114.8 lb

## 2019-11-21 DIAGNOSIS — C9110 Chronic lymphocytic leukemia of B-cell type not having achieved remission: Secondary | ICD-10-CM

## 2019-11-21 DIAGNOSIS — Z800 Family history of malignant neoplasm of digestive organs: Secondary | ICD-10-CM | POA: Diagnosis not present

## 2019-11-21 DIAGNOSIS — Z833 Family history of diabetes mellitus: Secondary | ICD-10-CM | POA: Insufficient documentation

## 2019-11-21 DIAGNOSIS — Z8572 Personal history of non-Hodgkin lymphomas: Secondary | ICD-10-CM | POA: Diagnosis not present

## 2019-11-21 DIAGNOSIS — D696 Thrombocytopenia, unspecified: Secondary | ICD-10-CM

## 2019-11-21 DIAGNOSIS — Z79899 Other long term (current) drug therapy: Secondary | ICD-10-CM | POA: Insufficient documentation

## 2019-11-21 DIAGNOSIS — Z853 Personal history of malignant neoplasm of breast: Secondary | ICD-10-CM | POA: Insufficient documentation

## 2019-11-21 DIAGNOSIS — M8580 Other specified disorders of bone density and structure, unspecified site: Secondary | ICD-10-CM | POA: Diagnosis not present

## 2019-11-21 DIAGNOSIS — Z806 Family history of leukemia: Secondary | ICD-10-CM | POA: Diagnosis not present

## 2019-11-21 DIAGNOSIS — C8300 Small cell B-cell lymphoma, unspecified site: Secondary | ICD-10-CM

## 2019-11-21 DIAGNOSIS — Z87891 Personal history of nicotine dependence: Secondary | ICD-10-CM | POA: Diagnosis not present

## 2019-11-21 LAB — CBC WITH DIFFERENTIAL (CANCER CENTER ONLY)
Abs Immature Granulocytes: 0.09 10*3/uL — ABNORMAL HIGH (ref 0.00–0.07)
Basophils Absolute: 0.1 10*3/uL (ref 0.0–0.1)
Basophils Relative: 0 %
Eosinophils Absolute: 0.1 10*3/uL (ref 0.0–0.5)
Eosinophils Relative: 0 %
HCT: 34.3 % — ABNORMAL LOW (ref 36.0–46.0)
Hemoglobin: 11 g/dL — ABNORMAL LOW (ref 12.0–15.0)
Immature Granulocytes: 0 %
Lymphocytes Relative: 80 %
Lymphs Abs: 27.3 10*3/uL — ABNORMAL HIGH (ref 0.7–4.0)
MCH: 31.4 pg (ref 26.0–34.0)
MCHC: 32.1 g/dL (ref 30.0–36.0)
MCV: 98 fL (ref 80.0–100.0)
Monocytes Absolute: 4.8 10*3/uL — ABNORMAL HIGH (ref 0.1–1.0)
Monocytes Relative: 14 %
Neutro Abs: 2.2 10*3/uL (ref 1.7–7.7)
Neutrophils Relative %: 6 %
Platelet Count: 87 10*3/uL — ABNORMAL LOW (ref 150–400)
RBC: 3.5 MIL/uL — ABNORMAL LOW (ref 3.87–5.11)
RDW: 14.5 % (ref 11.5–15.5)
WBC Count: 34.6 10*3/uL — ABNORMAL HIGH (ref 4.0–10.5)
nRBC: 0.1 % (ref 0.0–0.2)

## 2019-11-21 LAB — CMP (CANCER CENTER ONLY)
ALT: 9 U/L (ref 0–44)
AST: 26 U/L (ref 15–41)
Albumin: 3.6 g/dL (ref 3.5–5.0)
Alkaline Phosphatase: 58 U/L (ref 38–126)
Anion gap: 7 (ref 5–15)
BUN: 17 mg/dL (ref 8–23)
CO2: 22 mmol/L (ref 22–32)
Calcium: 9 mg/dL (ref 8.9–10.3)
Chloride: 110 mmol/L (ref 98–111)
Creatinine: 0.87 mg/dL (ref 0.44–1.00)
GFR, Est AFR Am: >60 mL/min (ref >60)
GFR, Estimated: >60 mL/min (ref >60)
Glucose, Bld: 89 mg/dL (ref 70–99)
Potassium: 4.3 mmol/L (ref 3.5–5.1)
Sodium: 139 mmol/L (ref 135–145)
Total Bilirubin: 0.4 mg/dL (ref 0.3–1.2)
Total Protein: 6.6 g/dL (ref 6.5–8.1)

## 2019-11-21 LAB — TYPE AND SCREEN
ABO/RH(D): A POS
Antibody Screen: NEGATIVE

## 2019-11-21 LAB — ABO/RH: ABO/RH(D): A POS

## 2019-11-21 MED ORDER — SODIUM CHLORIDE 0.9% IV SOLUTION
250.0000 mL | Freq: Once | INTRAVENOUS | Status: AC
  Administered 2019-11-21: 250 mL via INTRAVENOUS
  Filled 2019-11-21: qty 250

## 2019-11-21 MED ORDER — METHYLPREDNISOLONE SODIUM SUCC 40 MG IJ SOLR
INTRAMUSCULAR | Status: AC
  Filled 2019-11-21: qty 1

## 2019-11-21 MED ORDER — METHYLPREDNISOLONE SODIUM SUCC 40 MG IJ SOLR
40.0000 mg | Freq: Once | INTRAMUSCULAR | Status: AC
  Administered 2019-11-21: 40 mg via INTRAVENOUS

## 2019-11-21 MED ORDER — ACETAMINOPHEN 325 MG PO TABS
650.0000 mg | ORAL_TABLET | Freq: Once | ORAL | Status: AC
  Administered 2019-11-21: 650 mg via ORAL

## 2019-11-21 MED ORDER — ACETAMINOPHEN 325 MG PO TABS
ORAL_TABLET | ORAL | Status: AC
  Filled 2019-11-21: qty 2

## 2019-11-22 ENCOUNTER — Other Ambulatory Visit: Payer: Self-pay

## 2019-11-22 LAB — BPAM PLATELET PHERESIS
Blood Product Expiration Date: 202109102359
ISSUE DATE / TIME: 202109071444
Unit Type and Rh: 6200

## 2019-11-22 LAB — PREPARE PLATELET PHERESIS: Unit division: 0

## 2019-11-23 ENCOUNTER — Other Ambulatory Visit (HOSPITAL_COMMUNITY)
Admission: RE | Admit: 2019-11-23 | Discharge: 2019-11-23 | Disposition: A | Discharge status: Home / Self Care | Payer: Medicare HMO | Source: Ambulatory Visit | Attending: Ophthalmology | Admitting: Ophthalmology

## 2019-11-27 LAB — SURGICAL PATHOLOGY

## 2019-12-15 ENCOUNTER — Other Ambulatory Visit: Payer: Self-pay

## 2019-12-15 ENCOUNTER — Inpatient Hospital Stay: Payer: Medicare HMO | Attending: Hematology | Admitting: Hematology

## 2019-12-15 ENCOUNTER — Inpatient Hospital Stay: Payer: Medicare HMO

## 2019-12-15 VITALS — BP 130/61 | HR 77 | Temp 97.8°F | Resp 18 | Ht <= 58 in | Wt 115.1 lb

## 2019-12-15 DIAGNOSIS — C9110 Chronic lymphocytic leukemia of B-cell type not having achieved remission: Secondary | ICD-10-CM

## 2019-12-15 LAB — CBC WITH DIFFERENTIAL (CANCER CENTER ONLY)
Abs Immature Granulocytes: 0 10*3/uL (ref 0.00–0.07)
Basophils Absolute: 0 10*3/uL (ref 0.0–0.1)
Basophils Relative: 0 %
Eosinophils Absolute: 0 10*3/uL (ref 0.0–0.5)
Eosinophils Relative: 0 %
HCT: 35 % — ABNORMAL LOW (ref 36.0–46.0)
Hemoglobin: 11.3 g/dL — ABNORMAL LOW (ref 12.0–15.0)
Lymphocytes Relative: 94 %
Lymphs Abs: 31.1 10*3/uL — ABNORMAL HIGH (ref 0.7–4.0)
MCH: 31.7 pg (ref 26.0–34.0)
MCHC: 32.3 g/dL (ref 30.0–36.0)
MCV: 98.3 fL (ref 80.0–100.0)
Monocytes Absolute: 0.7 10*3/uL (ref 0.1–1.0)
Monocytes Relative: 2 %
Neutro Abs: 1.3 10*3/uL — ABNORMAL LOW (ref 1.7–7.7)
Neutrophils Relative %: 4 %
Platelet Count: 81 10*3/uL — ABNORMAL LOW (ref 150–400)
RBC: 3.56 MIL/uL — ABNORMAL LOW (ref 3.87–5.11)
RDW: 14.4 % (ref 11.5–15.5)
WBC Count: 33.1 10*3/uL — ABNORMAL HIGH (ref 4.0–10.5)
nRBC: 0 % (ref 0.0–0.2)

## 2019-12-15 LAB — CMP (CANCER CENTER ONLY)
ALT: 11 U/L (ref 0–44)
AST: 31 U/L (ref 15–41)
Albumin: 3.6 g/dL (ref 3.5–5.0)
Alkaline Phosphatase: 65 U/L (ref 38–126)
Anion gap: 6 (ref 5–15)
BUN: 13 mg/dL (ref 8–23)
CO2: 25 mmol/L (ref 22–32)
Calcium: 9.1 mg/dL (ref 8.9–10.3)
Chloride: 107 mmol/L (ref 98–111)
Creatinine: 0.97 mg/dL (ref 0.44–1.00)
GFR, Est AFR Am: >60 mL/min (ref >60)
GFR, Estimated: 54 mL/min — ABNORMAL LOW (ref >60)
Glucose, Bld: 84 mg/dL (ref 70–99)
Potassium: 4.5 mmol/L (ref 3.5–5.1)
Sodium: 138 mmol/L (ref 135–145)
Total Bilirubin: 0.4 mg/dL (ref 0.3–1.2)
Total Protein: 6.6 g/dL (ref 6.5–8.1)

## 2019-12-15 LAB — LACTATE DEHYDROGENASE: LDH: 612 U/L — ABNORMAL HIGH (ref 98–192)

## 2019-12-15 LAB — SAMPLE TO BLOOD BANK

## 2019-12-15 NOTE — Progress Notes (Signed)
HEMATOLOGY/ONCOLOGY CLINIC NOTE  Date of Service: 12/15/2019  Patient Care Team: [name], MD as PCP - General (Internal Medicine)  CHIEF COMPLAINTS/PURPOSE OF CONSULTATION:  Chronic Lymphocytic Leukemia  Oncologic History:  Oncology History  Breast cancer of upper-outer quadrant of left female breast ([place])  09/16/2010 Surgery   Left breast 1.5 cm invasive ductal carcinoma with calcifications, grade 1, perineural invasion was seen, DCIS, margins negative, 2 sentinel nodes negative, ER 99%, PR negative, Ki-67 79%, HER-2 negative, Oncotype 24, 16% ROR   10/25/2010 -  Anti-estrogen oral therapy   Arimidex 1 mg daily developed side effects and was held briefly and restarted   Lymphoma, small lymphocytic ([place])  11/16/2014 Initial Diagnosis   Lymphoma, small lymphocytic: Based on cervical lymph node biopsy CD5 positive CD10 negative     HISTORY OF PRESENTING ILLNESS:   Brianna York is a wonderful 85 y.o. female who has been referred to [place] by my colleague Dr. Nicholas Lose for evaluation and management of Chronic Lymphocytic Leukemia. The pt reports that she is doing well overall.  The pt was initially diagnosed with SLL/CLL in 2016 after a cervical lymph node biopsy. The pt reports that she "wears out easy, a little tired, otherwise active." She notes that she takes a nap in the afternoon but notes this could be attributable to her age. She denies her fatigue interrupting her daily life. The pt notes that she has lost about 10 pounds over the last 4 years. She notes that her appetite "has probably changed some," and notes that she eats less, noting she "fills up faster." The pt denies any fevers, chills, or night sweats. She continues to enjoy eating and is not food averse.   The pt notes that she senses some lumps under her neck and on either side of her neck, which have grown gradually and do not bother her. She denies SOB, abdominal fullness or pains, and denies leg swelling.  However, she has recently been taking antibiotics for H.pylori. She denies skin rashes.  She notes that she is unsure if she is continuing to take Anastrozole, which she began in 2012. She notes that she takes care of herself in her home. Her brother lives down the street and checks in on her every now and then. She lives in her own home at this time and endorses being able to function well.  Most recent lab results (05/23/18) of CBC w/diff and CMP is as follows: all values are WNL except for WBC at 32k, PLT at 112k, Lymphs abs at 27.4k, Monocytes abs at 2.1k, GFR at 53. 05/23/18 LDH at 444  On review of systems, pt reports gradual neck lumps, functioning well, mostly stable energy levels with some occasional tiredness, chronic back pain, and denies fevers, chills, night sweats, new fatigue, unexpected weight loss, armpit or groin swelling, CP, changes in breathing, SOB, abdominal pains, abdominal fullness, upper abdominal pains, leg swelling, calf swelling, skin rashes, and any other symptoms.  On PMHx the pt reports breast cancer diagnosed in 2012, CLL/SLL. On Family Hx the pt reports Father died of acute leukemia.  Interval History:   Brianna York returns today for management and evaluation of her CLL. The patient's last visit with [place] was on 11/21/2019. The pt reports that she is doing well overall.   The pt reports she tolerated the eye surgery well and has healed well. Surgical pathology from her right orbital mass was consistent with CLL. No issues with bleeding or abnormal  bruising.  No fevers no chills no night sweats no unexpected weight loss.  Patient's son was on the phone.  We discussed all the findings and the current status of her CLL overall.  We discussed potential treatment approaches including treating the CLL systemically versus holding off on systemic treatment for CLL and consulting radiation oncology for consideration of orbital radiation.  Patient is keen to hold off on  CLL specific systemic therapies at this time and focus on addressing her ocular issues if possible with radiation therapy.  MEDICAL HISTORY:  Past Medical History:  Diagnosis Date  . Adenomatous colon polyp    tubular  . Arthritis    osteo arthritis  . Breast cancer ([place]) 02/16/2011  . Diverticulosis   . GERD (gastroesophageal reflux disease)   . Hyperlipidemia   . Leukemia ([place])   . Lymphoma ([place])    small lympocytic  . Osteopenia     SURGICAL HISTORY: Past Surgical History:  Procedure Laterality Date  . bartholian cyst  1986  . BREAST LUMPECTOMY Left 09-16-2010  . CATARACT EXTRACTION, BILATERAL    . [facility]  . KNEE ARTHROSCOPY Right 2010  . LYMPH GLAND EXCISION Right 11/16/2014   Procedure: EXCISION AND BIOPSY OF RIGHT CERVICAL LYMPH NODE;  Surgeon: Jackolyn Confer, MD;  Location: [facility];  Service: General;  Laterality: Right;  . TONSILLECTOMY  1940    SOCIAL HISTORY: Social History   Socioeconomic History  . Marital status: Widowed    Spouse name: Not on file  . Number of children: 5  . Years of education: Not on file  . Highest education level: Not on file  Occupational History  . Occupation: retired  Tobacco Use  . Smoking status: Former Smoker    Types: Cigarettes    Quit date: 1989    Years since quitting: 32.7  . Smokeless tobacco: Never Used  . Tobacco comment: Quit smoking 27 years ago  Substance and Sexual Activity  . Alcohol use: Yes    Comment: occasional glass wine  . Drug use: No  . Sexual activity: Not Currently  Other Topics Concern  . Not on file  Social History Narrative  . Not on file   Social Determinants of Health   Financial Resource Strain:   . Difficulty of Paying Living Expenses: Not on file  Food Insecurity:   . Worried About Charity fundraiser in the Last Year: Not on file  . Ran Out of Food in the Last Year: Not on file  Transportation Needs:   . Lack of Transportation (Medical):  Not on file  . Lack of Transportation (Non-Medical): Not on file  Physical Activity:   . Days of Exercise per Week: Not on file  . Minutes of Exercise per Session: Not on file  Stress:   . Feeling of Stress : Not on file  Social Connections:   . Frequency of Communication with Friends and Family: Not on file  . Frequency of Social Gatherings with Friends and Family: Not on file  . Attends Religious Services: Not on file  . Active Member of Clubs or Organizations: Not on file  . Attends Archivist Meetings: Not on file  . Marital Status: Not on file  Intimate Partner Violence:   . Fear of Current or Ex-Partner: Not on file  . Emotionally Abused: Not on file  . Physically Abused: Not on file  . Sexually Abused: Not on file    FAMILY  HISTORY: Family History  Problem Relation Age of Onset  . Colon cancer Mother 66  . Colon cancer Maternal Aunt        had colon sugery  . Leukemia Father   . Diabetes Brother   . Diabetes Brother     ALLERGIES:  is allergic to ciprofloxacin, loratadine-pseudoephedrine er, penicillins, and tolterodine tartrate.  MEDICATIONS:  Current Outpatient Medications  Medication Sig Dispense Refill  . alendronate (FOSAMAX) 70 MG tablet Take 70 mg by mouth once a week. Take with a full glass of water on an empty stomach.    Marland Kitchen atorvastatin (LIPITOR) 10 MG tablet Take 10 mg by mouth daily.    . Vitamin D, Ergocalciferol, (DRISDOL) 50000 UNITS CAPS capsule Take 50,000 Units by mouth every 7 (seven) days.     No current facility-administered medications for this visit.    REVIEW OF SYSTEMS:   A 10+ POINT REVIEW OF SYSTEMS WAS OBTAINED including neurology, dermatology, psychiatry, cardiac, respiratory, lymph, extremities, GI, GU, Musculoskeletal, constitutional, breasts, reproductive, HEENT.  All pertinent positives are noted in the HPI.  All others are negative.   PHYSICAL EXAMINATION: ECOG PERFORMANCE STATUS: 2 - Symptomatic, <50% confined to  bed  . Vitals:   12/15/19 1252  BP: 130/61  Pulse: 77  Resp: 18  Temp: 97.8 F (36.6 C)  SpO2: 100%   Filed Weights   12/15/19 1252  Weight: 115 lb 1.6 oz (52.2 kg)   .Body mass index is 24.91 kg/m.  NAD GENERAL:alert, in no acute distress and comfortable SKIN: no acute rashes, no significant lesions EYES: Bilateral eyelids puffy OROPHARYNX: MMM, no exudates, no oropharyngeal erythema or ulceration NECK: supple, no JVD LYMPH: Few enlarged lymph nodes in cervical region. LUNGS: clear to auscultation b/l with normal respiratory effort HEART: regular rate & rhythm ABDOMEN:  normoactive bowel sounds , non tender, not distended. No palpable hepatosplenomegaly.  Extremity: no pedal edema PSYCH: alert & oriented x 3 with fluent speech NEURO: no focal motor/sensory deficits  LABORATORY DATA:  I have reviewed the data as listed  . CBC Latest Ref Rng & Units 12/15/2019 11/21/2019 07/17/2019  WBC 4.0 - 10.5 K/uL 33.1(H) 34.6(H) 34.9(H)  Hemoglobin 12.0 - 15.0 g/dL 11.3(L) 11.0(L) 11.9(L)  Hematocrit 36 - 46 % 35.0(L) 34.3(L) 37.8  Platelets 150 - 400 K/uL 81(L) 87(L) 84(L)    . CMP Latest Ref Rng & Units 12/15/2019 11/21/2019 07/17/2019  Glucose 70 - 99 mg/dL 84 89 86  BUN 8 - 23 mg/dL 13 17 12   Creatinine 0.44 - 1.00 mg/dL 0.97 0.87 0.95  Sodium 135 - 145 mmol/L 138 139 143  Potassium 3.5 - 5.1 mmol/L 4.5 4.3 4.7  Chloride 98 - 111 mmol/L 107 110 108  CO2 22 - 32 mmol/L 25 22 26   Calcium 8.9 - 10.3 mg/dL 9.1 9.0 8.9  Total Protein 6.5 - 8.1 g/dL 6.6 6.6 6.5  Total Bilirubin 0.3 - 1.2 mg/dL 0.4 0.4 0.4  Alkaline Phos 38 - 126 U/L 65 58 56  AST 15 - 41 U/L 31 26 28   ALT 0 - 44 U/L 11 9 11    . Lab Results  Component Value Date   LDH 612 (H) 12/15/2019     Surgical Pathology  CASE: [id number]  PATIENT: Annisa Bodkin  Flow Pathology Report      Clinical history: right orbital mass   DIAGNOSIS:   - Monoclonal B-cell population identified, see comment.    COMMENT:   There is a monoclonal population of B-cells with  expression of CD5 and  CD200. The phenotype is consistent with the patient's history of small  lymphocytic lymphoma.   02/21/18 Left Lacrimal sac biopsy:    12/18/14 Cytogenetics:    12/04/14 Right cervical lymph node biopsy:   06/27/18 FISH CLL Prognostic Panel:    RADIOGRAPHIC STUDIES: I have personally reviewed the radiological images as listed and agreed with the findings in the report. No results found.  ASSESSMENT & PLAN:   85 y.o. female with  1. Chronic Lymphocytic Leukemia 11/16/14 Right cervical lymph node biopsy revealed SLL/CLL 12/18/14 Cytogenetics revealed 59% of cells with gain of CEP12 and 5% of cells with loss of ATM 02/21/18 Right lacrimal gland revealed lymphoid infiltrates 06/27/18 FISH CLL Prognostic Panel revealed 49.33% of cells with Trisomy 12, and only 2% of cells with 17p mutation  08/03/18 PET/CT revealed "Exam positive for FDG avid adenopathy within the neck, chest, abdomen and pelvis compatible with the clinical history of chronic lymphocytic leukemia. 2. Splenomegaly (about 13cm) with mild increased radiotracer uptake throughout the spleen (above blood pool activity)".   2. History of Invasive Ductal Carcinoma 09/16/10 Left breast 1.5cm IDC with calcifications, grade 1, perineural invasion. DCIS margins negative, 2 sentinel nodes negative, ER 99%, PR negative, Ki-67 at 79%, HER-2 negative, Oncotype 24, 16% ROR Continues on 102m Anastrozole daily, began on 10/25/10. Last available Mammogram on 12/31/16 negative  3.  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  Recent right orbital biopsy also showed CLL/SLL.  4.  Thrombocytopenia related to CLL stable platelets in the 80k range. PLAN:  -The pt shows no lab or clinical evidence of significant CLL progression of at this time.  She has had some chronic thrombocytopenia with platelets in the 80k range. -No new constitutional  symptoms. -Does have an elevated LDH but it could be from recent tissue injury related to surgery.  No significant worsening of anemia to suggest hemolysis. -We discussed consideration of systemic steroid therapy in the context of her symptomatic or bilateral involvement by CLL/SLL and stable thrombocytopenia in the 80k range versus focusing on consideration of localized radiation therapy to the bilateral orbits for local CLL control and delaying systemic cellular therapies. -Patient's son was on the phone and helped patient with decision making. -Patient chooses to see radiation oncology to discuss possible RT to treat her document and orbital CLL/SLL involvement  FOLLOW UP: Radiation oncology referral for consideration of b/l orbital RT to address lacrimal and orbital involvement by SLL RTC with Dr KIrene Limboin 3 months with labs   The total time spent in the appt was 30 minutes and more than 50% was on counseling and direct patient cares.  All of the patient's questions were answered with apparent satisfaction. The patient knows to call the clinic with any problems, questions or concerns.    GSullivan LoneMD MRoanokeAAHIVMS SWhittier Rehabilitation Hospital BradfordCCapital Regional Medical Center - Gadsden Memorial CampusHematology/Oncology Physician CTricounty Surgery Center (Office):       [phone](Work cell):  [phone](Fax):           [phone] 12/15/2019 9:13 AM  I, JYevette Edwards am acting as a scribe for Dr. GSullivan Lone   .I have reviewed the above documentation for accuracy and completeness, and I agree with the above. .Brunetta GeneraMD

## 2019-12-20 ENCOUNTER — Encounter: Payer: Self-pay | Admitting: Hematology

## 2019-12-21 ENCOUNTER — Encounter: Payer: Self-pay | Admitting: Radiation Oncology

## 2019-12-21 NOTE — Progress Notes (Signed)
Lymphoma Location(s) / Histology:  Chronic Lymphocytic Leukemia/Small Lymphocytic Lymphoma  Brianna York was initially diagnosed with SLL/CLL in 2016 after a cervical lymph node biopsy. Per Dr. Lindi Adie, patient has noticed weight loss (~20 pounds back in March of this year) as well as fatigue and increase in size of lymphadenopathy in the neck and submandibular areas. Dr. Lindi Adie referred patient to Dr. Irene Limbo for a second opinion, and treatment suggestions vs continued surveillance  Biopsies revealed:  11/22/2019   11/16/2014 Diagnosis Lymph node, biopsy, right cervical - SMALL LYMPHOCYTIC LYMPHOMA. Interpretation Tissue-Flow Cytometry - MONOCLONAL B CELL POPULATION IDENTIFIED. Diagnosis Comment: The phenotypic features are consistent with chronic lymphocytic leukemia/small lymphocytic lymphoma and correlate with the morphology in the lymph node ([id number]). (BNS:kh 11-21-14)  CT Abdomen/Pelvis  08/28/2019 IMPRESSION: 1. No acute intra-abdominal process. Normal appendix. 2. Increased moderate splenomegaly. Abdominal and pelvic lymphadenopathy, stable to minimally increased in size. Findings are consistent with history of CLL. 3. Aortic Atherosclerosis (ICD10-I70.0).  Past/Anticipated interventions by medical oncology, if any:  Under care of Dr. Sullivan Lone 12/15/2019 PLAN:  -The pt shows no lab or clinical evidence of significant CLL progression of at this time.  She has had some chronic thrombocytopenia with platelets in the 80k range. -No new constitutional symptoms. -Does have an elevated LDH but it could be from recent tissue injury related to surgery.  No significant worsening of anemia to suggest hemolysis. -We discussed consideration of systemic steroid therapy in the context of her symptomatic or bilateral involvement by CLL/SLL and stable thrombocytopenia in the 80k range versus focusing on consideration of localized radiation therapy to the bilateral orbits for local CLL  control and delaying systemic cellular therapies. -Patient's son was on the phone and helped patient with decision making. -Patient chooses to see radiation oncology to discuss possible RT to treat her document and orbital CLL/SLL involvement FOLLOW UP: Radiation oncology referral for consideration of b/l orbital RT to address lacrimal and orbital involvement by SLL RTC with Dr Irene Limbo in 3 months with labs Was also under the care of Dr. Nicholas Lose  Malignant neoplasm of upper-outer quadrant of left breast 09/16/2010 Surgery   Left breast 1.5 cm invasive ductal carcinoma with calcifications, grade 1, perineural invasion was seen, DCIS, margins negative, 2 sentinel nodes negative, ER 99%, PR negative, Ki-67 79%, HER-2 negative, Oncotype 24, 16% ROR   10/25/2010 -  Anti-estrogen oral therapy   Arimidex 1 mg daily developed side effects and was held briefly and restarted    Weight changes, if any, over the past 6 months: Patient reports a 30+ pound weight loss over the past 12 months. Reports a healthy appetite and that she's eating well, but continues to loose weight.  Recurrent fevers, or drenching night sweats, if any: Patient denies  SAFETY ISSUES:  Prior radiation? No  Pacemaker/ICD? No  Possible current pregnancy? No--postmenopausal   Is the patient on methotrexate? No  Current Complaints / other details:  Patient reports blurry vision and irritation to her eyes (more prominent in right eye than left). On assessment right eye appears almost completely swollen shut. Patient lives at home by herself. She has a son that is involved but lives in [place], as well as a brother that lives close to her and checks on her frequently (he brings her to all her appointments). She sustained a fall about a year ago and hit hit her head compromising her short term memory. She reports she's currently not taking any daily medications (mainly because she cannot  remember), but is scheduled to see her PCP  next Tuesday 12/26/19 and hopes to address this issue.

## 2019-12-21 NOTE — Progress Notes (Signed)
Oncology Nurse Navigator Documentation  Placed introductory call to new referral patient Brianna York  Introduced myself as the H&N oncology nurse navigator that works with Dr. Isidore Moos to whom she has been referred by Dr. Irene Limbo. She confirmed understanding of referral.  Briefly explained my role as her navigator, provided my contact information.   Confirmed understanding of upcoming appts and [place] location, explained arrival and registration process.  I encouraged her to call with questions/concerns as she moves forward with appts and procedures.    She verbalized understanding of information provided, expressed appreciation for my call.   Navigator Initial Assessment . Employment Status: retired . Currently on FMLA / STD: no . Living Situation: She lives alone. Her brother lives close by.  . Support System: family, son (in [place]) . PCP: [name] MD . PCD: na . Financial Concerns:no . Transportation Needs: no . Sensory Deficits:no . Language Barriers/Interpreter Needed:  no . Ambulation Needs: no . DME Used in Home: no . Psychosocial Needs:  no . Concerns/Needs Understanding Cancer:  addressed/answered by navigator to best of ability . Self-Expressed Needs: no   Harlow Asa RN, BSN, OCN Head & Neck Oncology Nurse [place] [facility] Phone # [phone]  Fax # [phone]

## 2019-12-21 NOTE — Progress Notes (Signed)
Radiation Oncology         [phone] ________________________________  Initial Outpatient Consultation  Name: DYNASTIE KNOOP MRN: [id number]  Date: 12/22/2019  DOB: 1934/07/18  MP:NTIRWE, Elta Guadeloupe, MD  [name], MD   REFERRING PHYSICIAN: [name], MD  DIAGNOSIS:    ICD-10-CM   1. Lymphoma, small lymphocytic (HCC)  C83.00   2. 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  CHIEF COMPLAINT: Here to discuss management of CLL/SLL  HISTORY OF PRESENT ILLNESS::Neidy C Mignogna is a 85 y.o. female who was originally being treated by Dr. Lindi Adie for left breast cancer that was diagnosed in 2012. She had undergone a lumpectomy followed by Anastrozole adjuvant therapy.  In May of 2016, the patient reported a palpable lump at the base of her neck. She underwent a soft tissue neck CT scan on 09/06/2014 that showed an increasing number of cervical lymph nodes bilaterally. The largest right side node was 8 mm and abnormally rounded. Also, there were subclavian neurovascular bundle nodes in the visible upper chest that also appeared increased. The appearance was suspicious for either metastatic nodal disease or lymphoproliferative disorder.  The patient underwent a deep right cervical excisional lymph node biopsy on 11/16/2014 under the care of Dr. Zella Richer. Pathology from the procedure revealed low-grade non-hodgkin's lymphoma, small lymphocytic type. Given that the patient was completely asymptomatic, she remained under watchful monitoring.   Release a couple years she has been coping with issues with her eyes.  She has had problems with her lacrimal glands and she is followed by Dr. Leonard Schwartz of [facility] oculofacial plastic surgery.  It should be noted that on February 21, 2018 a left lacrimal sac biopsy demonstrated small fragments of atypical lymphoid infiltrate consistent with involvement by  previously known small lymphocytic lymphoma.   The patient was referred to Dr. Irene Limbo and was seen in consultation on 06/27/2018. At that time, it was recommended that she proceed with a repeat PET/CT scan in light of LDH elevation and to evaluate lymph node progression. PET scan was performed on 08/02/2018 and showed FDG avid adenopathy within the neck, chest, abdomen, and pelvis that was compatible with history of CLL. It also showed splenomegaly (about 13 cm) with mild increased radiotracer uptake throughout the spleen (above blood pool activity).   On 11/03/2019 she underwent a CT scan of her orbits at an outside center and while I do not have the images, the report notes bilateral enlargement of the lacrimal glands and abnormal tissue in the inferior lateral orbits bilaterally.  Enlarged intraparotid masses bilaterally.  Enlarged upper cervical lymph nodes bilaterally.  She was last seen by Dr. Irene Limbo on 12/15/2019, during which time she was noted to have had some stable thrombocytopenia (stable platelets in the 80K range) related to CLL. There was no lab or clinical evidence of significant CLL progression at that time. However, she did have an elevated LDG, which could have been from a recent tissue injury related to eye surgery on 11/23/2019 (right orbital mass was consistent with CLL). They discussed systemic steroid therapy versus localized radiation therapy to the bilateral orbits for local CLL control and delaying systemic cellular therapies. The patient opted to proceed with possible radiation therapy.  She reports that she is aware of the adenopathy throughout the rest of the head and neck region but it is not significantly affecting her quality of life.  She is bothered by the  orbital/lacrimal disease.  She continues to follow with Dr. Leonard Schwartz for her eye related symptoms.  She has some short-term memory issues following head trauma about a year ago.  She reports blurry vision and eye irritation  bilaterally.  Her son, Wille Glaser, lives in [place] and is present on speaker phone today.   PREVIOUS RADIATION THERAPY: No  PAST MEDICAL HISTORY:  has a past medical history of Adenomatous colon polyp, Arthritis, Breast cancer ([place]) (02/16/2011), Diverticulosis, GERD (gastroesophageal reflux disease), Hyperlipidemia, Leukemia ([place]), Lymphoma ([place]), and Osteopenia.    PAST SURGICAL HISTORY: Past Surgical History:  Procedure Laterality Date  . bartholian cyst  1986  . BREAST LUMPECTOMY Left 09-16-2010  . CATARACT EXTRACTION, BILATERAL    . [place]  . KNEE ARTHROSCOPY Right 2010  . LYMPH GLAND EXCISION Right 11/16/2014   Procedure: EXCISION AND BIOPSY OF RIGHT CERVICAL LYMPH NODE;  Surgeon: Jackolyn Confer, MD;  Location: [facility];  Service: General;  Laterality: Right;  . TONSILLECTOMY  1940    FAMILY HISTORY: family history includes Colon cancer in her maternal aunt; Colon cancer (age of onset: 23) in her mother; Diabetes in her brother and brother; Leukemia in her father.  SOCIAL HISTORY:  reports that she quit smoking about 32 years ago. Her smoking use included cigarettes. She has never used smokeless tobacco. She reports current alcohol use. She reports that she does not use drugs.  ALLERGIES: Ciprofloxacin, Loratadine-pseudoephedrine er, Penicillins, and Tolterodine tartrate  MEDICATIONS:  Current Outpatient Medications  Medication Sig Dispense Refill  . alendronate (FOSAMAX) 70 MG tablet Take 70 mg by mouth once a week. Take with a full glass of water on an empty stomach.    Marland Kitchen atorvastatin (LIPITOR) 10 MG tablet Take 10 mg by mouth daily.    . Vitamin D, Ergocalciferol, (DRISDOL) 50000 UNITS CAPS capsule Take 50,000 Units by mouth every 7 (seven) days.     No current facility-administered medications for this encounter.    REVIEW OF SYSTEMS:  Notable for that above.   PHYSICAL EXAM:  weight is 114 lb 2 oz (51.8 kg). Her oral temperature is  98.1 F (36.7 C). Her blood pressure is 126/53 (abnormal) and her pulse is 75. Her respiration is 17 and oxygen saturation is 98%.   General: Alert and oriented, in no acute distress  HEENT: Bilateral lacrimal gland swelling.  Her eyelids are swollen and it is difficult to see her globes.  Swelling is a little more substantial on the right than the left.  Sclera is erythematous bilaterally.  No significant exudate from the eyes.  Difficult to ascertain the extent of her extraocular movements due to eyelid swelling  Neck: Palpable cervical and periauricular adenopathy throughout the neck.  None of this is bulky  Psychiatric: Judgment and insight are intact. Affect is appropriate.   ECOG = 2  0 - Asymptomatic (Fully active, able to carry on all predisease activities without restriction)  1 - Symptomatic but completely ambulatory (Restricted in physically strenuous activity but ambulatory and able to carry out work of a light or sedentary nature. For example, light housework, office work)  2 - Symptomatic, <50% in bed during the day (Ambulatory and capable of all self care but unable to carry out any work activities. Up and about more than 50% of waking hours)  3 - Symptomatic, >50% in bed, but not bedbound (Capable of only limited self-care, confined to bed or chair 50% or more of waking hours)  4 -  Bedbound (Completely disabled. Cannot carry on any self-care. Totally confined to bed or chair)  5 - Death   Eustace Pen MM, Creech RH, Tormey DC, et al. [phone]). "Toxicity and response criteria of the [facility]". [place] Oncol. 5 (6): 649-55   LABORATORY DATA:  Lab Results  Component Value Date   WBC 33.1 (H) 12/15/2019   HGB 11.3 (L) 12/15/2019   HCT 35.0 (L) 12/15/2019   MCV 98.3 12/15/2019   PLT 81 (L) 12/15/2019   CMP     Component Value Date/Time   NA 138 12/15/2019 1215   NA 141 02/16/2017 1048   K 4.5 12/15/2019 1215   K 3.9 02/16/2017 1048   CL 107  12/15/2019 1215   CL 109 (H) 08/10/2012 1426   CO2 25 12/15/2019 1215   CO2 24 02/16/2017 1048   GLUCOSE 84 12/15/2019 1215   GLUCOSE 108 02/16/2017 1048   GLUCOSE 139 (H) 08/10/2012 1426   BUN 13 12/15/2019 1215   BUN 22.7 02/16/2017 1048   CREATININE 0.97 12/15/2019 1215   CREATININE 1.0 02/16/2017 1048   CALCIUM 9.1 12/15/2019 1215   CALCIUM 8.9 02/16/2017 1048   PROT 6.6 12/15/2019 1215   PROT 6.3 (L) 02/16/2017 1048   ALBUMIN 3.6 12/15/2019 1215   ALBUMIN 4.0 02/16/2017 1048   AST 31 12/15/2019 1215   AST 28 02/16/2017 1048   ALT 11 12/15/2019 1215   ALT 16 02/16/2017 1048   ALKPHOS 65 12/15/2019 1215   ALKPHOS 54 02/16/2017 1048   BILITOT 0.4 12/15/2019 1215   BILITOT 0.61 02/16/2017 1048   GFRNONAA 54 (L) 12/15/2019 1215   GFRAA >60 12/15/2019 1215         RADIOGRAPHY: As above  IMPRESSION/PLAN: CLL/SLL  Today, I talked to the patient about the findings and work-up thus far. We discussed the patient's diagnosis of CLL/SLL and general treatment for this, highlighting the role of radiotherapy in the management. We discussed the available radiation techniques, and focused on the details of logistics and delivery.    I discussed a palliative regimen that would consist of 2 consecutive daily radiation treatments.  There is a well-documented regimen that consists of 4 Gy in 2 fractions that has a high likelihood of shrinking down her masses and benefiting her palliatively.  My hope is that this will shrink down the lacrimal and orbital masses and kick start the recovery of her ophthalmic symptoms.  She understands that she will need to continue to follow with her eye doctor for additional symptom management even if the masses shrink away.  We may also treat lymph nodes that are enlarged in the head and neck region simultaneously.  We will scan the whole head and neck region at simulation and if there are certain key masses that I believe I can treat without causing  significant dysgeusia, I will consider doing that as well.  However, the patient and I agree that the most critical tissue to treat is in the orbital regions.  We discussed the risks, benefits, and side effects of radiotherapy. Side effects tend to be extremely minimal due to the low dose of radiation. No guarantees of treatment were given. A consent form was signed and placed in the patient's medical record.  If she does have side effects, she may have some temporary fatigue or watering of the eyes.  She understands that radiation increases the risks of cataracts but she already has had cataracts.  She may have dysgeusia depending on  the lymph nodes that we treat in the head and neck region, which is to be determined after I look at her anatomy on treatment planning day. We reviewed the consent form together and she is comfortable proceeding with treatment. The patient was encouraged to ask questions that I answered to the best of my ability.   She will undergo treatment planning next week.  Anticipate starting treatment about a week thereafter.  She and her son are pleased with this plan.  On date of service, in total, I spent 50 minutes on this encounter. Patient was seen in person.  __________________________________________   Eppie Gibson, MD  This document serves as a record of services personally performed by Eppie Gibson, MD. It was created on his behalf by Clerance Lav, a trained medical scribe. The creation of this record is based on the scribe's personal observations and the provider's statements to them. This document has been checked and approved by the attending provider.

## 2019-12-22 ENCOUNTER — Encounter: Payer: Self-pay | Admitting: Radiation Oncology

## 2019-12-22 ENCOUNTER — Ambulatory Visit
Admission: RE | Admit: 2019-12-22 | Discharge: 2019-12-22 | Disposition: A | Discharge status: Home / Self Care | Payer: Medicare HMO | Source: Ambulatory Visit | Attending: Radiation Oncology | Admitting: Radiation Oncology

## 2019-12-22 ENCOUNTER — Other Ambulatory Visit: Payer: Self-pay

## 2019-12-22 ENCOUNTER — Telehealth: Payer: Self-pay | Admitting: Hematology

## 2019-12-22 VITALS — BP 126/53 | HR 75 | Temp 98.1°F | Resp 17 | Wt 114.1 lb

## 2019-12-22 DIAGNOSIS — Z800 Family history of malignant neoplasm of digestive organs: Secondary | ICD-10-CM | POA: Diagnosis not present

## 2019-12-22 DIAGNOSIS — M1990 Unspecified osteoarthritis, unspecified site: Secondary | ICD-10-CM | POA: Insufficient documentation

## 2019-12-22 DIAGNOSIS — Z86010 Personal history of colonic polyps: Secondary | ICD-10-CM | POA: Insufficient documentation

## 2019-12-22 DIAGNOSIS — E785 Hyperlipidemia, unspecified: Secondary | ICD-10-CM | POA: Diagnosis not present

## 2019-12-22 DIAGNOSIS — K219 Gastro-esophageal reflux disease without esophagitis: Secondary | ICD-10-CM | POA: Insufficient documentation

## 2019-12-22 DIAGNOSIS — M8580 Other specified disorders of bone density and structure, unspecified site: Secondary | ICD-10-CM | POA: Diagnosis not present

## 2019-12-22 DIAGNOSIS — Z79899 Other long term (current) drug therapy: Secondary | ICD-10-CM | POA: Insufficient documentation

## 2019-12-22 DIAGNOSIS — R590 Localized enlarged lymph nodes: Secondary | ICD-10-CM | POA: Diagnosis not present

## 2019-12-22 DIAGNOSIS — C9110 Chronic lymphocytic leukemia of B-cell type not having achieved remission: Secondary | ICD-10-CM | POA: Insufficient documentation

## 2019-12-22 DIAGNOSIS — C8300 Small cell B-cell lymphoma, unspecified site: Secondary | ICD-10-CM

## 2019-12-22 DIAGNOSIS — Z87891 Personal history of nicotine dependence: Secondary | ICD-10-CM | POA: Insufficient documentation

## 2019-12-22 DIAGNOSIS — C8599 Non-Hodgkin lymphoma, unspecified, extranodal and solid organ sites: Secondary | ICD-10-CM

## 2019-12-22 DIAGNOSIS — H5789 Other specified disorders of eye and adnexa: Secondary | ICD-10-CM | POA: Diagnosis not present

## 2019-12-22 DIAGNOSIS — H538 Other visual disturbances: Secondary | ICD-10-CM | POA: Insufficient documentation

## 2019-12-22 NOTE — Progress Notes (Signed)
Oncology Nurse Navigator Documentation  Met with patient during initial consult with Dr. Isidore Moos. She was unaccompanied but her son was present on speaker phone.  . Further introduced myself as her/their Navigator, explained my role as a member of the Care Team. . Assisted with post-consult appt scheduling. . I provided a tour of CT simulation for her radiation planning and the [place] area where she will receive radiation treatments.    . They verbalized understanding of information provided. . I encouraged them to call with questions/concerns moving forward.  Harlow Asa, RN, BSN, OCN Head & Neck Oncology Nurse [place] at [place] [phone]

## 2019-12-22 NOTE — Telephone Encounter (Signed)
Called patient about appointments scheduled in January, patient is aware of appointments date and times.

## 2019-12-25 ENCOUNTER — Ambulatory Visit
Admission: RE | Admit: 2019-12-25 | Discharge: 2019-12-25 | Disposition: A | Discharge status: Home / Self Care | Payer: Medicare HMO | Source: Ambulatory Visit | Attending: Radiation Oncology | Admitting: Radiation Oncology

## 2019-12-25 ENCOUNTER — Other Ambulatory Visit: Payer: Self-pay

## 2019-12-25 VITALS — BP 116/48 | HR 86 | Temp 97.2°F | Resp 18 | Ht <= 58 in | Wt 114.2 lb

## 2019-12-25 DIAGNOSIS — C8300 Small cell B-cell lymphoma, unspecified site: Secondary | ICD-10-CM

## 2019-12-25 DIAGNOSIS — C9110 Chronic lymphocytic leukemia of B-cell type not having achieved remission: Secondary | ICD-10-CM | POA: Diagnosis present

## 2019-12-25 DIAGNOSIS — Z510 Encounter for antineoplastic radiation therapy: Secondary | ICD-10-CM | POA: Diagnosis not present

## 2019-12-25 MED ORDER — SODIUM CHLORIDE 0.9% FLUSH
10.0000 mL | Freq: Once | INTRAVENOUS | Status: AC
  Administered 2019-12-25: 10 mL via INTRAVENOUS

## 2019-12-25 NOTE — Progress Notes (Signed)
Has armband been applied?  Yes.    Does patient have an allergy to IV contrast dye?: No.   Has patient ever received premedication for IV contrast dye?: No.   Does patient take metformin?: No.  Date of lab work: December 15, 2019 BUN: 13 CR: 0.97  IV site: forearm left, condition patent and no redness  Has IV site been added to flowsheet?  Yes.    BP (!) 116/48 (BP Location: Right Arm, Patient Position: Sitting)   Pulse 86   Temp (!) 97.2 F (36.2 C) (Temporal)   Resp 18   Ht 4\' 9"  (1.448 m)   Wt 114 lb 4 oz (51.8 kg)   SpO2 100%   BMI 24.72 kg/m

## 2019-12-26 NOTE — Progress Notes (Signed)
Oncology Nurse Navigator Documentation  To provide support, encouragement and care continuity, met with Ms. Jaffee during her CT SIM. She was unaccompanied d/t [place] safety practices.   She tolerated procedure without difficulty, denied questions/concerns.   I toured her to [facility] treatment area, explained procedures for lobby registration, arrival to Radiation Waiting, arrival to tmt area and preparation for tmt.  She voiced understanding.   I encouraged him to call me prior to 01/02/20 [place].  Harlow Asa RN, BSN, OCN Head & Neck Oncology Nurse [place] at [facility] Phone # [phone]  Fax # [phone]

## 2019-12-27 DIAGNOSIS — Z510 Encounter for antineoplastic radiation therapy: Secondary | ICD-10-CM | POA: Diagnosis not present

## 2020-01-02 ENCOUNTER — Ambulatory Visit
Admission: RE | Admit: 2020-01-02 | Discharge: 2020-01-02 | Disposition: A | Discharge status: Home / Self Care | Payer: Medicare HMO | Source: Ambulatory Visit | Attending: Radiation Oncology | Admitting: Radiation Oncology

## 2020-01-02 DIAGNOSIS — Z510 Encounter for antineoplastic radiation therapy: Secondary | ICD-10-CM | POA: Diagnosis not present

## 2020-01-03 ENCOUNTER — Other Ambulatory Visit: Payer: Self-pay

## 2020-01-03 ENCOUNTER — Encounter: Payer: Self-pay | Admitting: Radiation Oncology

## 2020-01-03 ENCOUNTER — Ambulatory Visit
Admission: RE | Admit: 2020-01-03 | Discharge: 2020-01-03 | Disposition: A | Discharge status: Home / Self Care | Payer: Medicare HMO | Source: Ambulatory Visit | Attending: Radiation Oncology | Admitting: Radiation Oncology

## 2020-01-03 DIAGNOSIS — Z510 Encounter for antineoplastic radiation therapy: Secondary | ICD-10-CM | POA: Diagnosis not present

## 2020-02-14 ENCOUNTER — Telehealth: Payer: Self-pay | Admitting: *Deleted

## 2020-02-14 NOTE — Telephone Encounter (Signed)
Patient called to cancel due to having a bad cold. She said she would call back to reschedule when she is feeling better.

## 2020-02-16 ENCOUNTER — Ambulatory Visit: Payer: Self-pay | Admitting: Radiation Oncology

## 2020-03-01 NOTE — Progress Notes (Signed)
  Patient Name: Brianna York MRN: [id number] DOB: [birth year] Referring Physician: Sullivan Lone (Profile Not Attached) Date of Service: 01/03/2020 [facility], [place]                                                        End Of Treatment Note  Diagnoses: C85.99-Non-Hodgkin lymphoma, unspecified, extranodal and solid organ sites C91.10-Chronic lymphocytic leukemia of B-cell type not having achieved remission    Intent: Palliative  Radiation Treatment Dates: 01/02/2020 through 01/03/2020 Site Technique Total Dose (Gy) Dose per Fx (Gy) Completed Fx Beam Energies  Face: HN_orbits_neck 3D 4/4 2 2/2 6X, 10X   Narrative: The patient tolerated radiation therapy relatively well.  The patient's bilateral orbits were treated as well as the bulkiest palpable nodes in her bilateral neck.  Plan: The patient will follow-up with radiation oncology in 72mo. (Addendum: patient cancelled this visit due to a "bad cold." She has f/u w med Adella Nissen in January.)  -----------------------------------  Eppie Gibson, MD

## 2020-03-14 ENCOUNTER — Telehealth: Payer: Self-pay | Admitting: Hematology

## 2020-03-14 NOTE — Telephone Encounter (Signed)
Scheduled per los, patient has been called and notified of upcoming appointments. 

## 2020-03-22 ENCOUNTER — Other Ambulatory Visit: Payer: Self-pay

## 2020-03-22 ENCOUNTER — Inpatient Hospital Stay (HOSPITAL_BASED_OUTPATIENT_CLINIC_OR_DEPARTMENT_OTHER): Payer: Medicare HMO | Admitting: Hematology

## 2020-03-22 ENCOUNTER — Inpatient Hospital Stay: Payer: Medicare HMO | Attending: Hematology

## 2020-03-22 VITALS — BP 124/56 | HR 73 | Temp 98.0°F | Resp 16 | Ht <= 58 in | Wt 114.7 lb

## 2020-03-22 DIAGNOSIS — C9110 Chronic lymphocytic leukemia of B-cell type not having achieved remission: Secondary | ICD-10-CM | POA: Insufficient documentation

## 2020-03-22 DIAGNOSIS — Z79899 Other long term (current) drug therapy: Secondary | ICD-10-CM | POA: Insufficient documentation

## 2020-03-22 DIAGNOSIS — Z800 Family history of malignant neoplasm of digestive organs: Secondary | ICD-10-CM | POA: Diagnosis not present

## 2020-03-22 DIAGNOSIS — M8580 Other specified disorders of bone density and structure, unspecified site: Secondary | ICD-10-CM | POA: Insufficient documentation

## 2020-03-22 DIAGNOSIS — Z833 Family history of diabetes mellitus: Secondary | ICD-10-CM | POA: Insufficient documentation

## 2020-03-22 DIAGNOSIS — Z87891 Personal history of nicotine dependence: Secondary | ICD-10-CM | POA: Diagnosis not present

## 2020-03-22 DIAGNOSIS — Z853 Personal history of malignant neoplasm of breast: Secondary | ICD-10-CM | POA: Insufficient documentation

## 2020-03-22 DIAGNOSIS — Z806 Family history of leukemia: Secondary | ICD-10-CM | POA: Insufficient documentation

## 2020-03-22 DIAGNOSIS — D696 Thrombocytopenia, unspecified: Secondary | ICD-10-CM | POA: Diagnosis not present

## 2020-03-22 LAB — LACTATE DEHYDROGENASE: LDH: 552 U/L — ABNORMAL HIGH (ref 98–192)

## 2020-03-22 LAB — CMP (CANCER CENTER ONLY)
ALT: 8 U/L (ref 0–44)
AST: 29 U/L (ref 15–41)
Albumin: 3.3 g/dL — ABNORMAL LOW (ref 3.5–5.0)
Alkaline Phosphatase: 64 U/L (ref 38–126)
Anion gap: 7 (ref 5–15)
BUN: 16 mg/dL (ref 8–23)
CO2: 23 mmol/L (ref 22–32)
Calcium: 8.8 mg/dL — ABNORMAL LOW (ref 8.9–10.3)
Chloride: 107 mmol/L (ref 98–111)
Creatinine: 0.96 mg/dL (ref 0.44–1.00)
GFR, Estimated: 59 mL/min — ABNORMAL LOW (ref >60)
Glucose, Bld: 87 mg/dL (ref 70–99)
Potassium: 4.5 mmol/L (ref 3.5–5.1)
Sodium: 137 mmol/L (ref 135–145)
Total Bilirubin: 0.5 mg/dL (ref 0.3–1.2)
Total Protein: 7.7 g/dL (ref 6.5–8.1)

## 2020-03-22 LAB — CBC WITH DIFFERENTIAL/PLATELET
Abs Immature Granulocytes: 0 10*3/uL (ref 0.00–0.07)
Basophils Absolute: 0 10*3/uL (ref 0.0–0.1)
Basophils Relative: 0 %
Eosinophils Absolute: 0 10*3/uL (ref 0.0–0.5)
Eosinophils Relative: 0 %
HCT: 34.2 % — ABNORMAL LOW (ref 36.0–46.0)
Hemoglobin: 10.8 g/dL — ABNORMAL LOW (ref 12.0–15.0)
Lymphocytes Relative: 89 %
Lymphs Abs: 20.6 10*3/uL — ABNORMAL HIGH (ref 0.7–4.0)
MCH: 30.7 pg (ref 26.0–34.0)
MCHC: 31.6 g/dL (ref 30.0–36.0)
MCV: 97.2 fL (ref 80.0–100.0)
Monocytes Absolute: 1.2 10*3/uL — ABNORMAL HIGH (ref 0.1–1.0)
Monocytes Relative: 5 %
Neutro Abs: 1.4 10*3/uL — ABNORMAL LOW (ref 1.7–7.7)
Neutrophils Relative %: 6 %
Platelets: 80 10*3/uL — ABNORMAL LOW (ref 150–400)
RBC: 3.52 MIL/uL — ABNORMAL LOW (ref 3.87–5.11)
RDW: 14.7 % (ref 11.5–15.5)
WBC: 23.2 10*3/uL — ABNORMAL HIGH (ref 4.0–10.5)
nRBC: 0 % (ref 0.0–0.2)

## 2020-03-22 NOTE — Progress Notes (Signed)
HEMATOLOGY/ONCOLOGY CLINIC NOTE  Date of Service: 03/22/2020  Patient Care Team: [name], MD as PCP - General (Internal Medicine)  CHIEF COMPLAINTS/PURPOSE OF CONSULTATION:  Chronic Lymphocytic Leukemia  Oncologic History:  Oncology History  Breast cancer of upper-outer quadrant of left female breast ([place])  09/16/2010 Surgery   Left breast 1.5 cm invasive ductal carcinoma with calcifications, grade 1, perineural invasion was seen, DCIS, margins negative, 2 sentinel nodes negative, ER 99%, PR negative, Ki-67 79%, HER-2 negative, Oncotype 24, 16% ROR   10/25/2010 -  Anti-estrogen oral therapy   Arimidex 1 mg daily developed side effects and was held briefly and restarted   Lymphoma, small lymphocytic ([place])  11/16/2014 Initial Diagnosis   Lymphoma, small lymphocytic: Based on cervical lymph node biopsy CD5 positive CD10 negative     HISTORY OF PRESENTING ILLNESS:   Brianna York is a wonderful 86 y.o. female who has been referred to [place] by my colleague Dr. Nicholas Lose for evaluation and management of Chronic Lymphocytic Leukemia. The pt reports that she is doing well overall.  The pt was initially diagnosed with SLL/CLL in 2016 after a cervical lymph node biopsy. The pt reports that she "wears out easy, a little tired, otherwise active." She notes that she takes a nap in the afternoon but notes this could be attributable to her age. She denies her fatigue interrupting her daily life. The pt notes that she has lost about 10 pounds over the last 4 years. She notes that her appetite "has probably changed some," and notes that she eats less, noting she "fills up faster." The pt denies any fevers, chills, or night sweats. She continues to enjoy eating and is not food averse.   The pt notes that she senses some lumps under her neck and on either side of her neck, which have grown gradually and do not bother her. She denies SOB, abdominal fullness or pains, and denies leg swelling.  However, she has recently been taking antibiotics for H.pylori. She denies skin rashes.  She notes that she is unsure if she is continuing to take Anastrozole, which she began in 2012. She notes that she takes care of herself in her home. Her brother lives down the street and checks in on her every now and then. She lives in her own home at this time and endorses being able to function well.  Most recent lab results (05/23/18) of CBC w/diff and CMP is as follows: all values are WNL except for WBC at 32k, PLT at 112k, Lymphs abs at 27.4k, Monocytes abs at 2.1k, GFR at 53. 05/23/18 LDH at 444  On review of systems, pt reports gradual neck lumps, functioning well, mostly stable energy levels with some occasional tiredness, chronic back pain, and denies fevers, chills, night sweats, new fatigue, unexpected weight loss, armpit or groin swelling, CP, changes in breathing, SOB, abdominal pains, abdominal fullness, upper abdominal pains, leg swelling, calf swelling, skin rashes, and any other symptoms.  On PMHx the pt reports breast cancer diagnosed in 2012, CLL/SLL. On Family Hx the pt reports Father died of acute leukemia.  Interval History:   Brianna York returns today for management and evaluation of her CLL. The patient's last visit with [place] was on 12/15/2019. The pt reports that she is doing well overall.  The pt reports no problems following the treatment and the completion of her radiation therapy in October. There is no longer any eye swelling. She notes the presence of purple spots  on her arms reminiscent of bleeding or bruising. They occur intermittently on both her right and left arms, alternating.  The pt notes an increase in appetite and some weight loss. The last weight measurements show that weight has been stable.  The pt has has both her COVID vaccines and booster, as well as the annual flu vaccine.  Lab results today (03/22/20) of CBC w/diff and CMP is as follows: all values are WNL  except for WBC at 23.2, RBC at 3.52, Hgb at 10.8, HCT at 34.2.  On review of systems, pt reports arm spotting, minor leg swelling and denies fevers, chills, weight loss, night sweats, new lumps/bumps, back pain, abdominal pain and any other symptoms.    MEDICAL HISTORY:  Past Medical History:  Diagnosis Date  . Adenomatous colon polyp    tubular  . Arthritis    osteo arthritis  . Breast cancer ([place]) 02/16/2011  . Diverticulosis   . GERD (gastroesophageal reflux disease)   . Hyperlipidemia   . Leukemia ([place])   . Lymphoma ([place])    small lympocytic  . Osteopenia     SURGICAL HISTORY: Past Surgical History:  Procedure Laterality Date  . bartholian cyst  1986  . BREAST LUMPECTOMY Left 09-16-2010  . CATARACT EXTRACTION, BILATERAL    . [place]  . KNEE ARTHROSCOPY Right 2010  . LYMPH GLAND EXCISION Right 11/16/2014   Procedure: EXCISION AND BIOPSY OF RIGHT CERVICAL LYMPH NODE;  Surgeon: Jackolyn Confer, MD;  Location: [facility];  Service: General;  Laterality: Right;  . TONSILLECTOMY  1940    SOCIAL HISTORY: Social History   Socioeconomic History  . Marital status: Widowed    Spouse name: Not on file  . Number of children: 5  . Years of education: Not on file  . Highest education level: Not on file  Occupational History  . Occupation: retired  Tobacco Use  . Smoking status: Former Smoker    Types: Cigarettes    Quit date: 1989    Years since quitting: 33.0  . Smokeless tobacco: Never Used  . Tobacco comment: Quit smoking 27 years ago  Vaping Use  . Vaping Use: Never used  Substance and Sexual Activity  . Alcohol use: Yes    Comment: occasional glass wine  . Drug use: No  . Sexual activity: Not Currently  Other Topics Concern  . Not on file  Social History Narrative  . Not on file   Social Determinants of Health   Financial Resource Strain: Not on file  Food Insecurity: Not on file  Transportation Needs: Not on file   Physical Activity: Not on file  Stress: Not on file  Social Connections: Not on file  Intimate Partner Violence: Not on file    FAMILY HISTORY: Family History  Problem Relation Age of Onset  . Colon cancer Mother 67  . Colon cancer Maternal Aunt        had colon sugery  . Leukemia Father   . Diabetes Brother   . Diabetes Brother     ALLERGIES:  is allergic to ciprofloxacin, loratadine-pseudoephedrine er, penicillins, and tolterodine tartrate.  MEDICATIONS:  Current Outpatient Medications  Medication Sig Dispense Refill  . alendronate (FOSAMAX) 70 MG tablet Take 70 mg by mouth once a week. Take with a full glass of water on an empty stomach.    Marland Kitchen atorvastatin (LIPITOR) 10 MG tablet Take 10 mg by mouth daily.    . Vitamin D, Ergocalciferol, (DRISDOL)  50000 UNITS CAPS capsule Take 50,000 Units by mouth every 7 (seven) days.     No current facility-administered medications for this visit.    REVIEW OF SYSTEMS:   A 10+ POINT REVIEW OF SYSTEMS WAS OBTAINED including neurology, dermatology, psychiatry, cardiac, respiratory, lymph, extremities, GI, GU, Musculoskeletal, constitutional, breasts, reproductive, HEENT.  All pertinent positives are noted in the HPI.  All others are negative.   PHYSICAL EXAMINATION: ECOG PERFORMANCE STATUS: 1 - Symptomatic but completely ambulatory  . Vitals:   03/22/20 1102  BP: (!) 124/56  Pulse: 73  Resp: 16  Temp: 98 F (36.7 C)  SpO2: 100%   Filed Weights   03/22/20 1102  Weight: 114 lb 11.2 oz (52 kg)   .Body mass index is 24.82 kg/m.   GENERAL:alert, in no acute distress and comfortable SKIN: no acute rashes, no significant lesions EYES: conjunctiva are pink and non-injected, sclera anicteric OROPHARYNX: MMM, no exudates, no oropharyngeal erythema or ulceration NECK: supple, no JVD LYMPH:  no palpable lymphadenopathy in the inguinal region. Cervical and axillary lymphadenopathy b/l not changed from last visit. LUNGS: clear to  auscultation b/l with normal respiratory effort HEART: regular rate & rhythm ABDOMEN:  normoactive bowel sounds , non tender, not distended. No palpable hepatosplenomegaly.  Extremity: 2+ pedal edema b/l  PSYCH: alert & oriented x 3 with fluent speech NEURO: no focal motor/sensory deficits  LABORATORY DATA:  I have reviewed the data as listed  . CBC Latest Ref Rng & Units 03/22/2020 12/15/2019 11/21/2019  WBC 4.0 - 10.5 K/uL 23.2(H) 33.1(H) 34.6(H)  Hemoglobin 12.0 - 15.0 g/dL 10.8(L) 11.3(L) 11.0(L)  Hematocrit 36.0 - 46.0 % 34.2(L) 35.0(L) 34.3(L)  Platelets 150 - 400 K/uL 80(L) 81(L) 87(L)    . CMP Latest Ref Rng & Units 03/22/2020 12/15/2019 11/21/2019  Glucose 70 - 99 mg/dL 87 84 89  BUN 8 - 23 mg/dL _0 Creatinine 0.44 - 1.00 mg/dL 0.96 0.97 0.87  Sodium 135 - 145 mmol/L 137 138 139  Potassium 3.5 - 5.1 mmol/L 4.5 4.5 4.3  Chloride 98 - 111 mmol/L 107 107 110  CO2 22 - 32 mmol/L _1 Calcium 8.9 - 10.3 mg/dL 8.8(L) 9.1 9.0  Total Protein 6.5 - 8.1 g/dL 7.7 6.6 6.6  Total Bilirubin 0.3 - 1.2 mg/dL 0.5 0.4 0.4  Alkaline Phos 38 - 126 U/L 64 65 58  AST 15 - 41 U/L _2 ALT 0 - 44 U/L _3 . Lab Results  Component Value Date   LDH 612 (H) 12/15/2019     Surgical Pathology  CASE: [id number]  PATIENT: Derotha Bostic  Flow Pathology Report      Clinical history: right orbital mass   DIAGNOSIS:   - Monoclonal B-cell population identified, see comment.   COMMENT:   There is a monoclonal population of B-cells with expression of CD5 and  CD200. The phenotype is consistent with the patient's history of small  lymphocytic lymphoma.   02/21/18 Left Lacrimal sac biopsy:    12/18/14 Cytogenetics:    12/04/14 Right cervical lymph node biopsy:   06/27/18 FISH CLL Prognostic Panel:    RADIOGRAPHIC STUDIES: I have personally reviewed the radiological images as listed and agreed with the findings in the report. No results found.  ASSESSMENT &  PLAN:   86 y.o. female with  1. Chronic Lymphocytic Leukemia 11/16/14 Right cervical lymph node biopsy revealed SLL/CLL 12/18/14 Cytogenetics revealed 59% of cells with gain of  CEP12 and 5% of cells with loss of ATM 02/21/18 Right lacrimal gland revealed lymphoid infiltrates 06/27/18 FISH CLL Prognostic Panel revealed 49.33% of cells with Trisomy 12, and only 2% of cells with 17p mutation  08/03/18 PET/CT revealed "Exam positive for FDG avid adenopathy within the neck, chest, abdomen and pelvis compatible with the clinical history of chronic lymphocytic leukemia. 2. Splenomegaly (about 13cm) with mild increased radiotracer uptake throughout the spleen (above blood pool activity)".   2. History of Invasive Ductal Carcinoma 09/16/10 Left breast 1.5cm IDC with calcifications, grade 1, perineural invasion. DCIS margins negative, 2 sentinel nodes negative, ER 99%, PR negative, Ki-67 at 79%, HER-2 negative, Oncotype 24, 16% ROR Continues on 1mg Anastrozole daily, began on 10/25/10. Last available Mammogram on 12/31/16 negative  3.  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  Recent right orbital biopsy also showed CLL/SLL.  4.  Thrombocytopenia related to CLL stable platelets in the 80k range. PLAN:  -Discussed pt labwork today, 03/22/20; mild anemia, WBC lower, PLT stable, chemistries stable, LDH remain elevated but unrelated to Lymphoma -No marked clinical evidence of CLL/SLL progression. -occular symptoms from SLL resolved with RT -Advised pt we will watch symptoms to observe if further treatment is needed -Advise pt to use compression socks and keep legs elevated when resting to alleviate leg swelling.  -Will see back 4 months with labs   FOLLOW UP: RTC with Dr Kale in 4 months with labs   The total time spent in the appt was 20 minutes and more than 50% was on counseling and direct patient cares.  All of the patient's questions were answered with apparent satisfaction.  The patient knows to call the clinic with any problems, questions or concerns.    Gautam Kale MD MS AAHIVMS [facility] Hematology/Oncology Physician [facility]  (Office):       [phone] (Work cell):  [phone] (Fax):           [phone]  03/22/2020 7:34 AM  I, Jazzmine Knight, am acting as a scribe for Dr. Gautam Kale.   .I have reviewed the above documentation for accuracy and completeness, and I agree with the above. .Gautam Kishore Kale MD         

## 2020-05-01 ENCOUNTER — Other Ambulatory Visit: Payer: Self-pay | Admitting: Internal Medicine

## 2020-05-01 ENCOUNTER — Ambulatory Visit
Admission: RE | Admit: 2020-05-01 | Discharge: 2020-05-01 | Disposition: A | Discharge status: Home / Self Care | Payer: Medicare HMO | Source: Ambulatory Visit | Attending: Internal Medicine | Admitting: Internal Medicine

## 2020-05-01 ENCOUNTER — Other Ambulatory Visit: Payer: Self-pay

## 2020-05-01 DIAGNOSIS — R109 Unspecified abdominal pain: Secondary | ICD-10-CM

## 2020-05-01 DIAGNOSIS — R0602 Shortness of breath: Secondary | ICD-10-CM

## 2020-05-01 MED ORDER — IOPAMIDOL (ISOVUE-370) INJECTION 76%
75.0000 mL | Freq: Once | INTRAVENOUS | Status: AC | PRN
  Administered 2020-05-01: 75 mL via INTRAVENOUS

## 2020-05-22 ENCOUNTER — Other Ambulatory Visit: Payer: Self-pay | Admitting: Internal Medicine

## 2020-05-22 ENCOUNTER — Other Ambulatory Visit (HOSPITAL_COMMUNITY): Payer: Self-pay | Admitting: Internal Medicine

## 2020-05-22 DIAGNOSIS — R413 Other amnesia: Secondary | ICD-10-CM

## 2020-05-31 ENCOUNTER — Other Ambulatory Visit: Payer: Self-pay

## 2020-05-31 ENCOUNTER — Ambulatory Visit (HOSPITAL_COMMUNITY)
Admission: RE | Admit: 2020-05-31 | Discharge: 2020-05-31 | Disposition: A | Discharge status: Home / Self Care | Payer: Medicare HMO | Source: Ambulatory Visit | Attending: Internal Medicine | Admitting: Internal Medicine

## 2020-05-31 DIAGNOSIS — R413 Other amnesia: Secondary | ICD-10-CM | POA: Diagnosis present

## 2020-07-18 NOTE — Progress Notes (Signed)
HEMATOLOGY/ONCOLOGY CLINIC NOTE  Date of Service: 07/19/2020  Patient Care Team: [name], MD as PCP - General (Internal Medicine)  CHIEF COMPLAINTS/PURPOSE OF CONSULTATION:  Chronic Lymphocytic Leukemia  Oncologic History:  Oncology History  Breast cancer of upper-outer quadrant of left female breast ([place])  09/16/2010 Surgery   Left breast 1.5 cm invasive ductal carcinoma with calcifications, grade 1, perineural invasion was seen, DCIS, margins negative, 2 sentinel nodes negative, ER 99%, PR negative, Ki-67 79%, HER-2 negative, Oncotype 24, 16% ROR   10/25/2010 -  Anti-estrogen oral therapy   Arimidex 1 mg daily developed side effects and was held briefly and restarted   Lymphoma, small lymphocytic ([place])  11/16/2014 Initial Diagnosis   Lymphoma, small lymphocytic: Based on cervical lymph node biopsy CD5 positive CD10 negative     HISTORY OF PRESENTING ILLNESS:   Brianna York is a wonderful 86 y.o. female who has been referred to [place] by my colleague Dr. Nicholas Lose for evaluation and management of Chronic Lymphocytic Leukemia. The pt reports that she is doing well overall.  The pt was initially diagnosed with SLL/CLL in 2016 after a cervical lymph node biopsy. The pt reports that she "wears out easy, a little tired, otherwise active." She notes that she takes a nap in the afternoon but notes this could be attributable to her age. She denies her fatigue interrupting her daily life. The pt notes that she has lost about 10 pounds over the last 4 years. She notes that her appetite "has probably changed some," and notes that she eats less, noting she "fills up faster." The pt denies any fevers, chills, or night sweats. She continues to enjoy eating and is not food averse.   The pt notes that she senses some lumps under her neck and on either side of her neck, which have grown gradually and do not bother her. She denies SOB, abdominal fullness or pains, and denies leg swelling.  However, she has recently been taking antibiotics for H.pylori. She denies skin rashes.  She notes that she is unsure if she is continuing to take Anastrozole, which she began in 2012. She notes that she takes care of herself in her home. Her brother lives down the street and checks in on her every now and then. She lives in her own home at this time and endorses being able to function well.  Most recent lab results (05/23/18) of CBC w/diff and CMP is as follows: all values are WNL except for WBC at 32k, PLT at 112k, Lymphs abs at 27.4k, Monocytes abs at 2.1k, GFR at 53. 05/23/18 LDH at 444  On review of systems, pt reports gradual neck lumps, functioning well, mostly stable energy levels with some occasional tiredness, chronic back pain, and denies fevers, chills, night sweats, new fatigue, unexpected weight loss, armpit or groin swelling, CP, changes in breathing, SOB, abdominal pains, abdominal fullness, upper abdominal pains, leg swelling, calf swelling, skin rashes, and any other symptoms.  On PMHx the pt reports breast cancer diagnosed in 2012, CLL/SLL. On Family Hx the pt reports Father died of acute leukemia.  Interval History:   Brianna York returns today for management and evaluation of her CLL. The patient's last visit with [place] was on 03/22/2020. The pt reports that she is doing well overall.  The pt reports that her memory problems have been worsening recently. The pt notes she has spots on her arms. She denies being on any blood thinners. The pt notes that  she sees her PCP on June 6 and will discuss the memory issues with him at that visit. The pt notes she is not driving in general, but did drive herself to this visit. The pt notes her younger brother is now living with her. He is sixteen years younger than her and is functioning well.  The pt notes that she may have to consider relocating closer to her children when the time comes. She notes that her son helps her makes medical  decisions at times when needed. The pt notes she has one son in [place], [place], three children in [place], and one daughter in [place]. The pt notes her daughter Threasa Beards and son Wille Glaser have healthcare and legal POA.  Lab results today 07/19/2020 of CBC w/diff and CMP is as follows: all values are WNL except for WBC of 19.3K, RBC of 3.54, Hgb of 10.9, HCT of 33.4, Plt of 78K, Neutro Abs of 1.5K, Lymphs Abs of 17.6K, Albumin of 3.4, GFR est of 56. 07/19/2020 LDH of 625.  On review of systems, pt reports worsening memory loss, night sweats and denies fevers, chills, sudden weight loss, and any other symptoms.   MEDICAL HISTORY:  Past Medical History:  Diagnosis Date  . Adenomatous colon polyp    tubular  . Arthritis    osteo arthritis  . Breast cancer ([place]) 02/16/2011  . Diverticulosis   . GERD (gastroesophageal reflux disease)   . Hyperlipidemia   . Leukemia ([place])   . Lymphoma ([place])    small lympocytic  . Osteopenia     SURGICAL HISTORY: Past Surgical History:  Procedure Laterality Date  . bartholian cyst  1986  . BREAST LUMPECTOMY Left 09-16-2010  . CATARACT EXTRACTION, BILATERAL    . [place]  . KNEE ARTHROSCOPY Right 2010  . LYMPH GLAND EXCISION Right 11/16/2014   Procedure: EXCISION AND BIOPSY OF RIGHT CERVICAL LYMPH NODE;  Surgeon: Jackolyn Confer, MD;  Location: [facility];  Service: General;  Laterality: Right;  . TONSILLECTOMY  1940    SOCIAL HISTORY: Social History   Socioeconomic History  . Marital status: Widowed    Spouse name: Not on file  . Number of children: 5  . Years of education: Not on file  . Highest education level: Not on file  Occupational History  . Occupation: retired  Tobacco Use  . Smoking status: Former Smoker    Types: Cigarettes    Quit date: 1989    Years since quitting: 33.3  . Smokeless tobacco: Never Used  . Tobacco comment: Quit smoking 27 years ago  Vaping Use  . Vaping Use: Never used  Substance and  Sexual Activity  . Alcohol use: Yes    Comment: occasional glass wine  . Drug use: No  . Sexual activity: Not Currently  Other Topics Concern  . Not on file  Social History Narrative  . Not on file   Social Determinants of Health   Financial Resource Strain: Not on file  Food Insecurity: Not on file  Transportation Needs: Not on file  Physical Activity: Not on file  Stress: Not on file  Social Connections: Not on file  Intimate Partner Violence: Not on file    FAMILY HISTORY: Family History  Problem Relation Age of Onset  . Colon cancer Mother 42  . Colon cancer Maternal Aunt        had colon sugery  . Leukemia Father   . Diabetes Brother   . Diabetes  Brother     ALLERGIES:  is allergic to ciprofloxacin, loratadine-pseudoephedrine er, penicillins, and tolterodine tartrate.  MEDICATIONS:  Current Outpatient Medications  Medication Sig Dispense Refill  . alendronate (FOSAMAX) 70 MG tablet Take 70 mg by mouth once a week. Take with a full glass of water on an empty stomach.    Marland Kitchen atorvastatin (LIPITOR) 10 MG tablet Take 10 mg by mouth daily.    . Vitamin D, Ergocalciferol, (DRISDOL) 50000 UNITS CAPS capsule Take 50,000 Units by mouth every 7 (seven) days.     No current facility-administered medications for this visit.    REVIEW OF SYSTEMS:   10 Point review of Systems was done is negative except as noted above.  PHYSICAL EXAMINATION: ECOG PERFORMANCE STATUS: 1 - Symptomatic but completely ambulatory  . Vitals:   07/19/20 1123  BP: (!) 145/62  Pulse: 76  Resp: 18  Temp: (!) 97.1 F (36.2 C)  SpO2: 98%   Filed Weights   07/19/20 1123  Weight: 113 lb 6.4 oz (51.4 kg)   .Body mass index is 25.42 kg/m.   Exam was given in a chair.   GENERAL:alert, in no acute distress and comfortable SKIN: no acute rashes, no significant lesions EYES: conjunctiva are pink and non-injected, sclera anicteric OROPHARYNX: MMM, no exudates, no oropharyngeal erythema or  ulceration NECK: supple, no JVD LYMPH:  no palpable lymphadenopathy in the inguinal region. Cervical and axillary lymphadenopathy b/l. LUNGS: clear to auscultation b/l with normal respiratory effort HEART: regular rate & rhythm ABDOMEN:  normoactive bowel sounds , non tender, not distended. Mildly enlarged spleen. Extremity: no pedal edema PSYCH: alert & oriented x 3 with fluent speech NEURO: no focal motor/sensory deficits  LABORATORY DATA:  I have reviewed the data as listed  . CBC Latest Ref Rng & Units 07/19/2020 03/22/2020 12/15/2019  WBC 4.0 - 10.5 K/uL 19.3(H) 23.2(H) 33.1(H)  Hemoglobin 12.0 - 15.0 g/dL 10.9(L) 10.8(L) 11.3(L)  Hematocrit 36.0 - 46.0 % 33.4(L) 34.2(L) 35.0(L)  Platelets 150 - 400 K/uL 78(L) 80(L) 81(L)    . CMP Latest Ref Rng & Units 07/19/2020 03/22/2020 12/15/2019  Glucose 70 - 99 mg/dL 88 87 84  BUN 8 - 23 mg/dL '13 16 13  ' Creatinine 0.44 - 1.00 mg/dL 1.00 0.96 0.97  Sodium 135 - 145 mmol/L 137 137 138  Potassium 3.5 - 5.1 mmol/L 4.9 4.5 4.5  Chloride 98 - 111 mmol/L 104 107 107  CO2 22 - 32 mmol/L '25 23 25  ' Calcium 8.9 - 10.3 mg/dL 9.2 8.8(L) 9.1  Total Protein 6.5 - 8.1 g/dL 7.7 7.7 6.6  Total Bilirubin 0.3 - 1.2 mg/dL 0.4 0.5 0.4  Alkaline Phos 38 - 126 U/L 60 64 65  AST 15 - 41 U/L 32 29 31  ALT 0 - 44 U/L '6 8 11   ' . Lab Results  Component Value Date   LDH 625 (H) 07/19/2020     Surgical Pathology  CASE: [id number]  PATIENT: Mariah Imber  Flow Pathology Report      Clinical history: right orbital mass   DIAGNOSIS:   - Monoclonal B-cell population identified, see comment.   COMMENT:   There is a monoclonal population of B-cells with expression of CD5 and  CD200. The phenotype is consistent with the patient's history of small  lymphocytic lymphoma.   02/21/18 Left Lacrimal sac biopsy:    12/18/14 Cytogenetics:    12/04/14 Right cervical lymph node biopsy:   06/27/18 FISH CLL Prognostic Panel:    RADIOGRAPHIC  STUDIES: I have personally reviewed the radiological images as listed and agreed with the findings in the report. No results found.  ASSESSMENT & PLAN:   86 y.o. female with  1. Chronic Lymphocytic Leukemia 11/16/14 Right cervical lymph node biopsy revealed SLL/CLL 12/18/14 Cytogenetics revealed 59% of cells with gain of CEP12 and 5% of cells with loss of ATM 02/21/18 Right lacrimal gland revealed lymphoid infiltrates 06/27/18 FISH CLL Prognostic Panel revealed 49.33% of cells with Trisomy 12, and only 2% of cells with 17p mutation  08/03/18 PET/CT revealed "Exam positive for FDG avid adenopathy within the neck, chest, abdomen and pelvis compatible with the clinical history of chronic lymphocytic leukemia. 2. Splenomegaly (about 13cm) with mild increased radiotracer uptake throughout the spleen (above blood pool activity)".   2. History of Invasive Ductal Carcinoma 09/16/10 Left breast 1.5cm IDC with calcifications, grade 1, perineural invasion. DCIS margins negative, 2 sentinel nodes negative, ER 99%, PR negative, Ki-67 at 79%, HER-2 negative, Oncotype 24, 16% ROR Continues on 26m Anastrozole daily, began on 10/25/10. Last available Mammogram on 12/31/16 negative  3.  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  Recent right orbital biopsy also showed CLL/SLL.  4.  Thrombocytopenia related to CLL stable platelets in the 80k range.  PLAN:  -Discussed pt labwork today, 07/19/2020; WBC improved, Plt and Hgb stable. Chemistries stable. LDH still elevated. -Advised pt that it would not be recommended to drive herself due to vision issues and recent memory loss progression. -Recommended pt stay well moisturized and avoid bumping into objects. -Advised pt that she is at the point where should would meet certain criteria for initiating treatment, but we would not want to jump into treatment due to recent progressive memory loss. -Advised pt the current priority is the recent  progression in memory loss. -No marked clinical evidence of CLL/SLL progression .-Advised pt we will watch symptoms to observe if further treatment is needed -Advise pt to use compression socks and keep legs elevated when resting to alleviate leg swelling.  -Start Vitamin B-Complex daily. -Will see back in 4 months with labs.   FOLLOW UP: RTC with Dr KIrene Limbowith labs in 4 months   The total time spent in the appt was 20 minutes and more than 50% was on counseling and direct patient cares.  All of the patient's questions were answered with apparent satisfaction. The patient knows to call the clinic with any problems, questions or concerns.    GSullivan LoneMD MFreeportAAHIVMS [facility] SpringsCSurgcenter Northeast LLCHematology/Oncology Physician [facility] (Office):       [phone](Work cell):  [phone](Fax):           [phone] 07/19/2020 12:23 PM  I, RReinaldo Raddle am acting as scribe for Dr. GSullivan Lone MD.     .I have reviewed the above documentation for accuracy and completeness, and I agree with the above. .Brunetta GeneraMD

## 2020-07-19 ENCOUNTER — Other Ambulatory Visit: Payer: Self-pay

## 2020-07-19 ENCOUNTER — Inpatient Hospital Stay (HOSPITAL_BASED_OUTPATIENT_CLINIC_OR_DEPARTMENT_OTHER): Payer: Medicare HMO | Admitting: Hematology

## 2020-07-19 ENCOUNTER — Inpatient Hospital Stay: Payer: Medicare HMO | Attending: Hematology

## 2020-07-19 VITALS — BP 145/62 | HR 76 | Temp 97.1°F | Resp 18 | Ht <= 58 in | Wt 113.4 lb

## 2020-07-19 DIAGNOSIS — C9110 Chronic lymphocytic leukemia of B-cell type not having achieved remission: Secondary | ICD-10-CM | POA: Insufficient documentation

## 2020-07-19 DIAGNOSIS — D696 Thrombocytopenia, unspecified: Secondary | ICD-10-CM

## 2020-07-19 DIAGNOSIS — M8580 Other specified disorders of bone density and structure, unspecified site: Secondary | ICD-10-CM | POA: Diagnosis not present

## 2020-07-19 DIAGNOSIS — Z79899 Other long term (current) drug therapy: Secondary | ICD-10-CM | POA: Insufficient documentation

## 2020-07-19 DIAGNOSIS — Z800 Family history of malignant neoplasm of digestive organs: Secondary | ICD-10-CM | POA: Insufficient documentation

## 2020-07-19 DIAGNOSIS — Z87891 Personal history of nicotine dependence: Secondary | ICD-10-CM | POA: Diagnosis not present

## 2020-07-19 DIAGNOSIS — Z853 Personal history of malignant neoplasm of breast: Secondary | ICD-10-CM | POA: Insufficient documentation

## 2020-07-19 DIAGNOSIS — Z833 Family history of diabetes mellitus: Secondary | ICD-10-CM | POA: Insufficient documentation

## 2020-07-19 DIAGNOSIS — Z806 Family history of leukemia: Secondary | ICD-10-CM | POA: Diagnosis not present

## 2020-07-19 LAB — CMP (CANCER CENTER ONLY)
ALT: 6 U/L (ref 0–44)
AST: 32 U/L (ref 15–41)
Albumin: 3.4 g/dL — ABNORMAL LOW (ref 3.5–5.0)
Alkaline Phosphatase: 60 U/L (ref 38–126)
Anion gap: 8 (ref 5–15)
BUN: 13 mg/dL (ref 8–23)
CO2: 25 mmol/L (ref 22–32)
Calcium: 9.2 mg/dL (ref 8.9–10.3)
Chloride: 104 mmol/L (ref 98–111)
Creatinine: 1 mg/dL (ref 0.44–1.00)
GFR, Estimated: 56 mL/min — ABNORMAL LOW (ref >60)
Glucose, Bld: 88 mg/dL (ref 70–99)
Potassium: 4.9 mmol/L (ref 3.5–5.1)
Sodium: 137 mmol/L (ref 135–145)
Total Bilirubin: 0.4 mg/dL (ref 0.3–1.2)
Total Protein: 7.7 g/dL (ref 6.5–8.1)

## 2020-07-19 LAB — CBC WITH DIFFERENTIAL/PLATELET
Abs Immature Granulocytes: 0 10*3/uL (ref 0.00–0.07)
Basophils Absolute: 0 10*3/uL (ref 0.0–0.1)
Basophils Relative: 0 %
Eosinophils Absolute: 0 10*3/uL (ref 0.0–0.5)
Eosinophils Relative: 0 %
HCT: 33.4 % — ABNORMAL LOW (ref 36.0–46.0)
Hemoglobin: 10.9 g/dL — ABNORMAL LOW (ref 12.0–15.0)
Lymphocytes Relative: 91 %
Lymphs Abs: 17.6 10*3/uL — ABNORMAL HIGH (ref 0.7–4.0)
MCH: 30.8 pg (ref 26.0–34.0)
MCHC: 32.6 g/dL (ref 30.0–36.0)
MCV: 94.4 fL (ref 80.0–100.0)
Monocytes Absolute: 0.2 10*3/uL (ref 0.1–1.0)
Monocytes Relative: 1 %
Neutro Abs: 1.5 10*3/uL — ABNORMAL LOW (ref 1.7–7.7)
Neutrophils Relative %: 8 %
Platelets: 78 10*3/uL — ABNORMAL LOW (ref 150–400)
RBC: 3.54 MIL/uL — ABNORMAL LOW (ref 3.87–5.11)
RDW: 15.4 % (ref 11.5–15.5)
WBC: 19.3 10*3/uL — ABNORMAL HIGH (ref 4.0–10.5)
nRBC: 0 % (ref 0.0–0.2)

## 2020-07-19 LAB — LACTATE DEHYDROGENASE: LDH: 625 U/L — ABNORMAL HIGH (ref 98–192)

## 2020-07-24 ENCOUNTER — Telehealth: Payer: Self-pay | Admitting: Hematology

## 2020-07-24 NOTE — Telephone Encounter (Signed)
Scheduled follow-up appointment per 5/6 los. Patient is aware. 

## 2020-08-09 ENCOUNTER — Encounter: Payer: Self-pay | Admitting: Hematology

## 2020-11-20 ENCOUNTER — Inpatient Hospital Stay: Payer: Medicare HMO

## 2020-11-20 ENCOUNTER — Inpatient Hospital Stay: Payer: Medicare HMO | Attending: Hematology | Admitting: Hematology

## 2020-11-20 DIAGNOSIS — D696 Thrombocytopenia, unspecified: Secondary | ICD-10-CM | POA: Insufficient documentation

## 2020-11-20 DIAGNOSIS — Z8572 Personal history of non-Hodgkin lymphomas: Secondary | ICD-10-CM | POA: Insufficient documentation

## 2020-11-20 DIAGNOSIS — C9110 Chronic lymphocytic leukemia of B-cell type not having achieved remission: Secondary | ICD-10-CM | POA: Insufficient documentation

## 2020-11-20 DIAGNOSIS — Z87891 Personal history of nicotine dependence: Secondary | ICD-10-CM | POA: Insufficient documentation

## 2020-11-20 DIAGNOSIS — Z79899 Other long term (current) drug therapy: Secondary | ICD-10-CM | POA: Insufficient documentation

## 2020-11-20 DIAGNOSIS — Z853 Personal history of malignant neoplasm of breast: Secondary | ICD-10-CM | POA: Insufficient documentation

## 2020-12-06 ENCOUNTER — Other Ambulatory Visit: Payer: Self-pay

## 2020-12-06 DIAGNOSIS — C9110 Chronic lymphocytic leukemia of B-cell type not having achieved remission: Secondary | ICD-10-CM

## 2020-12-09 ENCOUNTER — Inpatient Hospital Stay: Payer: Medicare HMO

## 2020-12-09 ENCOUNTER — Other Ambulatory Visit: Payer: Self-pay

## 2020-12-09 ENCOUNTER — Inpatient Hospital Stay (HOSPITAL_BASED_OUTPATIENT_CLINIC_OR_DEPARTMENT_OTHER): Payer: Medicare HMO | Admitting: Hematology

## 2020-12-09 VITALS — BP 134/60 | HR 77 | Temp 97.8°F | Resp 16 | Ht <= 58 in | Wt 106.3 lb

## 2020-12-09 DIAGNOSIS — C9110 Chronic lymphocytic leukemia of B-cell type not having achieved remission: Secondary | ICD-10-CM | POA: Diagnosis not present

## 2020-12-09 DIAGNOSIS — D696 Thrombocytopenia, unspecified: Secondary | ICD-10-CM | POA: Diagnosis not present

## 2020-12-09 DIAGNOSIS — Z87891 Personal history of nicotine dependence: Secondary | ICD-10-CM | POA: Diagnosis not present

## 2020-12-09 DIAGNOSIS — Z853 Personal history of malignant neoplasm of breast: Secondary | ICD-10-CM | POA: Diagnosis not present

## 2020-12-09 DIAGNOSIS — Z79899 Other long term (current) drug therapy: Secondary | ICD-10-CM | POA: Diagnosis not present

## 2020-12-09 DIAGNOSIS — Z8572 Personal history of non-Hodgkin lymphomas: Secondary | ICD-10-CM | POA: Diagnosis not present

## 2020-12-09 LAB — CBC WITH DIFFERENTIAL (CANCER CENTER ONLY)
Abs Immature Granulocytes: 0.08 10*3/uL — ABNORMAL HIGH (ref 0.00–0.07)
Basophils Absolute: 0.1 10*3/uL (ref 0.0–0.1)
Basophils Relative: 0 %
Eosinophils Absolute: 0.3 10*3/uL (ref 0.0–0.5)
Eosinophils Relative: 1 %
HCT: 34.5 % — ABNORMAL LOW (ref 36.0–46.0)
Hemoglobin: 11.3 g/dL — ABNORMAL LOW (ref 12.0–15.0)
Immature Granulocytes: 0 %
Lymphocytes Relative: 75 %
Lymphs Abs: 19.7 10*3/uL — ABNORMAL HIGH (ref 0.7–4.0)
MCH: 31.9 pg (ref 26.0–34.0)
MCHC: 32.8 g/dL (ref 30.0–36.0)
MCV: 97.5 fL (ref 80.0–100.0)
Monocytes Absolute: 4.1 10*3/uL — ABNORMAL HIGH (ref 0.1–1.0)
Monocytes Relative: 16 %
Neutro Abs: 2.2 10*3/uL (ref 1.7–7.7)
Neutrophils Relative %: 8 %
Platelet Count: 74 10*3/uL — ABNORMAL LOW (ref 150–400)
RBC: 3.54 MIL/uL — ABNORMAL LOW (ref 3.87–5.11)
RDW: 16.9 % — ABNORMAL HIGH (ref 11.5–15.5)
Smear Review: NORMAL
WBC Count: 26.4 10*3/uL — ABNORMAL HIGH (ref 4.0–10.5)
nRBC: 0.1 % (ref 0.0–0.2)

## 2020-12-09 LAB — CMP (CANCER CENTER ONLY)
ALT: 9 U/L (ref 0–44)
AST: 45 U/L — ABNORMAL HIGH (ref 15–41)
Albumin: 3.1 g/dL — ABNORMAL LOW (ref 3.5–5.0)
Alkaline Phosphatase: 71 U/L (ref 38–126)
Anion gap: 8 (ref 5–15)
BUN: 14 mg/dL (ref 8–23)
CO2: 23 mmol/L (ref 22–32)
Calcium: 8.8 mg/dL — ABNORMAL LOW (ref 8.9–10.3)
Chloride: 104 mmol/L (ref 98–111)
Creatinine: 1.03 mg/dL — ABNORMAL HIGH (ref 0.44–1.00)
GFR, Estimated: 54 mL/min — ABNORMAL LOW (ref >60)
Glucose, Bld: 87 mg/dL (ref 70–99)
Potassium: 4.6 mmol/L (ref 3.5–5.1)
Sodium: 135 mmol/L (ref 135–145)
Total Bilirubin: 0.5 mg/dL (ref 0.3–1.2)
Total Protein: 8.7 g/dL — ABNORMAL HIGH (ref 6.5–8.1)

## 2020-12-09 LAB — LACTATE DEHYDROGENASE: LDH: 790 U/L — ABNORMAL HIGH (ref 98–192)

## 2020-12-15 DIAGNOSIS — C9110 Chronic lymphocytic leukemia of B-cell type not having achieved remission: Secondary | ICD-10-CM | POA: Insufficient documentation

## 2020-12-15 NOTE — Progress Notes (Signed)
HEMATOLOGY/ONCOLOGY CLINIC NOTE  Date of Service: .12/09/2020   Patient Care Team: [name], MD as PCP - General (Internal Medicine)  CHIEF COMPLAINTS/PURPOSE OF CONSULTATION:  Chronic Lymphocytic Leukemia  Oncologic History:  Oncology History  Breast cancer of upper-outer quadrant of left female breast ([place])  09/16/2010 Surgery   Left breast 1.5 cm invasive ductal carcinoma with calcifications, grade 1, perineural invasion was seen, DCIS, margins negative, 2 sentinel nodes negative, ER 99%, PR negative, Ki-67 79%, HER-2 negative, Oncotype 24, 16% ROR   10/25/2010 -  Anti-estrogen oral therapy   Arimidex 1 mg daily developed side effects and was held briefly and restarted   Lymphoma, small lymphocytic ([place])  11/16/2014 Initial Diagnosis   Lymphoma, small lymphocytic: Based on cervical lymph node biopsy CD5 positive CD10 negative     HISTORY OF PRESENTING ILLNESS:   Brianna York is a wonderful 86 y.o. female who has been referred to [place] by my colleague Dr. Nicholas Lose for evaluation and management of Chronic Lymphocytic Leukemia. The pt reports that she is doing well overall.  The pt was initially diagnosed with SLL/CLL in 2016 after a cervical lymph node biopsy. The pt reports that she "wears out easy, a little tired, otherwise active." She notes that she takes a nap in the afternoon but notes this could be attributable to her age. She denies her fatigue interrupting her daily life. The pt notes that she has lost about 10 pounds over the last 4 years. She notes that her appetite "has probably changed some," and notes that she eats less, noting she "fills up faster." The pt denies any fevers, chills, or night sweats. She continues to enjoy eating and is not food averse.   The pt notes that she senses some lumps under her neck and on either side of her neck, which have grown gradually and do not bother her. She denies SOB, abdominal fullness or pains, and denies leg swelling.  However, she has recently been taking antibiotics for H.pylori. She denies skin rashes.  She notes that she is unsure if she is continuing to take Anastrozole, which she began in 2012. She notes that she takes care of herself in her home. Her brother lives down the street and checks in on her every now and then. She lives in her own home at this time and endorses being able to function well.  Most recent lab results (05/23/18) of CBC w/diff and CMP is as follows: all values are WNL except for WBC at 32k, PLT at 112k, Lymphs abs at 27.4k, Monocytes abs at 2.1k, GFR at 53. 05/23/18 LDH at 444  On review of systems, pt reports gradual neck lumps, functioning well, mostly stable energy levels with some occasional tiredness, chronic back pain, and denies fevers, chills, night sweats, new fatigue, unexpected weight loss, armpit or groin swelling, CP, changes in breathing, SOB, abdominal pains, abdominal fullness, upper abdominal pains, leg swelling, calf swelling, skin rashes, and any other symptoms.  On PMHx the pt reports breast cancer diagnosed in 2012, CLL/SLL. On Family Hx the pt reports Father died of acute leukemia.  Interval History:   Brianna York returns today for management and evaluation of her CLL. The patient's last visit with [place] was on 07/19/2020. The pt reports that she is doing well overall.  The is accompanied by one of her family member.  She notes continued issues with memory.  No new fevers no chills no night sweats no unexpected weight loss. Notes  that her eye swelling has slightly come back Lab results today 9/216/2022 reviewed with the patient.  MEDICAL HISTORY:  Past Medical History:  Diagnosis Date   Adenomatous colon polyp    tubular   Arthritis    osteo arthritis   Breast cancer ([place]) 02/16/2011   Diverticulosis    GERD (gastroesophageal reflux disease)    Hyperlipidemia    Leukemia ([place])    Lymphoma ([place])    small lympocytic   Osteopenia     SURGICAL  HISTORY: Past Surgical History:  Procedure Laterality Date   bartholian cyst  1986   BREAST LUMPECTOMY Left 09-16-2010   CATARACT EXTRACTION, [place] ARTHROSCOPY Right 2010   LYMPH GLAND EXCISION Right 11/16/2014   Procedure: EXCISION AND BIOPSY OF RIGHT CERVICAL LYMPH NODE;  Surgeon: Jackolyn Confer, MD;  Location: [facility];  Service: General;  Laterality: Right;   TONSILLECTOMY  1940    SOCIAL HISTORY: Social History   Socioeconomic History   Marital status: Widowed    Spouse name: Not on file   Number of children: 5   Years of education: Not on file   Highest education level: Not on file  Occupational History   Occupation: retired  Tobacco Use   Smoking status: Former    Types: Cigarettes    Quit date: 1989    Years since quitting: 33.7   Smokeless tobacco: Never   Tobacco comments:    Quit smoking 27 years ago  Scientific laboratory technician Use: Never used  Substance and Sexual Activity   Alcohol use: Yes    Comment: occasional glass wine   Drug use: No   Sexual activity: Not Currently  Other Topics Concern   Not on file  Social History Narrative   Not on file   Social Determinants of Health   Financial Resource Strain: Not on file  Food Insecurity: Not on file  Transportation Needs: Not on file  Physical Activity: Not on file  Stress: Not on file  Social Connections: Not on file  Intimate Partner Violence: Not on file    FAMILY HISTORY: Family History  Problem Relation Age of Onset   Colon cancer Mother 74   Colon cancer Maternal Aunt        had colon sugery   Leukemia Father    Diabetes Brother    Diabetes Brother     ALLERGIES:  is allergic to ciprofloxacin, loratadine-pseudoephedrine er, penicillins, and tolterodine tartrate.  MEDICATIONS:  Current Outpatient Medications  Medication Sig Dispense Refill   alendronate (FOSAMAX) 70 MG tablet Take 70 mg by mouth once a week. Take with a full  glass of water on an empty stomach.     atorvastatin (LIPITOR) 10 MG tablet Take 10 mg by mouth daily.     Vitamin D, Ergocalciferol, (DRISDOL) 50000 UNITS CAPS capsule Take 50,000 Units by mouth every 7 (seven) days.     No current facility-administered medications for this visit.    REVIEW OF SYSTEMS:   .10 Point review of Systems was done is negative except as noted above.   PHYSICAL EXAMINATION: ECOG PERFORMANCE STATUS: 1 - Symptomatic but completely ambulatory  . Vitals:   12/09/20 1515  BP: 134/60  Pulse: 77  Resp: 16  Temp: 97.8 F (36.6 C)  SpO2: 99%   Filed Weights   12/09/20 1515  Weight: 106 lb 4.8 oz (48.2 kg)   .Body mass index is  23.83 kg/m.  Marland Kitchen GENERAL:alert, in no acute distress and comfortable SKIN: no acute rashes, no significant lesions EYES: conjunctiva are pink and non-injected, sclera anicteric OROPHARYNX: MMM, no exudates, no oropharyngeal erythema or ulceration NECK: supple, no JVD LYMPH:  no palpable lymphadenopathy in the cervical, axillary or inguinal regions LUNGS: clear to auscultation b/l with normal respiratory effort HEART: regular rate & rhythm ABDOMEN:  normoactive bowel sounds , non tender, not distended. Extremity: no pedal edema PSYCH: alert & oriented x 3 with fluent speech NEURO: no focal motor/sensory deficits  LABORATORY DATA:  I have reviewed the data as listed  . CBC Latest Ref Rng & Units 12/09/2020 07/19/2020 03/22/2020  WBC 4.0 - 10.5 K/uL 26.4(H) 19.3(H) 23.2(H)  Hemoglobin 12.0 - 15.0 g/dL 11.3(L) 10.9(L) 10.8(L)  Hematocrit 36.0 - 46.0 % 34.5(L) 33.4(L) 34.2(L)  Platelets 150 - 400 K/uL 74(L) 78(L) 80(L)    . CMP Latest Ref Rng & Units 12/09/2020 07/19/2020 03/22/2020  Glucose 70 - 99 mg/dL 87 88 87  BUN 8 - 23 mg/dL 14 13 16   Creatinine 0.44 - 1.00 mg/dL 1.03(H) 1.00 0.96  Sodium 135 - 145 mmol/L 135 137 137  Potassium 3.5 - 5.1 mmol/L 4.6 4.9 4.5  Chloride 98 - 111 mmol/L 104 104 107  CO2 22 - 32 mmol/L 23 25 23    Calcium 8.9 - 10.3 mg/dL 8.8(L) 9.2 8.8(L)  Total Protein 6.5 - 8.1 g/dL 8.7(H) 7.7 7.7  Total Bilirubin 0.3 - 1.2 mg/dL 0.5 0.4 0.5  Alkaline Phos 38 - 126 U/L 71 60 64  AST 15 - 41 U/L 45(H) 32 29  ALT 0 - 44 U/L 9 6 8    Lab Results  Component Value Date   LDH 790 (H) 12/09/2020     Surgical Pathology  CASE: [id number]  PATIENT: Qunisha Schadt  Flow Pathology Report      Clinical history: right orbital mass   DIAGNOSIS:   - Monoclonal B-cell population identified, see comment.   COMMENT:   There is a monoclonal population of B-cells with expression of CD5 and  CD200. The phenotype is consistent with the patient's history of small  lymphocytic lymphoma.   02/21/18 Left Lacrimal sac biopsy:    12/18/14 Cytogenetics:    12/04/14 Right cervical lymph node biopsy:   06/27/18 FISH CLL Prognostic Panel:    RADIOGRAPHIC STUDIES: I have personally reviewed the radiological images as listed and agreed with the findings in the report. No results found.  ASSESSMENT & PLAN:   86 y.o. female with  1. Chronic Lymphocytic Leukemia 11/16/14 Right cervical lymph node biopsy revealed SLL/CLL 12/18/14 Cytogenetics revealed 59% of cells with gain of CEP12 and 5% of cells with loss of ATM 02/21/18 Right lacrimal gland revealed lymphoid infiltrates 06/27/18 FISH CLL Prognostic Panel revealed 49.33% of cells with Trisomy 12, and only 2% of cells with 17p mutation  08/03/18 PET/CT revealed "Exam positive for FDG avid adenopathy within the neck, chest, abdomen and pelvis compatible with the clinical history of chronic lymphocytic leukemia. 2. Splenomegaly (about 13cm) with mild increased radiotracer uptake throughout the spleen (above blood pool activity)".   2. History of Invasive Ductal Carcinoma 09/16/10 Left breast 1.5cm IDC with calcifications, grade 1, perineural invasion. DCIS margins negative, 2 sentinel nodes negative, ER 99%, PR negative, Ki-67 at 79%, HER-2 negative,  Oncotype 24, 16% ROR Continues on 30m Anastrozole daily, began on 10/25/10. Last available Mammogram on 12/31/16 negative  3.  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  Recent right  orbital biopsy also showed CLL/SLL.  4.  Thrombocytopenia related to CLL stable platelets in the 80k range.  PLAN:  -Discussed pt labwork today, patient with gradual increase in WBC count to 26.4k, mild anemia with a hemoglobin of 11.3, platelets low but stable at 74k LDH remains elevated. -We did discussed that she is having memory issues" she was recommended to follow-up with her primary care physician regarding this --No marked clinical evidence of CLL/SLL progression however with her thrombocytopenia and slight increasing eye swelling she would warrant consideration of treatment. -Patient wants to hold off on starting CLL treatment at this time given her other medical priorities including memory issues and general frailty. -She wants to continue to keep an eye on things with regards to her CLL. .-Advised pt we will watch symptoms to observe if further treatment is needed -Continue Vitamin B-Complex daily. -Will see back in 4 months with labs.   FOLLOW UP: RTC with Dr Irene Limbo with labs in 4 months   . The total time spent in the appointment was 20 minutes and more than 50% was on counseling and direct patient cares.   All of the patient's questions were answered with apparent satisfaction. The patient knows to call the clinic with any problems, questions or concerns.    Sullivan Lone MD [place] AAHIVMS [facility] Hematology/Oncology Physician [facility]

## 2021-01-27 ENCOUNTER — Other Ambulatory Visit: Payer: Self-pay | Admitting: Family Medicine

## 2021-04-07 ENCOUNTER — Other Ambulatory Visit: Payer: Self-pay

## 2021-04-07 DIAGNOSIS — C9110 Chronic lymphocytic leukemia of B-cell type not having achieved remission: Secondary | ICD-10-CM

## 2021-04-08 ENCOUNTER — Other Ambulatory Visit (HOSPITAL_COMMUNITY): Payer: Self-pay

## 2021-04-08 ENCOUNTER — Inpatient Hospital Stay: Payer: Medicare HMO | Attending: Hematology

## 2021-04-08 ENCOUNTER — Other Ambulatory Visit: Payer: Self-pay

## 2021-04-08 ENCOUNTER — Telehealth: Payer: Self-pay | Admitting: Pharmacist

## 2021-04-08 ENCOUNTER — Telehealth: Payer: Self-pay

## 2021-04-08 ENCOUNTER — Inpatient Hospital Stay (HOSPITAL_BASED_OUTPATIENT_CLINIC_OR_DEPARTMENT_OTHER): Payer: Medicare HMO | Admitting: Hematology

## 2021-04-08 VITALS — BP 124/55 | HR 88 | Temp 98.1°F | Resp 18 | Wt 106.1 lb

## 2021-04-08 DIAGNOSIS — C9110 Chronic lymphocytic leukemia of B-cell type not having achieved remission: Secondary | ICD-10-CM | POA: Insufficient documentation

## 2021-04-08 DIAGNOSIS — Z87891 Personal history of nicotine dependence: Secondary | ICD-10-CM | POA: Diagnosis not present

## 2021-04-08 DIAGNOSIS — Z79811 Long term (current) use of aromatase inhibitors: Secondary | ICD-10-CM | POA: Insufficient documentation

## 2021-04-08 DIAGNOSIS — C8300 Small cell B-cell lymphoma, unspecified site: Secondary | ICD-10-CM

## 2021-04-08 DIAGNOSIS — Z79899 Other long term (current) drug therapy: Secondary | ICD-10-CM | POA: Diagnosis not present

## 2021-04-08 DIAGNOSIS — D696 Thrombocytopenia, unspecified: Secondary | ICD-10-CM | POA: Diagnosis not present

## 2021-04-08 DIAGNOSIS — C50412 Malignant neoplasm of upper-outer quadrant of left female breast: Secondary | ICD-10-CM | POA: Diagnosis not present

## 2021-04-08 DIAGNOSIS — Z170 Estrogen receptor positive status [ER+]: Secondary | ICD-10-CM | POA: Insufficient documentation

## 2021-04-08 LAB — CBC WITH DIFFERENTIAL (CANCER CENTER ONLY)
Abs Immature Granulocytes: 0.09 10*3/uL — ABNORMAL HIGH (ref 0.00–0.07)
Basophils Absolute: 0.1 10*3/uL (ref 0.0–0.1)
Basophils Relative: 0 %
Eosinophils Absolute: 0.2 10*3/uL (ref 0.0–0.5)
Eosinophils Relative: 1 %
HCT: 29.3 % — ABNORMAL LOW (ref 36.0–46.0)
Hemoglobin: 9.7 g/dL — ABNORMAL LOW (ref 12.0–15.0)
Immature Granulocytes: 0 %
Lymphocytes Relative: 82 %
Lymphs Abs: 31.3 10*3/uL — ABNORMAL HIGH (ref 0.7–4.0)
MCH: 33.4 pg (ref 26.0–34.0)
MCHC: 33.1 g/dL (ref 30.0–36.0)
MCV: 101 fL — ABNORMAL HIGH (ref 80.0–100.0)
Monocytes Absolute: 4.5 10*3/uL — ABNORMAL HIGH (ref 0.1–1.0)
Monocytes Relative: 12 %
Neutro Abs: 2 10*3/uL (ref 1.7–7.7)
Neutrophils Relative %: 5 %
Platelet Count: 57 10*3/uL — ABNORMAL LOW (ref 150–400)
RBC: 2.9 MIL/uL — ABNORMAL LOW (ref 3.87–5.11)
RDW: 17.8 % — ABNORMAL HIGH (ref 11.5–15.5)
Smear Review: NORMAL
WBC Count: 38.2 10*3/uL — ABNORMAL HIGH (ref 4.0–10.5)
nRBC: 0.1 % (ref 0.0–0.2)

## 2021-04-08 LAB — CMP (CANCER CENTER ONLY)
ALT: 8 U/L (ref 0–44)
AST: 43 U/L — ABNORMAL HIGH (ref 15–41)
Albumin: 3 g/dL — ABNORMAL LOW (ref 3.5–5.0)
Alkaline Phosphatase: 64 U/L (ref 38–126)
Anion gap: 8 (ref 5–15)
BUN: 12 mg/dL (ref 8–23)
CO2: 23 mmol/L (ref 22–32)
Calcium: 8.6 mg/dL — ABNORMAL LOW (ref 8.9–10.3)
Chloride: 105 mmol/L (ref 98–111)
Creatinine: 0.88 mg/dL (ref 0.44–1.00)
GFR, Estimated: >60 mL/min (ref >60)
Glucose, Bld: 88 mg/dL (ref 70–99)
Potassium: 4.5 mmol/L (ref 3.5–5.1)
Sodium: 136 mmol/L (ref 135–145)
Total Bilirubin: 0.5 mg/dL (ref 0.3–1.2)
Total Protein: 8.3 g/dL — ABNORMAL HIGH (ref 6.5–8.1)

## 2021-04-08 LAB — LACTATE DEHYDROGENASE: LDH: 667 U/L — ABNORMAL HIGH (ref 98–192)

## 2021-04-08 MED ORDER — DOXYCYCLINE HYCLATE 100 MG PO TABS: 100.0000 mg | ORAL_TABLET | Freq: Two times a day (BID) | ORAL | 0 refills | Status: DC

## 2021-04-08 MED ORDER — VALACYCLOVIR HCL 500 MG PO TABS: 500.0000 mg | ORAL_TABLET | Freq: Two times a day (BID) | ORAL | 0 refills | Status: DC

## 2021-04-08 MED ORDER — IBRUTINIB 140 MG PO CAPS
140.0000 mg | ORAL_CAPSULE | Freq: Every day | ORAL | 1 refills | Status: DC
  Filled 2021-04-08 – 2021-04-10 (×2): qty 30, 30d supply, fill #0

## 2021-04-08 NOTE — Telephone Encounter (Signed)
Obtained an Imbruvica free trial card for patient   [facility] [place] [id number] ID [id number]  [place] Patient [place] Phone [phone] Fax [phone] 04/08/2021 4:02 PM

## 2021-04-09 ENCOUNTER — Telehealth: Payer: Self-pay

## 2021-04-09 ENCOUNTER — Telehealth: Payer: Self-pay | Admitting: Hematology

## 2021-04-09 ENCOUNTER — Other Ambulatory Visit (HOSPITAL_COMMUNITY): Payer: Self-pay

## 2021-04-09 NOTE — Telephone Encounter (Signed)
Oral Oncology Patient Advocate Encounter   Received notification from [employer] that prior authorization for Imbruvica is required.   PA submitted on CoverMyMeds Key [id number] Status is pending   Oral Oncology Clinic will continue to follow.  [place] Patient Brianna York Phone [phone] Fax [phone] 04/09/2021 9:32 AM

## 2021-04-09 NOTE — Telephone Encounter (Signed)
Oral Oncology Patient Advocate Encounter  Prior Authorization for Brianna York has been approved.    PA# [id number] Effective dates: 04/09/21 through 04/09/22  Patient must use [facility] will continue to follow.   [place] Patient [name] Phone [phone] Fax [phone] 04/09/2021 11:40 AM

## 2021-04-09 NOTE — Telephone Encounter (Signed)
Oral Oncology Pharmacist Encounter  Received new prescription for Imbruvica (ibrutinib) for the treatment of CLL, planned duration until disease progression or unacceptable drug toxicity.  CBC w/ Diff, CMP and LDH from 04/08/21 assessed, noted LDH elevated at 667 U/L, WBC 38.2 K/uL, Hgb 9.7 g/dL and pltc 57 K/uL. Prescription dose and frequency assessed for appropriateness. Dosing per MD.   Current medication list in Epic reviewed, no relevant/significant DDIs with Imbruvica identified.  Evaluated chart and no patient barriers to medication adherence noted.   Prescription has been e-scribed to the [facility] for benefits analysis and approval.  Oral Oncology Clinic will continue to follow for insurance authorization, copayment issues, initial counseling and start date.  Leron Croak, PharmD, BCPS Hematology/Oncology Clinical Pharmacist [name] [place] [phone] 04/09/2021 12:03 PM

## 2021-04-09 NOTE — Telephone Encounter (Signed)
Scheduled follow-up appointment per 1/24 los. Patient is aware.

## 2021-04-10 ENCOUNTER — Other Ambulatory Visit (HOSPITAL_COMMUNITY): Payer: Self-pay

## 2021-04-10 NOTE — Telephone Encounter (Signed)
Oral Chemotherapy Pharmacist Encounter  I spoke with patient for overview of: Imbruvica (ibrutinib) for the treatment of CLL, planned duration until disease progression or unacceptable toxicity.   Counseled patient on administration, dosing, side effects, monitoring, drug-food interactions, safe handling, storage, and disposal.  Patient will take Imbruvica 140mg  capsules, 1 capsule (140mg ) by mouth once daily. Based on patient tolerance, dose may be slowly escalated to goal dose of 420 mg/d.  Patient will take Imbruvica at approximately the same time each day with a full glass of water and maintain adequate hydration throughout the day.  Patient knows to avoid grapefruit or grapefruit juice while on therapy with Imbruvica.  Imbruvica start date: 04/12/21  Adverse effects include but are not limited to: bruising, decreased blood counts, diarrhea, musculoskeletal pain, arthralgias, peripheral edema, and hemorrhage.    Patient will obtain anti diarrheal and alert the office of 4 or more loose stools above baseline.  Reviewed with patient importance of keeping a medication schedule and plan for any missed doses. No barriers to medication adherence identified.  Medication reconciliation performed and medication/allergy list updated.  Insurance authorization for Kate Sable has been obtained. We will use 1 time free voucher for first fill through the [facility] [facility].  This will ship from the [place] on 04/10/21 to deliver to patient's home on 04/11/21.  Subsequent fills are required to be filled through [facility]. We will redirect the remaining refill to [facility] once first fill is dispensed.  All questions answered.  Ms. Marinos voiced understanding and appreciation.   Medication education handout placed in mail for patient. Patient knows to call the office with questions or concerns. Oral Chemotherapy Clinic phone number provided  to patient.   Leron Croak, PharmD, BCPS Hematology/Oncology Clinical Pharmacist [name] [place] [phone] 04/10/2021 12:48 PM

## 2021-04-14 MED ORDER — IBRUTINIB 140 MG PO CAPS: 140.0000 mg | ORAL_CAPSULE | Freq: Every day | ORAL | 0 refills | Status: DC

## 2021-04-14 NOTE — Progress Notes (Addendum)
HEMATOLOGY/ONCOLOGY CLINIC NOTE  Date of Service: .04/08/2021   Patient Care Team: [name], MD as PCP - General (Internal Medicine)  CHIEF COMPLAINTS/PURPOSE OF CONSULTATION:  Follow-up for continued evaluation and management of CLL/SLL  Oncologic History:  Oncology History  Breast cancer of upper-outer quadrant of left female breast ([place])  09/16/2010 Surgery   Left breast 1.5 cm invasive ductal carcinoma with calcifications, grade 1, perineural invasion was seen, DCIS, margins negative, 2 sentinel nodes negative, ER 99%, PR negative, Ki-67 79%, HER-2 negative, Oncotype 24, 16% ROR   10/25/2010 -  Anti-estrogen oral therapy   Arimidex 1 mg daily developed side effects and was held briefly and restarted   Lymphoma, small lymphocytic ([place])  11/16/2014 Initial Diagnosis   Lymphoma, small lymphocytic: Based on cervical lymph node biopsy CD5 positive CD10 negative   CLL (chronic lymphocytic leukemia) ([place])  12/12/2020 Cancer Staging   Staging form: Chronic Lymphocytic Leukemia / Small Lymphocytic Lymphoma, AJCC 8th Edition - Clinical stage from 12/12/2020: Modified Rai Stage IV (Modified Rai risk: High, Lymphocytosis: Present, Adenopathy: Present, Organomegaly: Present, Anemia: Present, Thrombocytopenia: Present) - Signed by [name], MD on 12/15/2020 Stage prefix: Post-therapy Response to neoadjuvant therapy: Unknown    12/15/2020 Initial Diagnosis   CLL (chronic lymphocytic leukemia) ([place])     HISTORY OF PRESENTING ILLNESS:   Brianna York is a wonderful 87 y.o. female who has been referred to [place] by my colleague Dr. Nicholas Lose for evaluation and management of Chronic Lymphocytic Leukemia. The pt reports that she is doing well overall.  The pt was initially diagnosed with SLL/CLL in 2016 after a cervical lymph node biopsy. The pt reports that she "wears out easy, a little tired, otherwise active." She notes that she takes a nap in the afternoon but notes this  could be attributable to her age. She denies her fatigue interrupting her daily life. The pt notes that she has lost about 10 pounds over the last 4 years. She notes that her appetite "has probably changed some," and notes that she eats less, noting she "fills up faster." The pt denies any fevers, chills, or night sweats. She continues to enjoy eating and is not food averse.   The pt notes that she senses some lumps under her neck and on either side of her neck, which have grown gradually and do not bother her. She denies SOB, abdominal fullness or pains, and denies leg swelling. However, she has recently been taking antibiotics for H.pylori. She denies skin rashes.  She notes that she is unsure if she is continuing to take Anastrozole, which she began in 2012. She notes that she takes care of herself in her home. Her brother lives down the street and checks in on her every now and then. She lives in her own home at this time and endorses being able to function well.  Most recent lab results (05/23/18) of CBC w/diff and CMP is as follows: all values are WNL except for WBC at 32k, PLT at 112k, Lymphs abs at 27.4k, Monocytes abs at 2.1k, GFR at 53. 05/23/18 LDH at 444  On review of systems, pt reports gradual neck lumps, functioning well, mostly stable energy levels with some occasional tiredness, chronic back pain, and denies fevers, chills, night sweats, new fatigue, unexpected weight loss, armpit or groin swelling, CP, changes in breathing, SOB, abdominal pains, abdominal fullness, upper abdominal pains, leg swelling, calf swelling, skin rashes, and any other symptoms.  On PMHx the pt reports  breast cancer diagnosed in 2012, CLL/SLL. On Family Hx the pt reports Father died of acute leukemia.  Interval History:   Brianna York presents for continued evaluation and management of her CLL and is accompanied by her brother in person and son on the phone. She notes increasing or bilateral swelling  similar to her previous findings with CLL which had temporarily improved with radiation. She also notes increasing fatigue and lymphadenopathy into the armpits in the groin.  Also notes some abdominal fullness.  Labs done today show CBC with increasing anemia hemoglobin of 9.7, thrombocytopenia with platelets of 57k and WBC count of 38.2k. CMP with albumin of 3 AST of 43, creatinine 0.88. LDH elevated at 667.  Discussed findings of CLL progression.  Patient is agreeable with PET scan and further work-up of her CLL.  We also discussed potential treatment options and she would like to pursue oral targeted treatment with ibrutinib as opposed to fixed duration treatment with Gazyva plus venetoclax.   MEDICAL HISTORY:  Past Medical History:  Diagnosis Date   Adenomatous colon polyp    tubular   Arthritis    osteo arthritis   Breast cancer ([place]) 02/16/2011   Diverticulosis    GERD (gastroesophageal reflux disease)    Hyperlipidemia    Leukemia ([place])    Lymphoma ([place])    small lympocytic   Osteopenia     SURGICAL HISTORY: Past Surgical History:  Procedure Laterality Date   bartholian cyst  1986   BREAST LUMPECTOMY Left 09-16-2010   CATARACT EXTRACTION, [facility] ARTHROSCOPY Right 2010   LYMPH GLAND EXCISION Right 11/16/2014   Procedure: EXCISION AND BIOPSY OF RIGHT CERVICAL LYMPH NODE;  Surgeon: Jackolyn Confer, MD;  Location: [facility];  Service: General;  Laterality: Right;   TONSILLECTOMY  1940    SOCIAL HISTORY: Social History   Socioeconomic History   Marital status: Widowed    Spouse name: Not on file   Number of children: 5   Years of education: Not on file   Highest education level: Not on file  Occupational History   Occupation: retired  Tobacco Use   Smoking status: Former    Types: Cigarettes    Quit date: 1989    Years since quitting: 34.1   Smokeless tobacco: Never   Tobacco comments:    Quit  smoking 27 years ago  Scientific laboratory technician Use: Never used  Substance and Sexual Activity   Alcohol use: Yes    Comment: occasional glass wine   Drug use: No   Sexual activity: Not Currently  Other Topics Concern   Not on file  Social History Narrative   Not on file   Social Determinants of Health   Financial Resource Strain: Not on file  Food Insecurity: Not on file  Transportation Needs: Not on file  Physical Activity: Not on file  Stress: Not on file  Social Connections: Not on file  Intimate Partner Violence: Not on file    FAMILY HISTORY: Family History  Problem Relation Age of Onset   Colon cancer Mother 1   Colon cancer Maternal Aunt        had colon sugery   Leukemia Father    Diabetes Brother    Diabetes Brother     ALLERGIES:  is allergic to ciprofloxacin, loratadine-pseudoephedrine er, penicillins, and tolterodine tartrate.  MEDICATIONS:  Current Outpatient Medications  Medication Sig Dispense Refill  valACYclovir (VALTREX) 500 MG tablet Take 1 tablet (500 mg total) by mouth 2 (two) times daily for 7 days. 14 tablet 0   alendronate (FOSAMAX) 70 MG tablet Take 70 mg by mouth once a week. Take with a full glass of water on an empty stomach.     atorvastatin (LIPITOR) 10 MG tablet Take 10 mg by mouth daily.     ibrutinib (IMBRUVICA) 140 MG capsul Take 1 capsule (140 mg total) by mouth daily. 30 capsule 0   Vitamin D, Ergocalciferol, (DRISDOL) 50000 UNITS CAPS capsule Take 50,000 Units by mouth every 7 (seven) days.     No current facility-administered medications for this visit.    REVIEW OF SYSTEMS:   .10 Point review of Systems was done is negative except as noted above.  PHYSICAL EXAMINATION: ECOG PERFORMANCE STATUS: 2  . Vitals:   04/08/21 1423  BP: (!) 124/55  Pulse: 88  Resp: 18  Temp: 98.1 F (36.7 C)  SpO2: 97%   Filed Weights   04/08/21 1423  Weight: 106 lb 1.6 oz (48.1 kg)   .Body mass index is 23.79 kg/m.  Marland Kitchen GENERAL:alert, in  no acute distress and comfortable SKIN: no acute rashes, no significant lesions EYES: conjunctiva are pink and non-injected, sclera anicteric OROPHARYNX: MMM, no exudates, no oropharyngeal erythema or ulceration NECK: supple, no JVD LYMPH: Palpable lower cervical and axillary adenopathy LUNGS: clear to auscultation b/l with normal respiratory effort HEART: regular rate & rhythm ABDOMEN:  normoactive bowel sounds , non tender, palpable moderate splenomegaly Extremity: no pedal edema PSYCH: alert & oriented x 3 with fluent speech NEURO: no focal motor/sensory deficits  LABORATORY DATA:  I have reviewed the data as listed  . CBC Latest Ref Rng & Units 04/08/2021 12/09/2020 07/19/2020  WBC 4.0 - 10.5 K/uL 38.2(H) 26.4(H) 19.3(H)  Hemoglobin 12.0 - 15.0 g/dL 9.7(L) 11.3(L) 10.9(L)  Hematocrit 36.0 - 46.0 % 29.3(L) 34.5(L) 33.4(L)  Platelets 150 - 400 K/uL 57(L) 74(L) 78(L)    . CMP Latest Ref Rng & Units 04/08/2021 12/09/2020 07/19/2020  Glucose 70 - 99 mg/dL 88 87 88  BUN 8 - 23 mg/dL 12 14 13   Creatinine 0.44 - 1.00 mg/dL 0.88 1.03(H) 1.00  Sodium 135 - 145 mmol/L 136 135 137  Potassium 3.5 - 5.1 mmol/L 4.5 4.6 4.9  Chloride 98 - 111 mmol/L 105 104 104  CO2 22 - 32 mmol/L 23 23 25   Calcium 8.9 - 10.3 mg/dL 8.6(L) 8.8(L) 9.2  Total Protein 6.5 - 8.1 g/dL 8.3(H) 8.7(H) 7.7  Total Bilirubin 0.3 - 1.2 mg/dL 0.5 0.5 0.4  Alkaline Phos 38 - 126 U/L 64 71 60  AST 15 - 41 U/L 43(H) 45(H) 32  ALT 0 - 44 U/L 8 9 6    Lab Results  Component Value Date   LDH 667 (H) 04/08/2021     Surgical Pathology  CASE: [id number]  PATIENT: Yamilee Barona  Flow Pathology Report      Clinical history: right orbital mass   DIAGNOSIS:   - Monoclonal B-cell population identified, see comment.   COMMENT:   There is a monoclonal population of B-cells with expression of CD5 and  CD200. The phenotype is consistent with the patient's history of small  lymphocytic lymphoma.   02/21/18 Left  Lacrimal sac biopsy:    12/18/14 Cytogenetics:    12/04/14 Right cervical lymph node biopsy:   06/27/18 FISH CLL Prognostic Panel:    RADIOGRAPHIC STUDIES: I have personally reviewed the radiological images as listed  and agreed with the findings in the report. No results found.  ASSESSMENT & PLAN:   87 y.o. female with  1. Chronic Lymphocytic Leukemia 11/16/14 Right cervical lymph node biopsy revealed SLL/CLL 12/18/14 Cytogenetics revealed 59% of cells with gain of CEP12 and 5% of cells with loss of ATM 02/21/18 Right lacrimal gland revealed lymphoid infiltrates 06/27/18 FISH CLL Prognostic Panel revealed 49.33% of cells with Trisomy 12, and only 2% of cells with 17p mutation  08/03/18 PET/CT revealed "Exam positive for FDG avid adenopathy within the neck, chest, abdomen and pelvis compatible with the clinical history of chronic lymphocytic leukemia. 2. Splenomegaly (about 13cm) with mild increased radiotracer uptake throughout the spleen (above blood pool activity)".   2. History of Invasive Ductal Carcinoma 09/16/10 Left breast 1.5cm IDC with calcifications, grade 1, perineural invasion. DCIS margins negative, 2 sentinel nodes negative, ER 99%, PR negative, Ki-67 at 79%, HER-2 negative, Oncotype 24, 16% ROR Continues on 48m Anastrozole daily, began on 10/25/10. Last available Mammogram on 12/31/16 negative  3.  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  Recent right orbital biopsy also showed CLL/SLL.  Has previously had radiation treatment with temporary improvement.  4.  Thrombocytopenia related to CLL platelets lower at 57k due to CLL and splenomegaly.  PLAN:  -Discussed with patient's lab results as noted above which shows increasing anemia and thrombocytopenia with increasing lymphocyte count consistent with CLL progression. -Patient has increasing orbital swelling, significant splenomegaly, increasing anemia and thrombocytopenia all of which would be  reasons to consider treatment of her CLL. -We discussed options of ibrutinib versus Gazyva plus venetoclax and patient prefers to go with only oral treatment at this time. -She is trying to balance treatment with her other medical issues including cognitive impairment. -Her brother in person and her son over the phone are helping her with decision-making. -We shall start on ibrutinib at a conservative dose of 140 mg p.o. daily progressively escalate the dose to the standard dose of 420 mg p.o. daily if tolerated. -Continue Vitamin B-Complex daily.  FOLLOW UP: Starting on ibrutinib in 2 weeks (oral chemotherapy) PET CT scan in 1 week Return to clinic with Dr. KIrene Limboin 3 weeks with labs  . The total time spent in the appointment was 32 mins on chart review, discussion of lab results, discussion of treatment options, ordering ibrutinib, coordination of care with pharmacist and documentation.  All of the patient's questions were answered with apparent satisfaction. The patient knows to call the clinic with any problems, questions or concerns.    GSullivan LoneMD MS AAHIVMS SA [facility] Surgery CenterCEye [facility] LLCHematology/Oncology Physician CAtrium Health [facility] ..Marland Kitchen

## 2021-04-14 NOTE — Telephone Encounter (Signed)
Oral Oncology Pharmacist Encounter  Notified by [facility] that they do not have access to Imbruvica, thus are unable to fill Rx. They were unable to provide which specialty pharmacy would be able to fill for patient. Will attempt to have [facility] fill Rx. Prescription redirected to [facility] for processing.  Leron Croak, PharmD, BCPS Hematology/Oncology Clinical Pharmacist [facility] [phone] 04/14/2021 3:51 PM

## 2021-04-14 NOTE — Addendum Note (Signed)
Addended byBritt Boozer on: 04/14/2021 03:54 PM   Modules accepted: Orders

## 2021-04-15 ENCOUNTER — Encounter: Payer: Self-pay | Admitting: Hematology

## 2021-04-15 ENCOUNTER — Telehealth: Payer: Self-pay | Admitting: Pharmacist

## 2021-04-15 ENCOUNTER — Other Ambulatory Visit (HOSPITAL_COMMUNITY): Payer: Self-pay

## 2021-04-15 NOTE — Telephone Encounter (Signed)
Oral Chemotherapy Pharmacist Encounter   Spoke with patient as well as follow up call made to patient's daughter to follow up regarding questions on patient's oral chemotherapy medication: Imbruvica (ibrutinib)  Reviewed again that patient will, at this time, take 140mg  capsules, 1 capsule (140mg ) by mouth once daily. Based on patient tolerance, dose may be slowly escalated to goal dose of 420 mg/d.  Patient knows to take medication at the same time each day and with a full glass of water in the morning. Reviewed with patient that she will need to avoid grapefruit or grapefruit juice while on therapy with Imbruvica.  Reviewed again possible adverse effects that include, but are not limited to bruising, decreased blood counts, diarrhea, musculoskeletal pain, arthralgias, peripheral edema, and hemorrhage.    Patient's daughter stated that Ms. Casale already has issues with diarrhea at baseline. We discussed alerting the office if Ms. Winslett has 4 or more episodes of diarrhea above her baseline as well as utilizing Imodium PRN.   Future fills of Imbruvica will be dispensed by [facility]. They have obtained a copay card through the manufacturer that will keep patient's out of pocket cost at $0 for [facility].   Patient and patient's daughter know to call the office with questions or concerns. I provided my direct phone number to the oral chemotherapy clinic to the patient's daughter if there are any more questions regarding the new medication or issues obtaining it from [facility].   Leron Croak, PharmD, BCPS Hematology/Oncology Clinical Pharmacist [name] [phone] 04/15/2021 1:40 PM

## 2021-04-22 ENCOUNTER — Ambulatory Visit (HOSPITAL_COMMUNITY)
Admission: RE | Admit: 2021-04-22 | Discharge: 2021-04-22 | Disposition: A | Discharge status: Home / Self Care | Payer: Medicare HMO | Source: Ambulatory Visit | Attending: Hematology | Admitting: Hematology

## 2021-04-22 ENCOUNTER — Other Ambulatory Visit: Payer: Self-pay

## 2021-04-22 DIAGNOSIS — C9110 Chronic lymphocytic leukemia of B-cell type not having achieved remission: Secondary | ICD-10-CM | POA: Insufficient documentation

## 2021-04-22 DIAGNOSIS — I517 Cardiomegaly: Secondary | ICD-10-CM | POA: Insufficient documentation

## 2021-04-22 DIAGNOSIS — I2510 Atherosclerotic heart disease of native coronary artery without angina pectoris: Secondary | ICD-10-CM | POA: Diagnosis not present

## 2021-04-22 DIAGNOSIS — J322 Chronic ethmoidal sinusitis: Secondary | ICD-10-CM | POA: Insufficient documentation

## 2021-04-22 DIAGNOSIS — J90 Pleural effusion, not elsewhere classified: Secondary | ICD-10-CM | POA: Insufficient documentation

## 2021-04-22 DIAGNOSIS — C8300 Small cell B-cell lymphoma, unspecified site: Secondary | ICD-10-CM

## 2021-04-22 LAB — GLUCOSE, CAPILLARY: Glucose-Capillary: 86 mg/dL (ref 70–99)

## 2021-04-22 MED ORDER — FLUDEOXYGLUCOSE F - 18 (FDG) INJECTION
5.2500 | Freq: Once | INTRAVENOUS | Status: AC | PRN
  Administered 2021-04-22: 5.25 via INTRAVENOUS

## 2021-04-28 ENCOUNTER — Other Ambulatory Visit: Payer: Self-pay | Admitting: *Deleted

## 2021-04-28 DIAGNOSIS — C9110 Chronic lymphocytic leukemia of B-cell type not having achieved remission: Secondary | ICD-10-CM

## 2021-04-29 ENCOUNTER — Inpatient Hospital Stay: Payer: Medicare HMO

## 2021-04-29 ENCOUNTER — Inpatient Hospital Stay: Payer: Medicare HMO | Attending: Hematology | Admitting: Hematology

## 2021-04-29 NOTE — Progress Notes (Incomplete)
HEMATOLOGY/ONCOLOGY CLINIC NOTE  Date of Service: 04/29/2021   Patient Care Team: Brianna Infante, MD as PCP - General (Internal Medicine)  CHIEF COMPLAINTS/PURPOSE OF CONSULTATION:  Follow-up for continued evaluation and management of CLL/SLL  Oncologic History:  Oncology History  Breast cancer of upper-outer quadrant of left female breast ([place])  09/16/2010 Surgery   Left breast 1.5 cm invasive ductal carcinoma with calcifications, grade 1, perineural invasion was seen, DCIS, margins negative, 2 sentinel nodes negative, ER 99%, PR negative, Ki-67 79%, HER-2 negative, Oncotype 24, 16% ROR   10/25/2010 -  Anti-estrogen oral therapy   Arimidex 1 mg daily developed side effects and was held briefly and restarted   Lymphoma, small lymphocytic ([place])  11/16/2014 Initial Diagnosis   Lymphoma, small lymphocytic: Based on cervical lymph node biopsy CD5 positive CD10 negative   CLL (chronic lymphocytic leukemia) ([place])  12/12/2020 Cancer Staging   Staging form: Chronic Lymphocytic Leukemia / Small Lymphocytic Lymphoma, AJCC 8th Edition - Clinical stage from 12/12/2020: Modified Rai Stage IV (Modified Rai risk: High, Lymphocytosis: Present, Adenopathy: Present, Organomegaly: Present, Anemia: Present, Thrombocytopenia: Present) - Signed by [name], MD on 12/15/2020 Stage prefix: Post-therapy Response to neoadjuvant therapy: Unknown    12/15/2020 Initial Diagnosis   CLL (chronic lymphocytic leukemia) ([place])     HISTORY OF PRESENTING ILLNESS:   Brianna York is a wonderful 87 y.o. female who has been referred to [place] by my colleague Dr. Nicholas Lose for evaluation and management of Chronic Lymphocytic Leukemia. The pt reports that she is doing well overall.  The pt was initially diagnosed with SLL/CLL in 2016 after a cervical lymph node biopsy. The pt reports that she "wears out easy, a little tired, otherwise active." She notes that she takes a nap in the afternoon but notes this  could be attributable to her age. She denies her fatigue interrupting her daily life. The pt notes that she has lost about 10 pounds over the last 4 years. She notes that her appetite "has probably changed some," and notes that she eats less, noting she "fills up faster." The pt denies any fevers, chills, or night sweats. She continues to enjoy eating and is not food averse.   The pt notes that she senses some lumps under her neck and on either side of her neck, which have grown gradually and do not bother her. She denies SOB, abdominal fullness or pains, and denies leg swelling. However, she has recently been taking antibiotics for H.pylori. She denies skin rashes.  She notes that she is unsure if she is continuing to take Anastrozole, which she began in 2012. She notes that she takes care of herself in her home. Her brother lives down the street and checks in on her every now and then. She lives in her own home at this time and endorses being able to function well.  Most recent lab results (05/23/18) of CBC w/diff and CMP is as follows: all values are WNL except for WBC at 32k, PLT at 112k, Lymphs abs at 27.4k, Monocytes abs at 2.1k, GFR at 53. 05/23/18 LDH at 444  On review of systems, pt reports gradual neck lumps, functioning well, mostly stable energy levels with some occasional tiredness, chronic back pain, and denies fevers, chills, night sweats, new fatigue, unexpected weight loss, armpit or groin swelling, CP, changes in breathing, SOB, abdominal pains, abdominal fullness, upper abdominal pains, leg swelling, calf swelling, skin rashes, and any other symptoms.  On PMHx the pt reports  breast cancer diagnosed in 2012, CLL/SLL. On Family Hx the pt reports Father died of acute leukemia.  Interval History:   Brianna York presents for continued evaluation and management of her CLL and is accompanied by her brother in person and son on the phone.***   MEDICAL HISTORY:  Past Medical History:   Diagnosis Date   Adenomatous colon polyp    tubular   Arthritis    osteo arthritis   Breast cancer ([place]) 02/16/2011   Diverticulosis    GERD (gastroesophageal reflux disease)    Hyperlipidemia    Leukemia ([place])    Lymphoma ([place])    small lympocytic   Osteopenia     SURGICAL HISTORY: Past Surgical History:  Procedure Laterality Date   bartholian cyst  1986   BREAST LUMPECTOMY Left 09-16-2010   CATARACT EXTRACTION, [place] ARTHROSCOPY Right 2010   LYMPH GLAND EXCISION Right 11/16/2014   Procedure: EXCISION AND BIOPSY OF RIGHT CERVICAL LYMPH NODE;  Surgeon: Jackolyn Confer, MD;  Location: [facility];  Service: General;  Laterality: Right;   TONSILLECTOMY  1940    SOCIAL HISTORY: Social History   Socioeconomic History   Marital status: Widowed    Spouse name: Not on file   Number of children: 5   Years of education: Not on file   Highest education level: Not on file  Occupational History   Occupation: retired  Tobacco Use   Smoking status: Former    Types: Cigarettes    Quit date: 1989    Years since quitting: 34.1   Smokeless tobacco: Never   Tobacco comments:    Quit smoking 27 years ago  Scientific laboratory technician Use: Never used  Substance and Sexual Activity   Alcohol use: Yes    Comment: occasional glass wine   Drug use: No   Sexual activity: Not Currently  Other Topics Concern   Not on file  Social History Narrative   Not on file   Social Determinants of Health   Financial Resource Strain: Not on file  Food Insecurity: Not on file  Transportation Needs: Not on file  Physical Activity: Not on file  Stress: Not on file  Social Connections: Not on file  Intimate Partner Violence: Not on file    FAMILY HISTORY: Family History  Problem Relation Age of Onset   Colon cancer Mother 23   Colon cancer Maternal Aunt        had colon sugery   Leukemia Father    Diabetes Brother    Diabetes  Brother     ALLERGIES:  is allergic to ciprofloxacin, loratadine-pseudoephedrine er, penicillins, and tolterodine tartrate.  MEDICATIONS:  Current Outpatient Medications  Medication Sig Dispense Refill   alendronate (FOSAMAX) 70 MG tablet Take 70 mg by mouth once a week. Take with a full glass of water on an empty stomach.     atorvastatin (LIPITOR) 10 MG tablet Take 10 mg by mouth daily.     ibrutinib (IMBRUVICA) 140 MG capsul Take 1 capsule (140 mg total) by mouth daily. 30 capsule 0   Vitamin D, Ergocalciferol, (DRISDOL) 50000 UNITS CAPS capsule Take 50,000 Units by mouth every 7 (seven) days.     No current facility-administered medications for this visit.    REVIEW OF SYSTEMS:   .10 Point review of Systems was done is negative except as noted above.  PHYSICAL EXAMINATION: ECOG PERFORMANCE STATUS: 2  .  There were no vitals filed for this visit.  There were no vitals filed for this visit.  .There is no height or weight on file to calculate BMI.  Marland Kitchen GENERAL:alert, in no acute distress and comfortable SKIN: no acute rashes, no significant lesions EYES: conjunctiva are pink and non-injected, sclera anicteric OROPHARYNX: MMM, no exudates, no oropharyngeal erythema or ulceration NECK: supple, no JVD LYMPH: Palpable lower cervical and axillary adenopathy LUNGS: clear to auscultation b/l with normal respiratory effort HEART: regular rate & rhythm ABDOMEN:  normoactive bowel sounds , non tender, palpable moderate splenomegaly Extremity: no pedal edema PSYCH: alert & oriented x 3 with fluent speech NEURO: no focal motor/sensory deficits *** LABORATORY DATA:  I have reviewed the data as listed  . CBC Latest Ref Rng & Units 04/08/2021 12/09/2020 07/19/2020  WBC 4.0 - 10.5 K/uL 38.2(H) 26.4(H) 19.3(H)  Hemoglobin 12.0 - 15.0 g/dL 9.7(L) 11.3(L) 10.9(L)  Hematocrit 36.0 - 46.0 % 29.3(L) 34.5(L) 33.4(L)  Platelets 150 - 400 K/uL 57(L) 74(L) 78(L)    . CMP Latest Ref Rng & Units  04/08/2021 12/09/2020 07/19/2020  Glucose 70 - 99 mg/dL 88 87 88  BUN 8 - 23 mg/dL _0 Creatinine 0.44 - 1.00 mg/dL 0.88 1.03(H) 1.00  Sodium 135 - 145 mmol/L 136 135 137  Potassium 3.5 - 5.1 mmol/L 4.5 4.6 4.9  Chloride 98 - 111 mmol/L 105 104 104  CO2 22 - 32 mmol/L _1 Calcium 8.9 - 10.3 mg/dL 8.6(L) 8.8(L) 9.2  Total Protein 6.5 - 8.1 g/dL 8.3(H) 8.7(H) 7.7  Total Bilirubin 0.3 - 1.2 mg/dL 0.5 0.5 0.4  Alkaline Phos 38 - 126 U/L 64 71 60  AST 15 - 41 U/L 43(H) 45(H) 32  ALT 0 - 44 U/L _2 Lab Results  Component Value Date   LDH 667 (H) 04/08/2021     Surgical Pathology  CASE: [id number]  PATIENT: Enzley Shieh  Flow Pathology Report      Clinical history: right orbital mass   DIAGNOSIS:   - Monoclonal B-cell population identified, see comment.   COMMENT:   There is a monoclonal population of B-cells with expression of CD5 and  CD200. The phenotype is consistent with the patient's history of small  lymphocytic lymphoma.   02/21/18 Left Lacrimal sac biopsy:    12/18/14 Cytogenetics:    12/04/14 Right cervical lymph node biopsy:   06/27/18 FISH CLL Prognostic Panel:    RADIOGRAPHIC STUDIES: I have personally reviewed the radiological images as listed and agreed with the findings in the report. NM PET Image Restag (PS) Skull Base To Thigh  Result Date: 04/23/2021 CLINICAL DATA:  Subsequent treatment strategy for chronic lymphocytic leukemia. EXAM: NUCLEAR MEDICINE PET SKULL BASE TO THIGH TECHNIQUE: 5.3 mCi F-18 FDG was injected intravenously. Full-ring PET imaging was performed from the skull base to thigh after the radiotracer. CT data was obtained and used for attenuation correction and anatomic localization. Fasting blood glucose: 86 mg/dl COMPARISON:  Multiple exams, including 07/23/2018 and CT scan 05/01/2020 FINDINGS: Mediastinal blood pool activity: SUV max 1.7 Liver activity: SUV max 2.5 NECK: There is substantial level Ia, Ib, II, III,  IV, and V adenopathy as well as bilateral parotid lymph nodes. An index right level II lymph node measures 1.1 cm in short axis on image 25 series 4 (formerly 1.3 cm) with maximum SUV 2.2 (Deauville 3), previous maximum SUV of 4.2. An index left level II lymph node measures 1.2 cm in  short axis on image 26 series 4 (formerly 1.5 cm) with maximum SUV 2.3 (Deauville 2), previously 4.6. This indicates the general trend of reduced size and reduced activity in the neck lymph nodes. Incidental CT findings: Chronic left ethmoid sinusitis. CHEST: Bilateral axillary, subpectoral, paratracheal and prevascular adenopathy noted. Index left axillary node 1.3 cm in short axis on image 54 series 4 (formerly 1.5 cm, maximum SUV 1.8 (Deauville 3), previously 4.8. Index left paratracheal node 1.1 cm in short axis on image 57 series 4 (formerly 1.4 cm), maximum SUV 1.9 (Deauville 3) formerly 3.7. In generally lymph nodes in the chest fall the pattern of mild reduction in size and reduced activity compared to prior. Incidental CT findings: A small left pleural effusion is new compared to the prior chest CT of 05/01/2020. Coronary, aortic arch, and branch vessel atherosclerotic vascular disease. Mild cardiomegaly. ABDOMEN/PELVIS: The spleen measures 15.3 by 8.6 by 19.9 cm (volume = 1400 cm^3), compatible with substantial splenomegaly; background activity in the spleen is about 2.8 compared to previous 4.1. Splenic size is increased from the prior PET-CT a more similar to the size shown on the diagnostic CT of 05/01/2020. Enlarged portacaval, retroperitoneal, and pelvic lymph nodes noted. Index portacaval node 1.7 cm in short axis on image 99 series 4 (stable) with maximum SUV of 2.4 (Deauville 3), formerly 3.6. Index left common iliac node 1.6 cm in short axis on image 21 series 4 (formerly 1.7 cm) with maximum SUV 2.7 (Deauville 4), previously 4.0. Index left pelvic sidewall lymph node 1.6 cm in short axis on image 144 series 4  (formerly 1.7 cm) with maximum SUV 2.5 (Deauville 3), formerly 4.2. Incidental CT findings: Atherosclerosis is present, including aortoiliac atherosclerotic disease. Prominent stool throughout the colon favors constipation. SKELETON: No significant abnormal hypermetabolic activity in this region. Incidental CT findings: Old healed left rib fractures. Substantial thoracolumbar spondylosis and degenerative disc disease with grade 1 anterolisthesis at L4-5 and grade 1 retrolisthesis at L2-3 and L3-4. IMPRESSION: 1. In general, the bulky adenopathy in the neck, chest, abdomen, and pelvis is mildly reduced in size and moderately reduced in activity compared to the PET-CT of 08/02/2018. Most of the current adenopathy is Deauville 3, with one index node in the left common iliac chain having low Deauville 4 activity. 2. New small left pleural effusion. 3. Other imaging findings of potential clinical significance: Aortic Atherosclerosis (ICD10-I70.0). Prominent stool throughout the colon favors constipation. Substantial thoracolumbar spondylosis. Mild cardiomegaly. Coronary atherosclerosis. Chronic left ethmoid sinusitis. Electronically Signed   By: Van Clines M.D.   On: 04/23/2021 10:54    ASSESSMENT & PLAN:   87 y.o. female with  1. Chronic Lymphocytic Leukemia 11/16/14 Right cervical lymph node biopsy revealed SLL/CLL 12/18/14 Cytogenetics revealed 59% of cells with gain of CEP12 and 5% of cells with loss of ATM 02/21/18 Right lacrimal gland revealed lymphoid infiltrates 06/27/18 FISH CLL Prognostic Panel revealed 49.33% of cells with Trisomy 12, and only 2% of cells with 17p mutation  08/03/18 PET/CT revealed "Exam positive for FDG avid adenopathy within the neck, chest, abdomen and pelvis compatible with the clinical history of chronic lymphocytic leukemia. 2. Splenomegaly (about 13cm) with mild increased radiotracer uptake throughout the spleen (above blood pool activity)".   2. History of Invasive  Ductal Carcinoma 09/16/10 Left breast 1.5cm IDC with calcifications, grade 1, perineural invasion. DCIS margins negative, 2 sentinel nodes negative, ER 99%, PR negative, Ki-67 at 79%, HER-2 negative, Oncotype 24, 16% ROR Continues on $RemoveBefo'1mg'rOrUkTHuNvA$  Anastrozole daily, began on  10/25/10. Last available Mammogram on 12/31/16 negative  3.  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  Recent right orbital biopsy also showed CLL/SLL.  Has previously had radiation treatment with temporary improvement.  4.  Thrombocytopenia related to CLL platelets lower at 57k due to CLL and splenomegaly.  PLAN:  -Discussed with patient's lab results as noted above which shows increasing anemia and thrombocytopenia with increasing lymphocyte count consistent with CLL progression. -Patient has increasing orbital swelling, significant splenomegaly, increasing anemia and thrombocytopenia all of which would be reasons to consider treatment of her CLL. -We discussed options of ibrutinib versus Gazyva plus venetoclax and patient prefers to go with only oral treatment at this time. -She is trying to balance treatment with her other medical issues including cognitive impairment. -Her brother in person and her son over the phone are helping her with decision-making. -We shall start on ibrutinib at a conservative dose of 140 mg p.o. daily progressively escalate the dose to the standard dose of 420 mg p.o. daily if tolerated. -Continue Vitamin B-Complex daily.  FOLLOW UP: ***  . The total time spent in the appointment was *** mins on chart review, discussion of lab results, discussion of treatment options, ordering ibrutinib, coordination of care with pharmacist and documentation.  All of the patient's questions were answered with apparent satisfaction. The patient knows to call the clinic with any problems, questions or concerns.    Sullivan Lone MD MS AAHIVMS [facility] Hematology/Oncology Physician [facility]   I, Melene Muller, am acting as scribe for Dr. Sullivan Lone, MD.

## 2021-05-02 ENCOUNTER — Telehealth: Payer: Self-pay | Admitting: Hematology

## 2021-05-02 NOTE — Telephone Encounter (Signed)
Sch per 2/16 inbasket, left msg for pt

## 2021-05-09 ENCOUNTER — Other Ambulatory Visit: Payer: Self-pay | Admitting: Hematology

## 2021-05-21 ENCOUNTER — Other Ambulatory Visit: Payer: Self-pay

## 2021-05-21 ENCOUNTER — Inpatient Hospital Stay (HOSPITAL_BASED_OUTPATIENT_CLINIC_OR_DEPARTMENT_OTHER): Payer: Medicare HMO | Admitting: Hematology

## 2021-05-21 ENCOUNTER — Inpatient Hospital Stay: Payer: Medicare HMO | Attending: Hematology

## 2021-05-21 DIAGNOSIS — Z87891 Personal history of nicotine dependence: Secondary | ICD-10-CM | POA: Insufficient documentation

## 2021-05-21 DIAGNOSIS — Z79811 Long term (current) use of aromatase inhibitors: Secondary | ICD-10-CM | POA: Diagnosis not present

## 2021-05-21 DIAGNOSIS — D693 Immune thrombocytopenic purpura: Secondary | ICD-10-CM

## 2021-05-21 DIAGNOSIS — C50412 Malignant neoplasm of upper-outer quadrant of left female breast: Secondary | ICD-10-CM | POA: Insufficient documentation

## 2021-05-21 DIAGNOSIS — Z79899 Other long term (current) drug therapy: Secondary | ICD-10-CM | POA: Insufficient documentation

## 2021-05-21 DIAGNOSIS — C9110 Chronic lymphocytic leukemia of B-cell type not having achieved remission: Secondary | ICD-10-CM | POA: Insufficient documentation

## 2021-05-21 DIAGNOSIS — Z170 Estrogen receptor positive status [ER+]: Secondary | ICD-10-CM | POA: Diagnosis not present

## 2021-05-21 LAB — LACTATE DEHYDROGENASE: LDH: 270 U/L — ABNORMAL HIGH (ref 98–192)

## 2021-05-21 LAB — CBC WITH DIFFERENTIAL (CANCER CENTER ONLY)
Abs Immature Granulocytes: 0.12 10*3/uL — ABNORMAL HIGH (ref 0.00–0.07)
Basophils Absolute: 0.1 10*3/uL (ref 0.0–0.1)
Basophils Relative: 0 %
Eosinophils Absolute: 0.1 10*3/uL (ref 0.0–0.5)
Eosinophils Relative: 1 %
HCT: 28.4 % — ABNORMAL LOW (ref 36.0–46.0)
Hemoglobin: 9.4 g/dL — ABNORMAL LOW (ref 12.0–15.0)
Immature Granulocytes: 1 %
Lymphocytes Relative: 85 %
Lymphs Abs: 18.5 10*3/uL — ABNORMAL HIGH (ref 0.7–4.0)
MCH: 34.6 pg — ABNORMAL HIGH (ref 26.0–34.0)
MCHC: 33.1 g/dL (ref 30.0–36.0)
MCV: 104.4 fL — ABNORMAL HIGH (ref 80.0–100.0)
Monocytes Absolute: 0.6 10*3/uL (ref 0.1–1.0)
Monocytes Relative: 3 %
Neutro Abs: 2.1 10*3/uL (ref 1.7–7.7)
Neutrophils Relative %: 10 %
Platelet Count: 35 10*3/uL — ABNORMAL LOW (ref 150–400)
RBC: 2.72 MIL/uL — ABNORMAL LOW (ref 3.87–5.11)
RDW: 15.9 % — ABNORMAL HIGH (ref 11.5–15.5)
Smear Review: NORMAL
WBC Count: 21.5 10*3/uL — ABNORMAL HIGH (ref 4.0–10.5)
nRBC: 0.1 % (ref 0.0–0.2)

## 2021-05-21 LAB — CMP (CANCER CENTER ONLY)
ALT: 14 U/L (ref 0–44)
AST: 29 U/L (ref 15–41)
Albumin: 3.2 g/dL — ABNORMAL LOW (ref 3.5–5.0)
Alkaline Phosphatase: 45 U/L (ref 38–126)
Anion gap: 6 (ref 5–15)
BUN: 18 mg/dL (ref 8–23)
CO2: 25 mmol/L (ref 22–32)
Calcium: 8.2 mg/dL — ABNORMAL LOW (ref 8.9–10.3)
Chloride: 107 mmol/L (ref 98–111)
Creatinine: 0.92 mg/dL (ref 0.44–1.00)
GFR, Estimated: >60 mL/min (ref >60)
Glucose, Bld: 114 mg/dL — ABNORMAL HIGH (ref 70–99)
Potassium: 3.5 mmol/L (ref 3.5–5.1)
Sodium: 138 mmol/L (ref 135–145)
Total Bilirubin: 0.3 mg/dL (ref 0.3–1.2)
Total Protein: 7.6 g/dL (ref 6.5–8.1)

## 2021-05-21 MED ORDER — IBRUTINIB 140 MG PO CAPS: 140.0000 mg | ORAL_CAPSULE | Freq: Every day | ORAL | 1 refills | Status: DC

## 2021-05-22 ENCOUNTER — Telehealth: Payer: Self-pay | Admitting: Hematology

## 2021-05-22 NOTE — Telephone Encounter (Signed)
Scheduled follow-up appointments per 3/8 los. Patient is aware. ?

## 2021-05-27 ENCOUNTER — Other Ambulatory Visit: Payer: Self-pay

## 2021-05-27 DIAGNOSIS — D693 Immune thrombocytopenic purpura: Secondary | ICD-10-CM

## 2021-05-28 ENCOUNTER — Inpatient Hospital Stay: Payer: Medicare HMO

## 2021-05-28 ENCOUNTER — Encounter: Payer: Self-pay | Admitting: Hematology

## 2021-05-28 NOTE — Progress Notes (Addendum)
? ? ?HEMATOLOGY/ONCOLOGY CLINIC NOTE ? ?Date of Service: .05/21/2021 ? ? ?Patient Care Team: ?[name], MD as PCP - General (Internal Medicine) ? ?CHIEF COMPLAINTS/PURPOSE OF CONSULTATION:  ?Follow-up for continued evaluation and management of CLL/SLL ? ?Oncologic History:  ?Oncology History  ?Breast cancer of upper-outer quadrant of left female breast ([place])  ?09/16/2010 Surgery  ? Left breast 1.5 cm invasive ductal carcinoma with calcifications, grade 1, perineural invasion was seen, DCIS, margins negative, 2 sentinel nodes negative, ER 99%, PR negative, Ki-67 79%, HER-2 negative, Oncotype 24, 16% ROR ?  ?10/25/2010 -  Anti-estrogen oral therapy  ? Arimidex 1 mg daily developed side effects and was held briefly and restarted ?  ?Lymphoma, small lymphocytic ([place])  ?11/16/2014 Initial Diagnosis  ? Lymphoma, small lymphocytic: Based on cervical lymph node biopsy CD5 positive CD10 negative ?  ?CLL (chronic lymphocytic leukemia) ([place])  ?12/12/2020 Cancer Staging  ? Staging form: Chronic Lymphocytic Leukemia / Small Lymphocytic Lymphoma, AJCC 8th Edition ?- Clinical stage from 12/12/2020: Modified Rai Stage IV (Modified Rai risk: High, Lymphocytosis: Present, Adenopathy: Present, Organomegaly: Present, Anemia: Present, Thrombocytopenia: Present) - Signed by [name], MD on 12/15/2020 ?Stage prefix: Post-therapy ?Response to neoadjuvant therapy: Unknown ? ?  ?12/15/2020 Initial Diagnosis  ? CLL (chronic lymphocytic leukemia) ([place]) ?  ? ? ?HISTORY OF PRESENTING ILLNESS:  ?Please see previous note for details of initial presentation ? ? ?Interval History:  ? ?Patient is here with her brother for her scheduled clinic visit for continued evaluation and management of her CLL/SLL. ?She notes some increased bruising but no other issues with bleeding while on ibrutinib. ?She is tolerating the ibrutinib without any other significant toxicities at this time. ?No nausea vomiting or diarrhea. ? ?Labs done today were  reviewed ?CBC shows hemoglobin of 9.4, WBC count 21.5k and platelets of 35k. ?LDH down to 270 from a high of 790 ?CMP stable. ?Patient had a PET CT scan on 04/22/2021 which showed 1. In general, the bulky adenopathy in the neck, chest, abdomen, and ?pelvis is mildly reduced in size and moderately reduced in activity ?compared to the PET-CT of 08/02/2018. Most of the current adenopathy ?is Deauville 3, with one index node in the left common iliac chain ?having low Deauville 4 activity.. ? ? ? ?MEDICAL HISTORY:  ?Past Medical History:  ?Diagnosis Date  ? Adenomatous colon polyp   ? tubular  ? Arthritis   ? osteo arthritis  ? Breast cancer ([place]) 02/16/2011  ? Diverticulosis   ? GERD (gastroesophageal reflux disease)   ? Hyperlipidemia   ? Leukemia ([place])   ? Lymphoma ([place])   ? small lympocytic  ? Osteopenia   ? ? ?SURGICAL HISTORY: ?Past Surgical History:  ?Procedure Laterality Date  ? bartholian cyst  1986  ? BREAST LUMPECTOMY Left 09-16-2010  ? CATARACT EXTRACTION, BILATERAL    ? [place]  ? [place] ARTHROSCOPY Right 2010  ? LYMPH GLAND EXCISION Right 11/16/2014  ? Procedure: EXCISION AND BIOPSY OF RIGHT CERVICAL LYMPH NODE;  Surgeon: Jackolyn Confer, MD;  Location: [place];  Service: General;  Laterality: Right;  ? TONSILLECTOMY  1940  ? ? ?SOCIAL HISTORY: ?Social History  ? ?Socioeconomic History  ? Marital status: Widowed  ?  Spouse name: Not on file  ? Number of children: 5  ? Years of education: Not on file  ? Highest education level: Not on file  ?Occupational History  ? Occupation: retired  ?Tobacco Use  ? Smoking status:  Former  ?  Types: Cigarettes  ?  Quit date: 1989  ?  Years since quitting: 34.2  ? Smokeless tobacco: Never  ? Tobacco comments:  ?  Quit smoking 27 years ago  ?Vaping Use  ? Vaping Use: Never used  ?Substance and Sexual Activity  ? Alcohol use: Yes  ?  Comment: occasional glass wine  ? Drug use: No  ? Sexual activity: Not Currently  ?Other Topics Concern  ?  Not on file  ?Social History Narrative  ? Not on file  ? ?Social Determinants of Health  ? ?Financial Resource Strain: Not on file  ?Food Insecurity: Not on file  ?Transportation Needs: Not on file  ?Physical Activity: Not on file  ?Stress: Not on file  ?Social Connections: Not on file  ?Intimate Partner Violence: Not on file  ? ? ?FAMILY HISTORY: ?Family History  ?Problem Relation Age of Onset  ? Colon cancer Mother 47  ? Colon cancer Maternal Aunt   ?     had colon sugery  ? Leukemia Father   ? Diabetes Brother   ? Diabetes Brother   ? ? ?ALLERGIES:  is allergic to ciprofloxacin, loratadine-pseudoephedrine er, penicillins, and tolterodine tartrate. ? ?MEDICATIONS:  ?Current Outpatient Medications  ?Medication Sig Dispense Refill  ? alendronate (FOSAMAX) 70 MG tablet Take 70 mg by mouth once a week. Take with a full glass of water on an empty stomach.    ? atorvastatin (LIPITOR) 10 MG tablet Take 10 mg by mouth daily.    ? doxycycline (VIBRA-TABS) 100 MG tablet TAKE ONE TABLET BY MOUTH TWICE DAILY FOR 5 DAYS 10 tablet 0  ? ibrutinib (IMBRUVICA) 140 MG capsule Take 1 capsule (140 mg total) by mouth daily. 30 capsule 1  ? valACYclovir (VALTREX) 500 MG tablet TAKE ONE TABLET BY MOUTH TWICE DAILY FOR 7 DAYS 14 tablet 0  ? Vitamin D, Ergocalciferol, (DRISDOL) 50000 UNITS CAPS capsule Take 50,000 Units by mouth every 7 (seven) days.    ? ?No current facility-administered medications for this visit.  ? ? ?REVIEW OF SYSTEMS:   ?10 Point review of Systems was done is negative except as noted above. ? ?PHYSICAL EXAMINATION: ?ECOG PERFORMANCE STATUS: 2 ? ?. ?Vitals:  ? 05/21/21 1459  ?BP: (!) 132/59  ?Pulse: 92  ?Resp: 15  ?Temp: 97.9 ?F (36.6 ?C)  ?SpO2: 100%  ? ?Filed Weights  ? 05/21/21 1459  ?Weight: 108 lb 6.4 oz (49.2 kg)  ? ?.Body mass index is 24.3 kg/m?.  ?. ?NAD ?GENERAL:alert, in no acute distress and comfortable ?SKIN: Bilateral upper extremity purpura. ?EYES: conjunctiva are pink and non-injected, sclera  anicteric ?OROPHARYNX: MMM, no exudates, no oropharyngeal erythema or ulceration ?NECK: supple, no JVD ?LYMPH:  no palpable lymphadenopathy in the cervical, axillary or inguinal regions ?LUNGS: clear to auscultation b/l with normal respiratory effort ?HEART: regular rate & rhythm ?ABDOMEN:  normoactive bowel sounds , non tender, not distended. ?Extremity: Bilateral 1+ pitting pedal edema ?PSYCH: alert & oriented x 3 with fluent speech ?NEURO: no focal motor/sensory deficits ? ? ?LABORATORY DATA:  ?I have reviewed the data as listed ? ?. ?CBC Latest Ref Rng & Units 05/21/2021 04/08/2021 12/09/2020  ?WBC 4.0 - 10.5 K/uL 21.5(H) 38.2(H) 26.4(H)  ?Hemoglobin 12.0 - 15.0 g/dL 9.4(L) 9.7(L) 11.3(L)  ?Hematocrit 36.0 - 46.0 % 28.4(L) 29.3(L) 34.5(L)  ?Platelets 150 - 400 K/uL 35(L) 57(L) 74(L)  ? ? ?. ?CMP Latest Ref Rng & Units 05/21/2021 04/08/2021 12/09/2020  ?Glucose 70 - 99 mg/dL 114(H)  88 87  ?BUN 8 - 23 mg/dL _0 ?Creatinine 0.44 - 1.00 mg/dL 0.92 0.88 1.03(H)  ?Sodium 135 - 145 mmol/L 138 136 135  ?Potassium 3.5 - 5.1 mmol/L 3.5 4.5 4.6  ?Chloride 98 - 111 mmol/L 107 105 104  ?CO2 22 - 32 mmol/L _1 ?Calcium 8.9 - 10.3 mg/dL 8.2(L) 8.6(L) 8.8(L)  ?Total Protein 6.5 - 8.1 g/dL 7.6 8.3(H) 8.7(H)  ?Total Bilirubin 0.3 - 1.2 mg/dL 0.3 0.5 0.5  ?Alkaline Phos 38 - 126 U/L 45 64 71  ?AST 15 - 41 U/L 29 43(H) 45(H)  ?ALT 0 - 44 U/L _2 ? ?Lab Results  ?Component Value Date  ? LDH 270 (H) 05/21/2021  ? ? ? ?Surgical Pathology  ?CASE: [phone]  ?PATIENT: Brianna York  ?Flow Pathology Report  ? ? ? ? ?Clinical history: right orbital mass  ? ?DIAGNOSIS:  ? ?- Monoclonal B-cell population identified, see comment.  ? ?COMMENT:  ? ?There is a monoclonal population of B-cells with expression of CD5 and  ?CD200. The phenotype is consistent with the patient's history of small  ?lymphocytic lymphoma.  ? ?02/21/18 Left Lacrimal sac biopsy: ? ? ? ?12/18/14 Cytogenetics: ? ? ? ?12/04/14 Right cervical lymph node  biopsy: ? ? ?06/27/18 FISH CLL Prognostic Panel: ? ? ? ?RADIOGRAPHIC STUDIES: ?I have personally reviewed the radiological images as listed and agreed with the findings in the report. ?No results found. ? ?ASSESSMENT & PLAN:  ? ?

## 2021-06-04 ENCOUNTER — Inpatient Hospital Stay: Payer: Medicare HMO

## 2021-06-11 ENCOUNTER — Inpatient Hospital Stay: Payer: Medicare HMO

## 2021-06-11 ENCOUNTER — Other Ambulatory Visit: Payer: Self-pay

## 2021-06-11 VITALS — BP 148/62 | HR 68 | Temp 98.4°F | Resp 20

## 2021-06-11 DIAGNOSIS — D693 Immune thrombocytopenic purpura: Secondary | ICD-10-CM

## 2021-06-11 DIAGNOSIS — C9110 Chronic lymphocytic leukemia of B-cell type not having achieved remission: Secondary | ICD-10-CM

## 2021-06-11 LAB — CBC WITH DIFFERENTIAL (CANCER CENTER ONLY)
Abs Immature Granulocytes: 0.18 10*3/uL — ABNORMAL HIGH (ref 0.00–0.07)
Basophils Absolute: 0 10*3/uL (ref 0.0–0.1)
Basophils Relative: 0 %
Eosinophils Absolute: 0.2 10*3/uL (ref 0.0–0.5)
Eosinophils Relative: 0 %
HCT: 32.9 % — ABNORMAL LOW (ref 36.0–46.0)
Hemoglobin: 10.4 g/dL — ABNORMAL LOW (ref 12.0–15.0)
Immature Granulocytes: 0 %
Lymphocytes Relative: 93 %
Lymphs Abs: 53.9 10*3/uL — ABNORMAL HIGH (ref 0.7–4.0)
MCH: 32.7 pg (ref 26.0–34.0)
MCHC: 31.6 g/dL (ref 30.0–36.0)
MCV: 103.5 fL — ABNORMAL HIGH (ref 80.0–100.0)
Monocytes Absolute: 0.9 10*3/uL (ref 0.1–1.0)
Monocytes Relative: 2 %
Neutro Abs: 3 10*3/uL (ref 1.7–7.7)
Neutrophils Relative %: 5 %
Platelet Count: 62 10*3/uL — ABNORMAL LOW (ref 150–400)
RBC: 3.18 MIL/uL — ABNORMAL LOW (ref 3.87–5.11)
RDW: 14.8 % (ref 11.5–15.5)
WBC Count: 58.1 10*3/uL (ref 4.0–10.5)
nRBC: 0 % (ref 0.0–0.2)

## 2021-06-11 LAB — CMP (CANCER CENTER ONLY)
ALT: 14 U/L (ref 0–44)
AST: 25 U/L (ref 15–41)
Albumin: 3.2 g/dL — ABNORMAL LOW (ref 3.5–5.0)
Alkaline Phosphatase: 48 U/L (ref 38–126)
Anion gap: 5 (ref 5–15)
BUN: 23 mg/dL (ref 8–23)
CO2: 25 mmol/L (ref 22–32)
Calcium: 9 mg/dL (ref 8.9–10.3)
Chloride: 106 mmol/L (ref 98–111)
Creatinine: 0.81 mg/dL (ref 0.44–1.00)
GFR, Estimated: >60 mL/min (ref >60)
Glucose, Bld: 73 mg/dL (ref 70–99)
Potassium: 4 mmol/L (ref 3.5–5.1)
Sodium: 136 mmol/L (ref 135–145)
Total Bilirubin: 0.4 mg/dL (ref 0.3–1.2)
Total Protein: 7.9 g/dL (ref 6.5–8.1)

## 2021-06-11 LAB — LACTATE DEHYDROGENASE: LDH: 188 U/L (ref 98–192)

## 2021-06-11 MED ORDER — ROMIPLOSTIM 125 MCG ~~LOC~~ SOLR
50.0000 ug | Freq: Once | SUBCUTANEOUS | Status: AC
  Administered 2021-06-11: 50 ug via SUBCUTANEOUS
  Filled 2021-06-11: qty 0.1

## 2021-06-11 NOTE — Patient Instructions (Signed)
Romiplostim injection ?What is this medication? ?ROMIPLOSTIM (roe mi PLOE stim) helps your body make more platelets. This medicine is used to treat low platelets caused by chronic idiopathic thrombocytopenic purpura (ITP) or a bone marrow syndrome caused by radiation sickness. ?This medicine may be used for other purposes; ask your health care provider or pharmacist if you have questions. ?COMMON BRAND NAME(S): Nplate ?What should I tell my care team before I take this medication? ?They need to know if you have any of these conditions: ?blood clots ?myelodysplastic syndrome ?an unusual or allergic reaction to romiplostim, mannitol, other medicines, foods, dyes, or preservatives ?pregnant or trying to get pregnant ?breast-feeding ?How should I use this medication? ?This medicine is injected under the skin. It is given by a health care provider in a hospital or clinic setting. ?A special MedGuide will be given to you before each treatment. Be sure to read this information carefully each time. ?Talk to your health care provider about the use of this medicine in children. While it may be prescribed for children as young as newborns for selected conditions, precautions do apply. ?Overdosage: If you think you have taken too much of this medicine contact a poison control center or emergency room at once. ?NOTE: This medicine is only for you. Do not share this medicine with others. ?What if I miss a dose? ?Keep appointments for follow-up doses. It is important not to miss your dose. Call your health care provider if you are unable to keep an appointment. ?What may interact with this medication? ?Interactions are not expected. ?This list may not describe all possible interactions. Give your health care provider a list of all the medicines, herbs, non-prescription drugs, or dietary supplements you use. Also tell them if you smoke, drink alcohol, or use illegal drugs. Some items may interact with your medicine. ?What should I  watch for while using this medication? ?Visit your health care provider for regular checks on your progress. You may need blood work done while you are taking this medicine. Your condition will be monitored carefully while you are receiving this medicine. It is important not to miss any appointments. ?What side effects may I notice from receiving this medication? ?Side effects that you should report to your doctor or health care professional as soon as possible: ?allergic reactions (skin rash, itching or hives; swelling of the face, lips, or tongue) ?bleeding (bloody or black, tarry stools; red or dark brown urine; spitting up blood or brown material that looks like coffee grounds; red spots on the skin; unusual bruising or bleeding from the eyes, gums, or nose) ?blood clot (chest pain; shortness of breath; pain, swelling, or warmth in the leg) ?stroke (changes in vision; confusion; trouble speaking or understanding; severe headaches; sudden numbness or weakness of the face, arm or leg; trouble walking; dizziness; loss of balance or coordination) ?Side effects that usually do not require medical attention (report to your doctor or health care professional if they continue or are bothersome): ?diarrhea ?dizziness ?headache ?joint pain ?muscle pain ?stomach pain ?trouble sleeping ?This list may not describe all possible side effects. Call your doctor for medical advice about side effects. You may report side effects to FDA at 1-800-FDA-1088. ?Where should I keep my medication? ?This medicine is given in a hospital or clinic. It will not be stored at home. ?NOTE: This sheet is a summary. It may not cover all possible information. If you have questions about this medicine, talk to your doctor, pharmacist, or health care provider. ??   2022 Elsevier/Gold Standard (2020-11-19 00:00:00) ? ?

## 2021-06-18 ENCOUNTER — Inpatient Hospital Stay (HOSPITAL_BASED_OUTPATIENT_CLINIC_OR_DEPARTMENT_OTHER): Payer: Medicare HMO

## 2021-06-18 ENCOUNTER — Inpatient Hospital Stay (HOSPITAL_BASED_OUTPATIENT_CLINIC_OR_DEPARTMENT_OTHER): Payer: Medicare HMO | Admitting: Hematology

## 2021-06-18 ENCOUNTER — Inpatient Hospital Stay: Payer: Medicare HMO | Attending: Hematology

## 2021-06-18 ENCOUNTER — Other Ambulatory Visit: Payer: Self-pay

## 2021-06-18 ENCOUNTER — Telehealth: Payer: Self-pay

## 2021-06-18 ENCOUNTER — Other Ambulatory Visit (HOSPITAL_COMMUNITY): Payer: Self-pay

## 2021-06-18 DIAGNOSIS — Z79899 Other long term (current) drug therapy: Secondary | ICD-10-CM | POA: Diagnosis not present

## 2021-06-18 DIAGNOSIS — C9110 Chronic lymphocytic leukemia of B-cell type not having achieved remission: Secondary | ICD-10-CM

## 2021-06-18 DIAGNOSIS — D693 Immune thrombocytopenic purpura: Secondary | ICD-10-CM | POA: Insufficient documentation

## 2021-06-18 DIAGNOSIS — Z806 Family history of leukemia: Secondary | ICD-10-CM | POA: Insufficient documentation

## 2021-06-18 DIAGNOSIS — Z87891 Personal history of nicotine dependence: Secondary | ICD-10-CM | POA: Diagnosis not present

## 2021-06-18 DIAGNOSIS — Z800 Family history of malignant neoplasm of digestive organs: Secondary | ICD-10-CM | POA: Diagnosis not present

## 2021-06-18 LAB — LACTATE DEHYDROGENASE: LDH: 201 U/L — ABNORMAL HIGH (ref 98–192)

## 2021-06-18 LAB — CMP (CANCER CENTER ONLY)
ALT: 15 U/L (ref 0–44)
AST: 25 U/L (ref 15–41)
Albumin: 3.4 g/dL — ABNORMAL LOW (ref 3.5–5.0)
Alkaline Phosphatase: 43 U/L (ref 38–126)
Anion gap: 6 (ref 5–15)
BUN: 22 mg/dL (ref 8–23)
CO2: 25 mmol/L (ref 22–32)
Calcium: 8.6 mg/dL — ABNORMAL LOW (ref 8.9–10.3)
Chloride: 105 mmol/L (ref 98–111)
Creatinine: 0.61 mg/dL (ref 0.44–1.00)
GFR, Estimated: >60 mL/min (ref >60)
Glucose, Bld: 156 mg/dL — ABNORMAL HIGH (ref 70–99)
Potassium: 4.1 mmol/L (ref 3.5–5.1)
Sodium: 136 mmol/L (ref 135–145)
Total Bilirubin: 0.3 mg/dL (ref 0.3–1.2)
Total Protein: 8.3 g/dL — ABNORMAL HIGH (ref 6.5–8.1)

## 2021-06-18 LAB — CBC WITH DIFFERENTIAL (CANCER CENTER ONLY)
Abs Immature Granulocytes: 0.16 10*3/uL — ABNORMAL HIGH (ref 0.00–0.07)
Basophils Absolute: 0.1 10*3/uL (ref 0.0–0.1)
Basophils Relative: 0 %
Eosinophils Absolute: 0.1 10*3/uL (ref 0.0–0.5)
Eosinophils Relative: 0 %
HCT: 32.3 % — ABNORMAL LOW (ref 36.0–46.0)
Hemoglobin: 10.1 g/dL — ABNORMAL LOW (ref 12.0–15.0)
Immature Granulocytes: 0 %
Lymphocytes Relative: 90 %
Lymphs Abs: 37 10*3/uL — ABNORMAL HIGH (ref 0.7–4.0)
MCH: 32.5 pg (ref 26.0–34.0)
MCHC: 31.3 g/dL (ref 30.0–36.0)
MCV: 103.9 fL — ABNORMAL HIGH (ref 80.0–100.0)
Monocytes Absolute: 0.6 10*3/uL (ref 0.1–1.0)
Monocytes Relative: 2 %
Neutro Abs: 3.1 10*3/uL (ref 1.7–7.7)
Neutrophils Relative %: 8 %
Platelet Count: 51 10*3/uL — ABNORMAL LOW (ref 150–400)
RBC: 3.11 MIL/uL — ABNORMAL LOW (ref 3.87–5.11)
RDW: 14.6 % (ref 11.5–15.5)
WBC Count: 41 10*3/uL — ABNORMAL HIGH (ref 4.0–10.5)
nRBC: 0 % (ref 0.0–0.2)

## 2021-06-18 MED ORDER — ROMIPLOSTIM 125 MCG ~~LOC~~ SOLR
2.0000 ug/kg | SUBCUTANEOUS | Status: DC
  Administered 2021-06-18: 100 ug via SUBCUTANEOUS
  Filled 2021-06-18: qty 0.2

## 2021-06-18 MED ORDER — ACALABRUTINIB 100 MG PO CAPS
100.0000 mg | ORAL_CAPSULE | Freq: Every day | ORAL | 1 refills | Status: DC
  Filled 2021-06-18: qty 60, 60d supply, fill #0

## 2021-06-18 MED ORDER — B-12 1000 MCG SL SUBL
1000.0000 ug | SUBLINGUAL_TABLET | Freq: Every day | SUBLINGUAL | 11 refills | Status: AC
Start: 2021-06-18 — End: ?

## 2021-06-18 NOTE — Telephone Encounter (Signed)
Oral Oncology Patient Advocate Encounter ?  ?Received notification from [employer] that prior authorization for Calquence is required. ?  ?PA submitted on CoverMyMeds ?Key [id number]  ?Status is pending ?  ?Oral Oncology Clinic will continue to follow. ? ?Wynn Maudlin CPHT ?Specialty Pharmacy Patient Advocate ?[place] ?Phone [phone] ?Fax [phone] ?06/18/2021 3:46 PM ? ? ?

## 2021-06-18 NOTE — Telephone Encounter (Signed)
Oral Oncology Patient Advocate Encounter ? ?Prior Authorization for Calquence tablets has been approved.   ? ?PA# [id number] ?Effective dates: 05/19/21 through 06/18/22 ? ?Patient must use contracted specialty pharmacy ([facility] or [facility]) ? ?Oral Oncology Clinic will continue to follow.  ? ?Wynn Maudlin CPHT ?Specialty Pharmacy Patient Advocate ?[place] ?Phone [phone] ?Fax [phone] ?06/18/2021 4:15 PM ? ?

## 2021-06-19 ENCOUNTER — Telehealth: Payer: Self-pay | Admitting: Pharmacist

## 2021-06-19 DIAGNOSIS — C9110 Chronic lymphocytic leukemia of B-cell type not having achieved remission: Secondary | ICD-10-CM

## 2021-06-19 MED ORDER — CALQUENCE 100 MG PO TABS: 100.0000 mg | ORAL_TABLET | Freq: Every day | ORAL | 1 refills | Status: DC

## 2021-06-19 NOTE — Telephone Encounter (Signed)
Oral Oncology Pharmacist Encounter ? ?Received new prescription for Calquence (acalabrutinib) for the treatment of CLL, planned duration until disease progression or unacceptable drug toxicity. ? ?CBC w/ Diff and CMP from 06/18/21 assessed, noted WBC 41 K/uL, pltc 51 K/uL (received 100 mcg Nplate 06/19/21). Prescription dose and frequency assessed for appropriateness. Per MD due to thrombocytopenia and concern for tolerance, Calquence will be started at once daily dosing and then dose escalated as tolerated per Dr. Irene Limbo.  ? ?Current medication list in Epic reviewed, no relevant/significant DDIs with Calquence identified. ? ?Evaluated chart and no patient barriers to medication adherence noted.  ? ?Prescription sent to [facility] for dispensing due to insurance restrictions. Representative at [facility] notified.  ? ?Oral Oncology Clinic will continue to follow for insurance authorization, copayment issues, initial counseling and start date. ? ?Leron Croak, PharmD, BCPS ?Hematology/Oncology Clinical Pharmacist ?[name] Oral Chemotherapy Navigation Clinics ?[phone] ?06/19/2021 1:40 PM ? ?

## 2021-06-23 ENCOUNTER — Telehealth: Payer: Self-pay | Admitting: Hematology

## 2021-06-23 NOTE — Telephone Encounter (Signed)
Oral Chemotherapy Pharmacist Encounter  ? ?Spoke with patient's daughter today to follow up regarding patient's new oral chemotherapy medication: Calquence (acalabrutinib) ? ?Notified by [place] that patient will need to call [facility] at [phone] to enroll before her insurance will cover the [place]. This information was given to patient's daughter, Reggy Eye, who stated she would take care of this.  ? ?Patient's daughter stated she and Ms. Klugh's brother, Shanon Brow, would be the best to discuss the details of the Calquence once it is set for delivery. I informed her I would call her closer to time of the start date for Calquence. She expressed appreciation and understanding, no further questions at this time.  ? ?Leron Croak, PharmD, BCPS ?Hematology/Oncology Clinical Pharmacist ?[name] Oral Chemotherapy Navigation Clinics ?[phone] ?06/23/2021 11:23 AM ? ?

## 2021-06-23 NOTE — Telephone Encounter (Signed)
Left message with follow-up appointments per 4/5 los. ?

## 2021-06-25 ENCOUNTER — Encounter: Payer: Self-pay | Admitting: Hematology

## 2021-06-25 ENCOUNTER — Other Ambulatory Visit: Payer: Self-pay

## 2021-06-25 ENCOUNTER — Inpatient Hospital Stay: Payer: Medicare HMO

## 2021-06-25 VITALS — BP 124/62 | HR 79 | Temp 99.0°F | Resp 18

## 2021-06-25 DIAGNOSIS — D693 Immune thrombocytopenic purpura: Secondary | ICD-10-CM

## 2021-06-25 DIAGNOSIS — C9110 Chronic lymphocytic leukemia of B-cell type not having achieved remission: Secondary | ICD-10-CM

## 2021-06-25 LAB — CBC WITH DIFFERENTIAL (CANCER CENTER ONLY)
Abs Immature Granulocytes: 0.08 10*3/uL — ABNORMAL HIGH (ref 0.00–0.07)
Basophils Absolute: 0.1 10*3/uL (ref 0.0–0.1)
Basophils Relative: 0 %
Eosinophils Absolute: 0.1 10*3/uL (ref 0.0–0.5)
Eosinophils Relative: 0 %
HCT: 30.8 % — ABNORMAL LOW (ref 36.0–46.0)
Hemoglobin: 9.8 g/dL — ABNORMAL LOW (ref 12.0–15.0)
Immature Granulocytes: 0 %
Lymphocytes Relative: 88 %
Lymphs Abs: 24.9 10*3/uL — ABNORMAL HIGH (ref 0.7–4.0)
MCH: 32.7 pg (ref 26.0–34.0)
MCHC: 31.8 g/dL (ref 30.0–36.0)
MCV: 102.7 fL — ABNORMAL HIGH (ref 80.0–100.0)
Monocytes Absolute: 0.8 10*3/uL (ref 0.1–1.0)
Monocytes Relative: 3 %
Neutro Abs: 2.5 10*3/uL (ref 1.7–7.7)
Neutrophils Relative %: 9 %
Platelet Count: 79 10*3/uL — ABNORMAL LOW (ref 150–400)
RBC: 3 MIL/uL — ABNORMAL LOW (ref 3.87–5.11)
RDW: 14.6 % (ref 11.5–15.5)
Smear Review: NORMAL
WBC Count: 28.4 10*3/uL — ABNORMAL HIGH (ref 4.0–10.5)
nRBC: 0 % (ref 0.0–0.2)

## 2021-06-25 LAB — CMP (CANCER CENTER ONLY)
ALT: 17 U/L (ref 0–44)
AST: 25 U/L (ref 15–41)
Albumin: 3.1 g/dL — ABNORMAL LOW (ref 3.5–5.0)
Alkaline Phosphatase: 40 U/L (ref 38–126)
Anion gap: 6 (ref 5–15)
BUN: 23 mg/dL (ref 8–23)
CO2: 24 mmol/L (ref 22–32)
Calcium: 8.7 mg/dL — ABNORMAL LOW (ref 8.9–10.3)
Chloride: 108 mmol/L (ref 98–111)
Creatinine: 1.17 mg/dL — ABNORMAL HIGH (ref 0.44–1.00)
GFR, Estimated: 46 mL/min — ABNORMAL LOW (ref >60)
Glucose, Bld: 105 mg/dL — ABNORMAL HIGH (ref 70–99)
Potassium: 4.3 mmol/L (ref 3.5–5.1)
Sodium: 138 mmol/L (ref 135–145)
Total Bilirubin: 0.3 mg/dL (ref 0.3–1.2)
Total Protein: 7.6 g/dL (ref 6.5–8.1)

## 2021-06-25 LAB — LACTATE DEHYDROGENASE: LDH: 177 U/L (ref 98–192)

## 2021-06-25 MED ORDER — ROMIPLOSTIM 125 MCG ~~LOC~~ SOLR
2.0000 ug/kg | Freq: Once | SUBCUTANEOUS | Status: AC
  Administered 2021-06-25: 100 ug via SUBCUTANEOUS
  Filled 2021-06-25: qty 0.2

## 2021-06-25 NOTE — Patient Instructions (Signed)
Romiplostim injection ?What is this medication? ?ROMIPLOSTIM (roe mi PLOE stim) helps your body make more platelets. This medicine is used to treat low platelets caused by chronic idiopathic thrombocytopenic purpura (ITP) or a bone marrow syndrome caused by radiation sickness. ?This medicine may be used for other purposes; ask your health care provider or pharmacist if you have questions. ?COMMON BRAND NAME(S): Nplate ?What should I tell my care team before I take this medication? ?They need to know if you have any of these conditions: ?blood clots ?myelodysplastic syndrome ?an unusual or allergic reaction to romiplostim, mannitol, other medicines, foods, dyes, or preservatives ?pregnant or trying to get pregnant ?breast-feeding ?How should I use this medication? ?This medicine is injected under the skin. It is given by a health care provider in a hospital or clinic setting. ?A special MedGuide will be given to you before each treatment. Be sure to read this information carefully each time. ?Talk to your health care provider about the use of this medicine in children. While it may be prescribed for children as young as newborns for selected conditions, precautions do apply. ?Overdosage: If you think you have taken too much of this medicine contact a poison control center or emergency room at once. ?NOTE: This medicine is only for you. Do not share this medicine with others. ?What if I miss a dose? ?Keep appointments for follow-up doses. It is important not to miss your dose. Call your health care provider if you are unable to keep an appointment. ?What may interact with this medication? ?Interactions are not expected. ?This list may not describe all possible interactions. Give your health care provider a list of all the medicines, herbs, non-prescription drugs, or dietary supplements you use. Also tell them if you smoke, drink alcohol, or use illegal drugs. Some items may interact with your medicine. ?What should I  watch for while using this medication? ?Visit your health care provider for regular checks on your progress. You may need blood work done while you are taking this medicine. Your condition will be monitored carefully while you are receiving this medicine. It is important not to miss any appointments. ?What side effects may I notice from receiving this medication? ?Side effects that you should report to your doctor or health care professional as soon as possible: ?allergic reactions (skin rash, itching or hives; swelling of the face, lips, or tongue) ?bleeding (bloody or black, tarry stools; red or dark brown urine; spitting up blood or brown material that looks like coffee grounds; red spots on the skin; unusual bruising or bleeding from the eyes, gums, or nose) ?blood clot (chest pain; shortness of breath; pain, swelling, or warmth in the leg) ?stroke (changes in vision; confusion; trouble speaking or understanding; severe headaches; sudden numbness or weakness of the face, arm or leg; trouble walking; dizziness; loss of balance or coordination) ?Side effects that usually do not require medical attention (report to your doctor or health care professional if they continue or are bothersome): ?diarrhea ?dizziness ?headache ?joint pain ?muscle pain ?stomach pain ?trouble sleeping ?This list may not describe all possible side effects. Call your doctor for medical advice about side effects. You may report side effects to FDA at 1-800-FDA-1088. ?Where should I keep my medication? ?This medicine is given in a hospital or clinic. It will not be stored at home. ?NOTE: This sheet is a summary. It may not cover all possible information. If you have questions about this medicine, talk to your doctor, pharmacist, or health care provider. ??   2022 Elsevier/Gold Standard (2020-11-19 00:00:00) ? ?

## 2021-06-25 NOTE — Progress Notes (Signed)
? ? ?HEMATOLOGY/ONCOLOGY CLINIC NOTE ? ?Date of Service: .06/18/2021 ? ? ?Patient Care Team: ?Brianna Infante, MD as PCP - General (Internal Medicine) ? ?CHIEF COMPLAINTS/PURPOSE OF CONSULTATION:  ?Follow-up for continued evaluation and management of CLL ? ?Oncologic History:  ?Oncology History  ?Breast cancer of upper-outer quadrant of left female breast ([place])  ?09/16/2010 Surgery  ? Left breast 1.5 cm invasive ductal carcinoma with calcifications, grade 1, perineural invasion was seen, DCIS, margins negative, 2 sentinel nodes negative, ER 99%, PR negative, Ki-67 79%, HER-2 negative, Oncotype 24, 16% ROR ?  ?10/25/2010 -  Anti-estrogen oral therapy  ? Arimidex 1 mg daily developed side effects and was held briefly and restarted ?  ?Lymphoma, small lymphocytic ([place])  ?11/16/2014 Initial Diagnosis  ? Lymphoma, small lymphocytic: Based on cervical lymph node biopsy CD5 positive CD10 negative ?  ?CLL (chronic lymphocytic leukemia) ([place])  ?12/12/2020 Cancer Staging  ? Staging form: Chronic Lymphocytic Leukemia / Small Lymphocytic Lymphoma, AJCC 8th Edition ?- Clinical stage from 12/12/2020: Modified Rai Stage IV (Modified Rai risk: High, Lymphocytosis: Present, Adenopathy: Present, Organomegaly: Present, Anemia: Present, Thrombocytopenia: Present) - Signed by Brianna Genera, MD on 12/15/2020 ?Stage prefix: Post-therapy ?Response to neoadjuvant therapy: Unknown ? ?  ?12/15/2020 Initial Diagnosis  ? CLL (chronic lymphocytic leukemia) ([place]) ?  ? ? ?HISTORY OF PRESENTING ILLNESS:  ?Please see previous note for details of initial presentation ? ? ?Interval History:  ? ?Patient is here for continued evaluation and management of her CLL.  She is accompanied by her brother for this clinic visit.  She notes that she is tolerating the ibrutinib 140 mg p.o. daily reasonably overall but does note some increased bruising over her upper and lower extremities.  Some decreasing swelling around her eyes and in the axilla as well as neck.   Blood pressure stable.  We discussed and patient is agreeable to switching to do suggested a acalabrutib. ?Patient is tolerating Nplate.  Platelets are stable.  Nplate dose increased to 2 mcg/kg.Brianna York ?No other acute new symptoms.  No new medications. ? ? ?MEDICAL HISTORY:  ?Past Medical History:  ?Diagnosis Date  ? Adenomatous colon polyp   ? tubular  ? Arthritis   ? osteo arthritis  ? Breast cancer ([place]) 02/16/2011  ? Diverticulosis   ? GERD (gastroesophageal reflux disease)   ? Hyperlipidemia   ? Leukemia ([place])   ? Lymphoma ([place])   ? small lympocytic  ? Osteopenia   ? ? ?SURGICAL HISTORY: ?Past Surgical History:  ?Procedure Laterality Date  ? bartholian cyst  1986  ? BREAST LUMPECTOMY Left 09-16-2010  ? CATARACT EXTRACTION, BILATERAL    ? [place]  ? KNEE ARTHROSCOPY Right 2010  ? LYMPH GLAND EXCISION Right 11/16/2014  ? Procedure: EXCISION AND BIOPSY OF RIGHT CERVICAL LYMPH NODE;  Surgeon: Jackolyn Confer, MD;  Location: [facility];  Service: General;  Laterality: Right;  ? TONSILLECTOMY  1940  ? ? ?SOCIAL HISTORY: ?Social History  ? ?Socioeconomic History  ? Marital status: Widowed  ?  Spouse name: Not on file  ? Number of children: 5  ? Years of education: Not on file  ? Highest education level: Not on file  ?Occupational History  ? Occupation: retired  ?Tobacco Use  ? Smoking status: Former  ?  Types: Cigarettes  ?  Quit date: 1989  ?  Years since quitting: 34.2  ? Smokeless tobacco: Never  ? Tobacco comments:  ?  Quit smoking 27 years ago  ?  Vaping Use  ? Vaping Use: Never used  ?Substance and Sexual Activity  ? Alcohol use: Yes  ?  Comment: occasional glass wine  ? Drug use: No  ? Sexual activity: Not Currently  ?Other Topics Concern  ? Not on file  ?Social History Narrative  ? Not on file  ? ?Social Determinants of Health  ? ?Financial Resource Strain: Not on file  ?Food Insecurity: Not on file  ?Transportation Needs: Not on file  ?Physical Activity: Not on file  ?Stress: Not  on file  ?Social Connections: Not on file  ?Intimate Partner Violence: Not on file  ? ? ?FAMILY HISTORY: ?Family History  ?Problem Relation Age of Onset  ? Colon cancer Mother 19  ? Colon cancer Maternal Aunt   ?     had colon sugery  ? Leukemia Father   ? Diabetes Brother   ? Diabetes Brother   ? ? ?ALLERGIES:  is allergic to ciprofloxacin, loratadine-pseudoephedrine er, penicillins, and tolterodine tartrate. ? ?MEDICATIONS:  ?Current Outpatient Medications  ?Medication Sig Dispense Refill  ? alendronate (FOSAMAX) 70 MG tablet Take 70 mg by mouth once a week. Take with a full glass of water on an empty stomach.    ? atorvastatin (LIPITOR) 10 MG tablet Take 10 mg by mouth daily.    ? Cyanocobalamin (B-12) 1000 MCG SUBL Place 1,000 mcg under the tongue daily. 30 tablet 11  ? valACYclovir (VALTREX) 500 MG tablet TAKE ONE TABLET BY MOUTH TWICE DAILY FOR 7 DAYS 14 tablet 0  ? Vitamin D, Ergocalciferol, (DRISDOL) 50000 UNITS CAPS capsule Take 50,000 Units by mouth every 7 (seven) days.    ? Acalabrutinib Maleate (CALQUENCE) 100 MG TABS Take 100 mg by mouth daily. 30 tablet 1  ? doxycycline (VIBRA-TABS) 100 MG tablet TAKE ONE TABLET BY MOUTH TWICE DAILY FOR 5 DAYS (Patient not taking: Reported on 06/18/2021) 10 tablet 0  ? ?No current facility-administered medications for this visit.  ? ? ?REVIEW OF SYSTEMS:   ?10 Point review of Systems was done is negative except as noted above. ? ?PHYSICAL EXAMINATION: ?ECOG PERFORMANCE STATUS: 2 ? ?. ?Vitals:  ? 06/18/21 1428  ?BP: (!) 122/50  ?Pulse: 84  ?Resp: 18  ?Temp: (!) 97.5 ?F (36.4 ?C)  ?SpO2: 100%  ? ?Filed Weights  ? 06/18/21 1428  ?Weight: 110 lb (49.9 kg)  ? ?.Body mass index is 24.66 kg/m?Brianna York  ?NAD ?GENERAL:alert, in no acute distress and comfortable ?SKIN: no acute rashes, no significant lesions ?EYES: conjunctiva are pink and non-injected, sclera anicteric ?OROPHARYNX: MMM, no exudates, no oropharyngeal erythema or ulceration ?NECK: supple, no JVD ?LYMPH:  no palpable  lymphadenopathy in the cervical, axillary or inguinal regions ?LUNGS: clear to auscultation b/l with normal respiratory effort ?HEART: regular rate & rhythm ?ABDOMEN:  normoactive bowel sounds , non tender, not distended. ?Extremity: no pedal edema ?PSYCH: alert & oriented x 3 with fluent speech ?NEURO: no focal motor/sensory deficits ? ? ?LABORATORY DATA:  ?I have reviewed the data as listed ? ?. ? ?  Latest Ref Rng & Units 06/18/2021  ?  1:54 PM 06/11/2021  ?  2:06 PM 05/21/2021  ?  2:46 PM  ?CBC  ?WBC 4.0 - 10.5 K/uL 41.0   58.1   21.5    ?Hemoglobin 12.0 - 15.0 g/dL 10.1   10.4   9.4    ?Hematocrit 36.0 - 46.0 % 32.3   32.9   28.4    ?Platelets 150 - 400 K/uL 51   62  35    ? ? ?. ? ?  Latest Ref Rng & Units 06/18/2021  ?  1:54 PM 06/11/2021  ?  2:06 PM 05/21/2021  ?  2:46 PM  ?CMP  ?Glucose 70 - 99 mg/dL 156   73   114    ?BUN 8 - 23 mg/dL _0 ?Creatinine 0.44 - 1.00 mg/dL 0.61   0.81   0.92    ?Sodium 135 - 145 mmol/L 136   136   138    ?Potassium 3.5 - 5.1 mmol/L 4.1   4.0   3.5    ?Chloride 98 - 111 mmol/L 105   106   107    ?CO2 22 - 32 mmol/L _1 ?Calcium 8.9 - 10.3 mg/dL 8.6   9.0   8.2    ?Total Protein 6.5 - 8.1 g/dL 8.3   7.9   7.6    ?Total Bilirubin 0.3 - 1.2 mg/dL 0.3   0.4   0.3    ?Alkaline Phos 38 - 126 U/L 43   48   45    ?AST 15 - 41 U/L _2 ?ALT 0 - 44 U/L _3 ? ?Lab Results  ?Component Value Date  ? LDH 201 (H) 06/18/2021  ? ? ? ?Surgical Pathology  ?CASE: [id number]  ?PATIENT: Brianna York  ?Flow Pathology Report  ? ? ? ? ?Clinical history: right orbital mass  ? ?DIAGNOSIS:  ? ?- Monoclonal B-cell population identified, see comment.  ? ?COMMENT:  ? ?There is a monoclonal population of B-cells with expression of CD5 and  ?CD200. The phenotype is consistent with the patient's history of small  ?lymphocytic lymphoma.  ? ?02/21/18 Left Lacrimal sac biopsy: ? ? ? ?12/18/14 Cytogenetics: ? ? ? ?12/04/14 Right cervical lymph node biopsy: ? ? ?06/27/18 FISH  CLL Prognostic Panel: ? ? ? ?RADIOGRAPHIC STUDIES: ?I have personally reviewed the radiological images as listed and agreed with the findings in the report. ?No results found. ? ?ASSESSMENT & PLAN:  ? ?45 y

## 2021-06-26 NOTE — Telephone Encounter (Signed)
Oral Chemotherapy Pharmacist Encounter ? ?I spoke with patient's brother, Brianna York, who also serves as her caretaker for overview of: Calquence (acalabrutinib) for the treatment of CLL, planned duration until disease progression or unacceptable toxicity.  ? ?Counseled on administration, dosing, side effects, monitoring, drug-food interactions, safe handling, storage, and disposal. ? ?Patient will take Calquence '100mg'$  tablet, 1 tablet by mouth daily, with or with out food, with a glass of water. ? ?Calquence start date: 06/27/21 (Patient took last dose of ibrutinib on 06/25/21.) ? ?Adverse effects include but are not limited to: headache, diarrhea, fatigue, muscle pain, bruising, decreased blood counts, and altered cardiac conduction. ? ?Discussed headaches from [place] respond well to caffeine and then PRN APAP if needed. Recommended to avoid ibuprofen as this can increase risk of bleeding.    ? ?Reviewed importance of keeping a medication schedule and plan for any missed doses. No barriers to medication adherence identified. ? ?Medication reconciliation performed and medication/allergy list updated. ? ?Insurance authorization for [employer] has been obtained. Medication will deliver from [facility] to patient's home on 06/26/21. ? ?All questions answered. ? ?Patient's brother voiced understanding and appreciation. He states that he will also relay this information to patient's daughter, Brianna York.   ? ?Medication education handout placed in mail for patient and patient's family. Patient's family knows to call the office with questions or concerns. Oral Chemotherapy Clinic phone number provided.  ? ?Leron Croak, PharmD, BCPS ?Hematology/Oncology Clinical Pharmacist ?[name] Oral Chemotherapy Navigation Clinics ?[phone] ?06/26/2021 10:56 AM ? ?

## 2021-07-02 ENCOUNTER — Inpatient Hospital Stay: Payer: Medicare HMO

## 2021-07-02 ENCOUNTER — Other Ambulatory Visit: Payer: Self-pay

## 2021-07-02 VITALS — BP 139/52 | HR 77 | Temp 98.2°F | Resp 18

## 2021-07-02 DIAGNOSIS — D693 Immune thrombocytopenic purpura: Secondary | ICD-10-CM

## 2021-07-02 DIAGNOSIS — C9110 Chronic lymphocytic leukemia of B-cell type not having achieved remission: Secondary | ICD-10-CM

## 2021-07-02 LAB — CMP (CANCER CENTER ONLY)
ALT: 16 U/L (ref 0–44)
AST: 26 U/L (ref 15–41)
Albumin: 3.5 g/dL (ref 3.5–5.0)
Alkaline Phosphatase: 48 U/L (ref 38–126)
Anion gap: 5 (ref 5–15)
BUN: 21 mg/dL (ref 8–23)
CO2: 26 mmol/L (ref 22–32)
Calcium: 9 mg/dL (ref 8.9–10.3)
Chloride: 108 mmol/L (ref 98–111)
Creatinine: 0.9 mg/dL (ref 0.44–1.00)
GFR, Estimated: >60 mL/min (ref >60)
Glucose, Bld: 93 mg/dL (ref 70–99)
Potassium: 4.2 mmol/L (ref 3.5–5.1)
Sodium: 139 mmol/L (ref 135–145)
Total Bilirubin: 0.3 mg/dL (ref 0.3–1.2)
Total Protein: 8 g/dL (ref 6.5–8.1)

## 2021-07-02 LAB — CBC WITH DIFFERENTIAL (CANCER CENTER ONLY)
Abs Immature Granulocytes: 0.12 10*3/uL — ABNORMAL HIGH (ref 0.00–0.07)
Basophils Absolute: 0.1 10*3/uL (ref 0.0–0.1)
Basophils Relative: 0 %
Eosinophils Absolute: 0.1 10*3/uL (ref 0.0–0.5)
Eosinophils Relative: 1 %
HCT: 33.2 % — ABNORMAL LOW (ref 36.0–46.0)
Hemoglobin: 10.7 g/dL — ABNORMAL LOW (ref 12.0–15.0)
Immature Granulocytes: 1 %
Lymphocytes Relative: 82 %
Lymphs Abs: 21.6 10*3/uL — ABNORMAL HIGH (ref 0.7–4.0)
MCH: 32.5 pg (ref 26.0–34.0)
MCHC: 32.2 g/dL (ref 30.0–36.0)
MCV: 100.9 fL — ABNORMAL HIGH (ref 80.0–100.0)
Monocytes Absolute: 0.7 10*3/uL (ref 0.1–1.0)
Monocytes Relative: 3 %
Neutro Abs: 3.3 10*3/uL (ref 1.7–7.7)
Neutrophils Relative %: 13 %
Platelet Count: 134 10*3/uL — ABNORMAL LOW (ref 150–400)
RBC: 3.29 MIL/uL — ABNORMAL LOW (ref 3.87–5.11)
RDW: 14.5 % (ref 11.5–15.5)
WBC Count: 25.9 10*3/uL — ABNORMAL HIGH (ref 4.0–10.5)
nRBC: 0 % (ref 0.0–0.2)

## 2021-07-02 LAB — LACTATE DEHYDROGENASE: LDH: 190 U/L (ref 98–192)

## 2021-07-02 MED ORDER — ROMIPLOSTIM 125 MCG ~~LOC~~ SOLR
100.0000 ug | SUBCUTANEOUS | Status: DC
  Administered 2021-07-02: 100 ug via SUBCUTANEOUS
  Filled 2021-07-02: qty 0.2

## 2021-07-02 NOTE — Patient Instructions (Signed)
Romiplostim injection ?What is this medication? ?ROMIPLOSTIM ([place] mi PLOE stim) helps your body make more platelets. This medicine is used to treat low platelets caused by chronic idiopathic thrombocytopenic purpura (ITP) or a bone marrow syndrome caused by radiation sickness. ?This medicine may be used for other purposes; ask your health care provider or pharmacist if you have questions. ?COMMON BRAND NAME(S): Nplate ?What should I tell my care team before I take this medication? ?They need to know if you have any of these conditions: ?blood clots ?myelodysplastic syndrome ?an unusual or allergic reaction to romiplostim, mannitol, other medicines, foods, dyes, or preservatives ?pregnant or trying to get pregnant ?breast-feeding ?How should I use this medication? ?This medicine is injected under the skin. It is given by a health care provider in a hospital or clinic setting. ?A special MedGuide will be given to you before each treatment. Be sure to read this information carefully each time. ?Talk to your health care provider about the use of this medicine in children. While it may be prescribed for children as young as newborns for selected conditions, precautions do apply. ?Overdosage: If you think you have taken too much of this medicine contact a poison control center or emergency room at once. ?NOTE: This medicine is only for you. Do not share this medicine with others. ?What if I miss a dose? ?Keep appointments for follow-up doses. It is important not to miss your dose. Call your health care provider if you are unable to keep an appointment. ?What may interact with this medication? ?Interactions are not expected. ?This list may not describe all possible interactions. Give your health care provider a list of all the medicines, herbs, non-prescription drugs, or dietary supplements you use. Also tell them if you smoke, drink alcohol, or use illegal drugs. Some items may interact with your medicine. ?What should I  watch for while using this medication? ?Visit your health care provider for regular checks on your progress. You may need blood work done while you are taking this medicine. Your condition will be monitored carefully while you are receiving this medicine. It is important not to miss any appointments. ?What side effects may I notice from receiving this medication? ?Side effects that you should report to your doctor or health care professional as soon as possible: ?allergic reactions (skin rash, itching or hives; swelling of the face, lips, or tongue) ?bleeding (bloody or black, tarry stools; red or dark brown urine; spitting up blood or brown material that looks like coffee grounds; red spots on the skin; unusual bruising or bleeding from the eyes, gums, or nose) ?blood clot (chest pain; shortness of breath; pain, swelling, or warmth in the leg) ?stroke (changes in vision; confusion; trouble speaking or understanding; severe headaches; sudden numbness or weakness of the face, arm or leg; trouble walking; dizziness; loss of balance or coordination) ?Side effects that usually do not require medical attention (report to your doctor or health care professional if they continue or are bothersome): ?diarrhea ?dizziness ?headache ?joint pain ?muscle pain ?stomach pain ?trouble sleeping ?This list may not describe all possible side effects. Call your doctor for medical advice about side effects. You may report side effects to FDA at 1-800-FDA-1088. ?Where should I keep my medication? ?This medicine is given in a hospital or clinic. It will not be stored at home. ?NOTE: This sheet is a summary. It may not cover all possible information. If you have questions about this medicine, talk to your doctor, pharmacist, or health care provider. ??   2023 Elsevier/Gold Standard (2021-01-31 00:00:00) ? ?

## 2021-07-09 ENCOUNTER — Inpatient Hospital Stay: Payer: Medicare HMO

## 2021-07-10 ENCOUNTER — Inpatient Hospital Stay: Payer: Medicare HMO

## 2021-07-10 ENCOUNTER — Other Ambulatory Visit: Payer: Self-pay | Admitting: Hematology

## 2021-07-10 VITALS — BP 122/48 | HR 72 | Temp 97.8°F | Resp 18

## 2021-07-10 DIAGNOSIS — D693 Immune thrombocytopenic purpura: Secondary | ICD-10-CM | POA: Diagnosis not present

## 2021-07-10 DIAGNOSIS — C9110 Chronic lymphocytic leukemia of B-cell type not having achieved remission: Secondary | ICD-10-CM

## 2021-07-10 LAB — CMP (CANCER CENTER ONLY)
ALT: 12 U/L (ref 0–44)
AST: 22 U/L (ref 15–41)
Albumin: 3.7 g/dL (ref 3.5–5.0)
Alkaline Phosphatase: 54 U/L (ref 38–126)
Anion gap: 6 (ref 5–15)
BUN: 20 mg/dL (ref 8–23)
CO2: 26 mmol/L (ref 22–32)
Calcium: 8.9 mg/dL (ref 8.9–10.3)
Chloride: 104 mmol/L (ref 98–111)
Creatinine: 0.8 mg/dL (ref 0.44–1.00)
GFR, Estimated: >60 mL/min
Glucose, Bld: 92 mg/dL (ref 70–99)
Potassium: 4.3 mmol/L (ref 3.5–5.1)
Sodium: 136 mmol/L (ref 135–145)
Total Bilirubin: 0.3 mg/dL (ref 0.3–1.2)
Total Protein: 7.9 g/dL (ref 6.5–8.1)

## 2021-07-10 LAB — CBC WITH DIFFERENTIAL (CANCER CENTER ONLY)
Abs Immature Granulocytes: 0.18 K/uL — ABNORMAL HIGH (ref 0.00–0.07)
Basophils Absolute: 0.1 K/uL (ref 0.0–0.1)
Basophils Relative: 0 %
Eosinophils Absolute: 0.1 K/uL (ref 0.0–0.5)
Eosinophils Relative: 0 %
HCT: 33.7 % — ABNORMAL LOW (ref 36.0–46.0)
Hemoglobin: 10.8 g/dL — ABNORMAL LOW (ref 12.0–15.0)
Immature Granulocytes: 1 %
Lymphocytes Relative: 84 %
Lymphs Abs: 20.5 K/uL — ABNORMAL HIGH (ref 0.7–4.0)
MCH: 32.1 pg (ref 26.0–34.0)
MCHC: 32 g/dL (ref 30.0–36.0)
MCV: 100.3 fL — ABNORMAL HIGH (ref 80.0–100.0)
Monocytes Absolute: 0.6 K/uL (ref 0.1–1.0)
Monocytes Relative: 2 %
Neutro Abs: 3.2 K/uL (ref 1.7–7.7)
Neutrophils Relative %: 13 %
Platelet Count: 146 K/uL — ABNORMAL LOW (ref 150–400)
RBC: 3.36 MIL/uL — ABNORMAL LOW (ref 3.87–5.11)
RDW: 14.3 % (ref 11.5–15.5)
Smear Review: NORMAL
WBC Count: 24.6 K/uL — ABNORMAL HIGH (ref 4.0–10.5)
nRBC: 0 % (ref 0.0–0.2)

## 2021-07-10 LAB — LACTATE DEHYDROGENASE: LDH: 210 U/L — ABNORMAL HIGH (ref 98–192)

## 2021-07-10 MED ORDER — ROMIPLOSTIM 125 MCG ~~LOC~~ SOLR
100.0000 ug | SUBCUTANEOUS | Status: DC
  Administered 2021-07-10: 100 ug via SUBCUTANEOUS
  Filled 2021-07-10: qty 0.2

## 2021-07-10 MED ORDER — CETIRIZINE HCL 10 MG PO TABS: 10.0000 mg | ORAL_TABLET | Freq: Every day | ORAL | 0 refills | Status: DC

## 2021-07-10 MED ORDER — SULFAMETHOXAZOLE-TRIMETHOPRIM 800-160 MG PO TABS: 1.0000 | ORAL_TABLET | Freq: Two times a day (BID) | ORAL | 0 refills | Status: AC

## 2021-07-10 NOTE — Patient Instructions (Signed)
Romiplostim injection ?What is this medication? ?ROMIPLOSTIM ([place] mi PLOE stim) helps your body make more platelets. This medicine is used to treat low platelets caused by chronic idiopathic thrombocytopenic purpura (ITP) or a bone marrow syndrome caused by radiation sickness. ?This medicine may be used for other purposes; ask your health care provider or pharmacist if you have questions. ?COMMON BRAND NAME(S): Nplate ?What should I tell my care team before I take this medication? ?They need to know if you have any of these conditions: ?blood clots ?myelodysplastic syndrome ?an unusual or allergic reaction to romiplostim, mannitol, other medicines, foods, dyes, or preservatives ?pregnant or trying to get pregnant ?breast-feeding ?How should I use this medication? ?This medicine is injected under the skin. It is given by a health care provider in a hospital or clinic setting. ?A special MedGuide will be given to you before each treatment. Be sure to read this information carefully each time. ?Talk to your health care provider about the use of this medicine in children. While it may be prescribed for children as young as newborns for selected conditions, precautions do apply. ?Overdosage: If you think you have taken too much of this medicine contact a poison control center or emergency room at once. ?NOTE: This medicine is only for you. Do not share this medicine with others. ?What if I miss a dose? ?Keep appointments for follow-up doses. It is important not to miss your dose. Call your health care provider if you are unable to keep an appointment. ?What may interact with this medication? ?Interactions are not expected. ?This list may not describe all possible interactions. Give your health care provider a list of all the medicines, herbs, non-prescription drugs, or dietary supplements you use. Also tell them if you smoke, drink alcohol, or use illegal drugs. Some items may interact with your medicine. ?What should I  watch for while using this medication? ?Visit your health care provider for regular checks on your progress. You may need blood work done while you are taking this medicine. Your condition will be monitored carefully while you are receiving this medicine. It is important not to miss any appointments. ?What side effects may I notice from receiving this medication? ?Side effects that you should report to your doctor or health care professional as soon as possible: ?allergic reactions (skin rash, itching or hives; swelling of the face, lips, or tongue) ?bleeding (bloody or black, tarry stools; red or dark brown urine; spitting up blood or brown material that looks like coffee grounds; red spots on the skin; unusual bruising or bleeding from the eyes, gums, or nose) ?blood clot (chest pain; shortness of breath; pain, swelling, or warmth in the leg) ?stroke (changes in vision; confusion; trouble speaking or understanding; severe headaches; sudden numbness or weakness of the face, arm or leg; trouble walking; dizziness; loss of balance or coordination) ?Side effects that usually do not require medical attention (report to your doctor or health care professional if they continue or are bothersome): ?diarrhea ?dizziness ?headache ?joint pain ?muscle pain ?stomach pain ?trouble sleeping ?This list may not describe all possible side effects. Call your doctor for medical advice about side effects. You may report side effects to FDA at 1-800-FDA-1088. ?Where should I keep my medication? ?This medicine is given in a hospital or clinic. It will not be stored at home. ?NOTE: This sheet is a summary. It may not cover all possible information. If you have questions about this medicine, talk to your doctor, pharmacist, or health care provider. ??   2023 Elsevier/Gold Standard (2021-01-31 00:00:00) ? ?

## 2021-07-16 ENCOUNTER — Other Ambulatory Visit: Payer: Self-pay

## 2021-07-16 ENCOUNTER — Inpatient Hospital Stay: Payer: Medicare HMO | Attending: Hematology

## 2021-07-16 ENCOUNTER — Inpatient Hospital Stay: Payer: Medicare HMO

## 2021-07-16 VITALS — BP 140/78 | HR 71 | Temp 98.3°F | Resp 18

## 2021-07-16 DIAGNOSIS — D693 Immune thrombocytopenic purpura: Secondary | ICD-10-CM

## 2021-07-16 DIAGNOSIS — C9110 Chronic lymphocytic leukemia of B-cell type not having achieved remission: Secondary | ICD-10-CM | POA: Diagnosis present

## 2021-07-16 LAB — CBC WITH DIFFERENTIAL (CANCER CENTER ONLY)
Abs Immature Granulocytes: 0.19 10*3/uL — ABNORMAL HIGH (ref 0.00–0.07)
Basophils Absolute: 0.1 10*3/uL (ref 0.0–0.1)
Basophils Relative: 0 %
Eosinophils Absolute: 0.2 10*3/uL (ref 0.0–0.5)
Eosinophils Relative: 1 %
HCT: 33.4 % — ABNORMAL LOW (ref 36.0–46.0)
Hemoglobin: 10.8 g/dL — ABNORMAL LOW (ref 12.0–15.0)
Immature Granulocytes: 1 %
Lymphocytes Relative: 84 %
Lymphs Abs: 28.1 10*3/uL — ABNORMAL HIGH (ref 0.7–4.0)
MCH: 31.7 pg (ref 26.0–34.0)
MCHC: 32.3 g/dL (ref 30.0–36.0)
MCV: 97.9 fL (ref 80.0–100.0)
Monocytes Absolute: 0.6 10*3/uL (ref 0.1–1.0)
Monocytes Relative: 2 %
Neutro Abs: 3.8 10*3/uL (ref 1.7–7.7)
Neutrophils Relative %: 12 %
Platelet Count: 174 10*3/uL (ref 150–400)
RBC: 3.41 MIL/uL — ABNORMAL LOW (ref 3.87–5.11)
RDW: 14.4 % (ref 11.5–15.5)
Smear Review: NORMAL
WBC Count: 33.1 10*3/uL — ABNORMAL HIGH (ref 4.0–10.5)
nRBC: 0 % (ref 0.0–0.2)

## 2021-07-16 LAB — CMP (CANCER CENTER ONLY)
ALT: 11 U/L (ref 0–44)
AST: 21 U/L (ref 15–41)
Albumin: 3.6 g/dL (ref 3.5–5.0)
Alkaline Phosphatase: 58 U/L (ref 38–126)
Anion gap: 8 (ref 5–15)
BUN: 21 mg/dL (ref 8–23)
CO2: 24 mmol/L (ref 22–32)
Calcium: 9 mg/dL (ref 8.9–10.3)
Chloride: 102 mmol/L (ref 98–111)
Creatinine: 1.13 mg/dL — ABNORMAL HIGH (ref 0.44–1.00)
GFR, Estimated: 48 mL/min — ABNORMAL LOW (ref >60)
Glucose, Bld: 91 mg/dL (ref 70–99)
Potassium: 4.9 mmol/L (ref 3.5–5.1)
Sodium: 134 mmol/L — ABNORMAL LOW (ref 135–145)
Total Bilirubin: 0.3 mg/dL (ref 0.3–1.2)
Total Protein: 7.7 g/dL (ref 6.5–8.1)

## 2021-07-16 LAB — LACTATE DEHYDROGENASE: LDH: 192 U/L (ref 98–192)

## 2021-07-16 MED ORDER — ROMIPLOSTIM 125 MCG ~~LOC~~ SOLR
50.0000 ug | SUBCUTANEOUS | Status: DC
  Administered 2021-07-16: 50 ug via SUBCUTANEOUS
  Filled 2021-07-16: qty 0.1

## 2021-07-16 NOTE — Progress Notes (Signed)
Decrease nplate dose by 59mg/kg per Dr KIrene Limbo?

## 2021-07-21 ENCOUNTER — Other Ambulatory Visit: Payer: Self-pay

## 2021-07-21 DIAGNOSIS — C9110 Chronic lymphocytic leukemia of B-cell type not having achieved remission: Secondary | ICD-10-CM

## 2021-07-23 ENCOUNTER — Other Ambulatory Visit: Payer: Self-pay

## 2021-07-23 ENCOUNTER — Inpatient Hospital Stay: Payer: Medicare HMO

## 2021-07-23 VITALS — BP 133/52 | HR 68 | Temp 98.6°F | Resp 16

## 2021-07-23 DIAGNOSIS — D693 Immune thrombocytopenic purpura: Secondary | ICD-10-CM

## 2021-07-23 DIAGNOSIS — C9110 Chronic lymphocytic leukemia of B-cell type not having achieved remission: Secondary | ICD-10-CM

## 2021-07-23 LAB — CBC WITH DIFFERENTIAL (CANCER CENTER ONLY)
Abs Immature Granulocytes: 0.15 10*3/uL — ABNORMAL HIGH (ref 0.00–0.07)
Basophils Absolute: 0.1 10*3/uL (ref 0.0–0.1)
Basophils Relative: 0 %
Eosinophils Absolute: 0.2 10*3/uL (ref 0.0–0.5)
Eosinophils Relative: 1 %
HCT: 32.2 % — ABNORMAL LOW (ref 36.0–46.0)
Hemoglobin: 10.4 g/dL — ABNORMAL LOW (ref 12.0–15.0)
Immature Granulocytes: 1 %
Lymphocytes Relative: 86 %
Lymphs Abs: 27.9 10*3/uL — ABNORMAL HIGH (ref 0.7–4.0)
MCH: 31.3 pg (ref 26.0–34.0)
MCHC: 32.3 g/dL (ref 30.0–36.0)
MCV: 97 fL (ref 80.0–100.0)
Monocytes Absolute: 0.7 10*3/uL (ref 0.1–1.0)
Monocytes Relative: 2 %
Neutro Abs: 3.1 10*3/uL (ref 1.7–7.7)
Neutrophils Relative %: 10 %
Platelet Count: 171 10*3/uL (ref 150–400)
RBC: 3.32 MIL/uL — ABNORMAL LOW (ref 3.87–5.11)
RDW: 14.2 % (ref 11.5–15.5)
Smear Review: NORMAL
WBC Count: 32.1 10*3/uL — ABNORMAL HIGH (ref 4.0–10.5)
nRBC: 0 % (ref 0.0–0.2)

## 2021-07-23 LAB — CMP (CANCER CENTER ONLY)
ALT: 16 U/L (ref 0–44)
AST: 26 U/L (ref 15–41)
Albumin: 3.6 g/dL (ref 3.5–5.0)
Alkaline Phosphatase: 57 U/L (ref 38–126)
Anion gap: 9 (ref 5–15)
BUN: 16 mg/dL (ref 8–23)
CO2: 25 mmol/L (ref 22–32)
Calcium: 8.9 mg/dL (ref 8.9–10.3)
Chloride: 102 mmol/L (ref 98–111)
Creatinine: 0.98 mg/dL (ref 0.44–1.00)
GFR, Estimated: 57 mL/min — ABNORMAL LOW (ref >60)
Glucose, Bld: 87 mg/dL (ref 70–99)
Potassium: 4.9 mmol/L (ref 3.5–5.1)
Sodium: 136 mmol/L (ref 135–145)
Total Bilirubin: 0.3 mg/dL (ref 0.3–1.2)
Total Protein: 8.1 g/dL (ref 6.5–8.1)

## 2021-07-23 LAB — LACTATE DEHYDROGENASE: LDH: 178 U/L (ref 98–192)

## 2021-07-23 MED ORDER — ROMIPLOSTIM 125 MCG ~~LOC~~ SOLR
50.0000 ug | SUBCUTANEOUS | Status: DC
  Administered 2021-07-23: 50 ug via SUBCUTANEOUS
  Filled 2021-07-23: qty 0.1

## 2021-07-29 ENCOUNTER — Other Ambulatory Visit: Payer: Self-pay | Admitting: *Deleted

## 2021-07-29 DIAGNOSIS — D693 Immune thrombocytopenic purpura: Secondary | ICD-10-CM

## 2021-07-29 DIAGNOSIS — C8300 Small cell B-cell lymphoma, unspecified site: Secondary | ICD-10-CM

## 2021-07-30 ENCOUNTER — Inpatient Hospital Stay (HOSPITAL_BASED_OUTPATIENT_CLINIC_OR_DEPARTMENT_OTHER): Payer: Medicare HMO | Admitting: Hematology

## 2021-07-30 ENCOUNTER — Other Ambulatory Visit: Payer: Self-pay

## 2021-07-30 ENCOUNTER — Inpatient Hospital Stay: Payer: Medicare HMO

## 2021-07-30 VITALS — BP 133/81 | HR 18 | Temp 97.7°F | Resp 18 | Wt 112.9 lb

## 2021-07-30 DIAGNOSIS — C9110 Chronic lymphocytic leukemia of B-cell type not having achieved remission: Secondary | ICD-10-CM | POA: Diagnosis not present

## 2021-07-30 DIAGNOSIS — D693 Immune thrombocytopenic purpura: Secondary | ICD-10-CM | POA: Diagnosis not present

## 2021-07-30 DIAGNOSIS — C8300 Small cell B-cell lymphoma, unspecified site: Secondary | ICD-10-CM

## 2021-07-30 LAB — CBC WITH DIFFERENTIAL (CANCER CENTER ONLY)
Abs Immature Granulocytes: 0.06 10*3/uL (ref 0.00–0.07)
Basophils Absolute: 0.1 10*3/uL (ref 0.0–0.1)
Basophils Relative: 0 %
Eosinophils Absolute: 0.1 10*3/uL (ref 0.0–0.5)
Eosinophils Relative: 1 %
HCT: 33 % — ABNORMAL LOW (ref 36.0–46.0)
Hemoglobin: 10.7 g/dL — ABNORMAL LOW (ref 12.0–15.0)
Immature Granulocytes: 0 %
Lymphocytes Relative: 87 %
Lymphs Abs: 20 10*3/uL — ABNORMAL HIGH (ref 0.7–4.0)
MCH: 31.5 pg (ref 26.0–34.0)
MCHC: 32.4 g/dL (ref 30.0–36.0)
MCV: 97.1 fL (ref 80.0–100.0)
Monocytes Absolute: 0.5 10*3/uL (ref 0.1–1.0)
Monocytes Relative: 2 %
Neutro Abs: 2.4 10*3/uL (ref 1.7–7.7)
Neutrophils Relative %: 10 %
Platelet Count: 123 10*3/uL — ABNORMAL LOW (ref 150–400)
RBC: 3.4 MIL/uL — ABNORMAL LOW (ref 3.87–5.11)
RDW: 14.2 % (ref 11.5–15.5)
Smear Review: NORMAL
WBC Count: 23.2 10*3/uL — ABNORMAL HIGH (ref 4.0–10.5)
nRBC: 0 % (ref 0.0–0.2)

## 2021-07-30 LAB — CMP (CANCER CENTER ONLY)
ALT: 13 U/L (ref 0–44)
AST: 19 U/L (ref 15–41)
Albumin: 3.4 g/dL — ABNORMAL LOW (ref 3.5–5.0)
Alkaline Phosphatase: 54 U/L (ref 38–126)
Anion gap: 7 (ref 5–15)
BUN: 18 mg/dL (ref 8–23)
CO2: 27 mmol/L (ref 22–32)
Calcium: 8.8 mg/dL — ABNORMAL LOW (ref 8.9–10.3)
Chloride: 105 mmol/L (ref 98–111)
Creatinine: 0.81 mg/dL (ref 0.44–1.00)
GFR, Estimated: >60 mL/min (ref >60)
Glucose, Bld: 86 mg/dL (ref 70–99)
Potassium: 4.3 mmol/L (ref 3.5–5.1)
Sodium: 139 mmol/L (ref 135–145)
Total Bilirubin: 0.3 mg/dL (ref 0.3–1.2)
Total Protein: 7.7 g/dL (ref 6.5–8.1)

## 2021-07-30 LAB — LACTATE DEHYDROGENASE: LDH: 161 U/L (ref 98–192)

## 2021-07-30 NOTE — Progress Notes (Signed)
Per Dr. Irene Limbo - patient will not receive Nplate injection today. Patient and family aware.  Appointment canceled.

## 2021-07-30 NOTE — Progress Notes (Signed)
HEMATOLOGY/ONCOLOGY CLINIC NOTE  Date of Service: 07/30/2021   Patient Care Team: Brianna Infante, MD as PCP - General (Internal Medicine)  CHIEF COMPLAINTS/PURPOSE OF CONSULTATION:  Follow-up for continued evaluation and management of CLL  Oncologic History:  Oncology History  Breast cancer of upper-outer quadrant of left female breast ([place])  09/16/2010 Surgery   Left breast 1.5 cm invasive ductal carcinoma with calcifications, grade 1, perineural invasion was seen, DCIS, margins negative, 2 sentinel nodes negative, ER 99%, PR negative, Ki-67 79%, HER-2 negative, Oncotype 24, 16% ROR    10/25/2010 -  Anti-estrogen oral therapy   Arimidex 1 mg daily developed side effects and was held briefly and restarted    Lymphoma, small lymphocytic ([place])  11/16/2014 Initial Diagnosis   Lymphoma, small lymphocytic: Based on cervical lymph node biopsy CD5 positive CD10 negative    CLL (chronic lymphocytic leukemia) ([place])  12/12/2020 Cancer Staging   Staging form: Chronic Lymphocytic Leukemia / Small Lymphocytic Lymphoma, AJCC 8th Edition - Clinical stage from 12/12/2020: Modified Rai Stage IV (Modified Rai risk: High, Lymphocytosis: Present, Adenopathy: Present, Organomegaly: Present, Anemia: Present, Thrombocytopenia: Present) - Signed by Brianna Genera, MD on 12/15/2020 Stage prefix: Post-therapy Response to neoadjuvant therapy: Unknown    12/15/2020 Initial Diagnosis   CLL (chronic lymphocytic leukemia) ([place])      HISTORY OF PRESENTING ILLNESS:  Please see previous note for details of initial presentation   INTERVAL HISTORY:  Brianna York is a 87 y.o. female is here for continued evaluation and management of her CLL. She is accompanied by her brother for this clinic visit. She reports She is doing well with no new symptoms or concerns.  She reports that she is eating well.  She notes that she is tolerating the Acalabrutinib 100 mg p.o. daily with no prohibitive toxicities.  We discussed that if her counts are stable then we will start increased dose Acalabrutinib.  Some decreasing swelling around her eyes and in the axilla as well as neck. Blood pressure stable.  We discussed holding Nplate at this time.  She reports severe itching with lacerations in skin caused by nails. She notes they bleed at times somewhat randomly. We further discussed potentially using a topical antibiotic for her open wounds. We discussed using the Calamine spray previously ordered. She was advised to continue to keep nails short to avoid further damage from scratching. We also discussed a potential referral to dermatology which she has declined at this time.  We discussed her brother receiving training from a nurse on how to administer Ms. Brianna York vitamin B12 shot.  No abdominal pain or distension. No other new bleeding issues. No other new or acute focal symptoms.  Lab results reviewed with the patient in detail. WBC count 23.2k platelets 123k, hemoglobin 10.7.  MEDICAL HISTORY:  Past Medical History:  Diagnosis Date   Adenomatous colon polyp    tubular   Arthritis    osteo arthritis   Breast cancer ([place]) 02/16/2011   Diverticulosis    GERD (gastroesophageal reflux disease)    Hyperlipidemia    Leukemia ([place])    Lymphoma ([place])    small lympocytic   Osteopenia     SURGICAL HISTORY: Past Surgical History:  Procedure Laterality Date   bartholian cyst  1986   BREAST LUMPECTOMY Left 09-16-2010   CATARACT EXTRACTION, [place] ARTHROSCOPY Right 2010   LYMPH GLAND EXCISION Right 11/16/2014   Procedure:  EXCISION AND BIOPSY OF RIGHT CERVICAL LYMPH NODE;  Surgeon: Brianna Confer, MD;  Location: [facility];  Service: General;  Laterality: Right;   TONSILLECTOMY  1940    SOCIAL HISTORY: Social History   Socioeconomic History   Marital status: Widowed    Spouse name: Not on file   Number of children: 5    Years of education: Not on file   Highest education level: Not on file  Occupational History   Occupation: retired  Tobacco Use   Smoking status: Former    Types: Cigarettes    Quit date: 1989    Years since quitting: 34.3   Smokeless tobacco: Never   Tobacco comments:    Quit smoking 27 years ago  Scientific laboratory technician Use: Never used  Substance and Sexual Activity   Alcohol use: Yes    Comment: occasional glass wine   Drug use: No   Sexual activity: Not Currently  Other Topics Concern   Not on file  Social History Narrative   Not on file   Social Determinants of Health   Financial Resource Strain: Not on file  Food Insecurity: Not on file  Transportation Needs: Not on file  Physical Activity: Not on file  Stress: Not on file  Social Connections: Not on file  Intimate Partner Violence: Not on file    FAMILY HISTORY: Family History  Problem Relation Age of Onset   Colon cancer Mother 80   Colon cancer Maternal Aunt        had colon sugery   Leukemia Father    Diabetes Brother    Diabetes Brother     ALLERGIES:  is allergic to ciprofloxacin, loratadine-pseudoephedrine er, penicillins, and tolterodine tartrate.  MEDICATIONS:  Current Outpatient Medications  Medication Sig Dispense Refill   Acalabrutinib Maleate (CALQUENCE) 100 MG TABS Take 100 mg by mouth daily. 30 tablet 1   alendronate (FOSAMAX) 70 MG tablet Take 70 mg by mouth once a week. Take with a full glass of water on an empty stomach.     atorvastatin (LIPITOR) 10 MG tablet Take 10 mg by mouth daily.     cetirizine (ZYRTEC) 10 MG tablet Take 1 tablet (10 mg total) by mouth at bedtime. 30 tablet 0   Cyanocobalamin (B-12) 1000 MCG SUBL Place 1,000 mcg under the tongue daily. 30 tablet 11   valACYclovir (VALTREX) 500 MG tablet TAKE ONE TABLET BY MOUTH TWICE DAILY FOR 7 DAYS 14 tablet 0   Vitamin D, Ergocalciferol, (DRISDOL) 50000 UNITS CAPS capsule Take 50,000 Units by mouth every 7 (seven) days.     No  current facility-administered medications for this visit.    REVIEW OF SYSTEMS:   10 Point review of Systems was done is negative except as noted above.  PHYSICAL EXAMINATION: ECOG PERFORMANCE STATUS: 2  . Vitals:   07/30/21 1420  BP: 133/81  Pulse: (!) 18  Resp: 18  Temp: 97.7 F (36.5 C)  SpO2: 100%    Filed Weights   07/30/21 1420  Weight: 112 lb 14.4 oz (51.2 kg)    .Body mass index is 25.31 kg/m.  NAD GENERAL:alert, in no acute distress and comfortable SKIN: no acute rashes, no significant lesions. Lacerations from scratching on arms and upper back. EYES: conjunctiva are pink and non-injected, sclera anicteric NECK: supple, no JVD LYMPH:  no palpable lymphadenopathy in the cervical, axillary or inguinal regions LUNGS: clear to auscultation b/l with normal respiratory effort HEART: regular rate & rhythm ABDOMEN:  normoactive bowel sounds , non tender, not distended. Extremity: no pedal edema PSYCH: alert & oriented x 3 with fluent speech NEURO: no focal motor/sensory deficits  LABORATORY DATA:  I have reviewed the data as listed  .    Latest Ref Rng & Units 07/30/2021    2:13 PM 07/23/2021    1:42 PM 07/16/2021    2:08 PM  CBC  WBC 4.0 - 10.5 K/uL 23.2   32.1   33.1    Hemoglobin 12.0 - 15.0 g/dL 10.7   10.4   10.8    Hematocrit 36.0 - 46.0 % 33.0   32.2   33.4    Platelets 150 - 400 K/uL 123   171   174      .    Latest Ref Rng & Units 07/30/2021    2:13 PM 07/23/2021    1:42 PM 07/16/2021    2:08 PM  CMP  Glucose 70 - 99 mg/dL 86   87   91    BUN 8 - 23 mg/dL 18   16   21     Creatinine 0.44 - 1.00 mg/dL 0.81   0.98   1.13    Sodium 135 - 145 mmol/L 139   136   134    Potassium 3.5 - 5.1 mmol/L 4.3   4.9   4.9    Chloride 98 - 111 mmol/L 105   102   102    CO2 22 - 32 mmol/L 27   25   24     Calcium 8.9 - 10.3 mg/dL 8.8   8.9   9.0    Total Protein 6.5 - 8.1 g/dL 7.7   8.1   7.7    Total Bilirubin 0.3 - 1.2 mg/dL 0.3   0.3   0.3    Alkaline Phos 38  - 126 U/L 54   57   58    AST 15 - 41 U/L 19   26   21     ALT 0 - 44 U/L 13   16   11      Lab Results  Component Value Date   LDH 178 07/23/2021     Surgical Pathology  CASE: [id number]  PATIENT: Vieno Leifheit  Flow Pathology Report      Clinical history: right orbital mass   DIAGNOSIS:   - Monoclonal B-cell population identified, see comment.   COMMENT:   There is a monoclonal population of B-cells with expression of CD5 and  CD200. The phenotype is consistent with the patient's history of small  lymphocytic lymphoma.   02/21/18 Left Lacrimal sac biopsy:    12/18/14 Cytogenetics:    12/04/14 Right cervical lymph node biopsy:   06/27/18 FISH CLL Prognostic Panel:    RADIOGRAPHIC STUDIES: I have personally reviewed the radiological images as listed and agreed with the findings in the report. No results found.  ASSESSMENT & PLAN:   87 y.o. female with  1. Chronic Lymphocytic Leukemia 11/16/14 Right cervical lymph node biopsy revealed SLL/CLL 12/18/14 Cytogenetics revealed 59% of cells with gain of CEP12 and 5% of cells with loss of ATM 02/21/18 Right lacrimal gland revealed lymphoid infiltrates 06/27/18 FISH CLL Prognostic Panel revealed 49.33% of cells with Trisomy 12, and only 2% of cells with 17p mutation  08/03/18 PET/CT revealed "Exam positive for FDG avid adenopathy within the neck, chest, abdomen and pelvis compatible with the clinical history of chronic lymphocytic leukemia. 2. Splenomegaly (about 13cm) with mild increased radiotracer uptake throughout the  spleen (above blood pool activity)".   2. History of Invasive Ductal Carcinoma 09/16/10 Left breast 1.5cm IDC with calcifications, grade 1, perineural invasion. DCIS margins negative, 2 sentinel nodes negative, ER 99%, PR negative, Ki-67 at 79%, HER-2 negative, Oncotype 24, 16% ROR Continues on 43m Anastrozole daily, began on 10/25/10. Last available Mammogram on 12/31/16 negative  3.  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  Recent right orbital biopsy also showed CLL/SLL.  Has previously had radiation treatment with temporary improvement.  4.  Thrombocytopenia related to CLL, splenomegaly and possible element of ITP  PLAN:  -Lab results reviewed with the patient in detail. WBC count 23.2k platelets 123k, hemoglobin 10.7. LDH at 161. -Continue B complex 1 capsule p.o. daily -She is on Nplate 2 mcg/kg to maintain platelets of more than 50k to reduce the risk of bleeding or bruising from BTK inhibitor. -We will hold Nplate at this time -Continue Acalabrutinib 100 mg p.o. daily to try to reduce amount of bleeding/bruising. -Patient tolerates once daily of a acalabrutinib will increase to twice daily dosing. -Clinically the patient's cervical and axillary lymphadenopathy has significantly improved. -Pt advised to use Calamine spray for itching. -recommend she f/u with her dermatologist as well. -Pt advised to continue to keep nails short to avoid further damage from scratching. -We discussed a potential referral to dermatology which she has declined at this time. -We discussed her brother receiving training from a nurse on how to administer Ms. PPercell MillerMorris vitamin B12 shot. They will decide and RTC if preferred.  -Weekly labs x2. -RTC with labs in 4 weeks.   FOLLOW UP: -Holding Nplate at this time -weekly labs x 2 -RTC with Dr KIrene Limbowith labs in 4 weeks   The total time spent in the appointment was 30 minutes*.  All of the patient's questions were answered with apparent satisfaction. The patient knows to call the clinic with any problems, questions or concerns.   GSullivan LoneMD MS AAHIVMS SDe La [facility]ina SurgicenterCCorp[facility] Christ[facility] Christ[facility] Outpatient Surgery CenterHematology/Oncology Physician CK[facility] .*Total Encounter Time as defined by the Centers for Medicare and Medicaid Services includes, in addition to the face-to-face time of a patient visit (documented in the note above)  non-face-to-face time: obtaining and reviewing outside history, ordering and reviewing medications, tests or procedures, care coordination (communications with other health care professionals or caregivers) and documentation in the medical record.  I, GMelene Muller am acting as scribe for Dr. GSullivan Lone MD.  .I have reviewed the above documentation for accuracy and completeness, and I agree with the above. .Brianna GeneraMD

## 2021-08-04 ENCOUNTER — Telehealth: Payer: Self-pay | Admitting: Hematology

## 2021-08-04 NOTE — Telephone Encounter (Signed)
Scheduled follow-up appointment per 5/17 los. Patient's brother is aware.

## 2021-08-05 ENCOUNTER — Encounter: Payer: Self-pay | Admitting: Hematology

## 2021-08-05 ENCOUNTER — Other Ambulatory Visit: Payer: Self-pay

## 2021-08-05 DIAGNOSIS — C9110 Chronic lymphocytic leukemia of B-cell type not having achieved remission: Secondary | ICD-10-CM

## 2021-08-06 ENCOUNTER — Inpatient Hospital Stay: Payer: Medicare HMO

## 2021-08-06 ENCOUNTER — Other Ambulatory Visit: Payer: Self-pay | Admitting: Hematology

## 2021-08-07 ENCOUNTER — Inpatient Hospital Stay: Payer: Medicare HMO

## 2021-08-07 ENCOUNTER — Other Ambulatory Visit: Payer: Self-pay

## 2021-08-07 DIAGNOSIS — D693 Immune thrombocytopenic purpura: Secondary | ICD-10-CM | POA: Diagnosis not present

## 2021-08-07 DIAGNOSIS — C9110 Chronic lymphocytic leukemia of B-cell type not having achieved remission: Secondary | ICD-10-CM

## 2021-08-07 LAB — CBC WITH DIFFERENTIAL (CANCER CENTER ONLY)
Abs Immature Granulocytes: 0.03 10*3/uL (ref 0.00–0.07)
Basophils Absolute: 0 10*3/uL (ref 0.0–0.1)
Basophils Relative: 0 %
Eosinophils Absolute: 0.1 10*3/uL (ref 0.0–0.5)
Eosinophils Relative: 1 %
HCT: 31.2 % — ABNORMAL LOW (ref 36.0–46.0)
Hemoglobin: 10 g/dL — ABNORMAL LOW (ref 12.0–15.0)
Immature Granulocytes: 0 %
Lymphocytes Relative: 85 %
Lymphs Abs: 17.1 10*3/uL — ABNORMAL HIGH (ref 0.7–4.0)
MCH: 31.2 pg (ref 26.0–34.0)
MCHC: 32.1 g/dL (ref 30.0–36.0)
MCV: 97.2 fL (ref 80.0–100.0)
Monocytes Absolute: 0.5 10*3/uL (ref 0.1–1.0)
Monocytes Relative: 2 %
Neutro Abs: 2.3 10*3/uL (ref 1.7–7.7)
Neutrophils Relative %: 12 %
Platelet Count: 83 10*3/uL — ABNORMAL LOW (ref 150–400)
RBC: 3.21 MIL/uL — ABNORMAL LOW (ref 3.87–5.11)
RDW: 14.2 % (ref 11.5–15.5)
Smear Review: NORMAL
WBC Count: 20.1 10*3/uL — ABNORMAL HIGH (ref 4.0–10.5)
nRBC: 0 % (ref 0.0–0.2)

## 2021-08-07 LAB — CMP (CANCER CENTER ONLY)
ALT: 10 U/L (ref 0–44)
AST: 18 U/L (ref 15–41)
Albumin: 3.5 g/dL (ref 3.5–5.0)
Alkaline Phosphatase: 61 U/L (ref 38–126)
Anion gap: 5 (ref 5–15)
BUN: 15 mg/dL (ref 8–23)
CO2: 27 mmol/L (ref 22–32)
Calcium: 8.9 mg/dL (ref 8.9–10.3)
Chloride: 107 mmol/L (ref 98–111)
Creatinine: 0.87 mg/dL (ref 0.44–1.00)
GFR, Estimated: >60 mL/min (ref >60)
Glucose, Bld: 105 mg/dL — ABNORMAL HIGH (ref 70–99)
Potassium: 4.5 mmol/L (ref 3.5–5.1)
Sodium: 139 mmol/L (ref 135–145)
Total Bilirubin: 0.4 mg/dL (ref 0.3–1.2)
Total Protein: 7.2 g/dL (ref 6.5–8.1)

## 2021-08-07 LAB — LACTATE DEHYDROGENASE: LDH: 175 U/L (ref 98–192)

## 2021-08-12 ENCOUNTER — Other Ambulatory Visit: Payer: Self-pay

## 2021-08-12 DIAGNOSIS — C9110 Chronic lymphocytic leukemia of B-cell type not having achieved remission: Secondary | ICD-10-CM

## 2021-08-12 MED ORDER — CALQUENCE 100 MG PO TABS: 100.0000 mg | ORAL_TABLET | Freq: Every day | ORAL | 1 refills | Status: DC

## 2021-08-13 ENCOUNTER — Inpatient Hospital Stay: Payer: Medicare HMO

## 2021-08-13 DIAGNOSIS — D693 Immune thrombocytopenic purpura: Secondary | ICD-10-CM | POA: Diagnosis not present

## 2021-08-13 DIAGNOSIS — C9110 Chronic lymphocytic leukemia of B-cell type not having achieved remission: Secondary | ICD-10-CM

## 2021-08-13 LAB — CBC WITH DIFFERENTIAL (CANCER CENTER ONLY)
Abs Immature Granulocytes: 0.03 10*3/uL (ref 0.00–0.07)
Basophils Absolute: 0.1 10*3/uL (ref 0.0–0.1)
Basophils Relative: 0 %
Eosinophils Absolute: 0.2 10*3/uL (ref 0.0–0.5)
Eosinophils Relative: 1 %
HCT: 32.6 % — ABNORMAL LOW (ref 36.0–46.0)
Hemoglobin: 10.3 g/dL — ABNORMAL LOW (ref 12.0–15.0)
Immature Granulocytes: 0 %
Lymphocytes Relative: 86 %
Lymphs Abs: 19.2 10*3/uL — ABNORMAL HIGH (ref 0.7–4.0)
MCH: 30.1 pg (ref 26.0–34.0)
MCHC: 31.6 g/dL (ref 30.0–36.0)
MCV: 95.3 fL (ref 80.0–100.0)
Monocytes Absolute: 0.5 10*3/uL (ref 0.1–1.0)
Monocytes Relative: 2 %
Neutro Abs: 2.4 10*3/uL (ref 1.7–7.7)
Neutrophils Relative %: 11 %
Platelet Count: 63 10*3/uL — ABNORMAL LOW (ref 150–400)
RBC: 3.42 MIL/uL — ABNORMAL LOW (ref 3.87–5.11)
RDW: 14.3 % (ref 11.5–15.5)
Smear Review: NORMAL
WBC Count: 22.2 10*3/uL — ABNORMAL HIGH (ref 4.0–10.5)
nRBC: 0 % (ref 0.0–0.2)

## 2021-08-13 LAB — CMP (CANCER CENTER ONLY)
ALT: 15 U/L (ref 0–44)
AST: 23 U/L (ref 15–41)
Albumin: 3.6 g/dL (ref 3.5–5.0)
Alkaline Phosphatase: 57 U/L (ref 38–126)
Anion gap: 6 (ref 5–15)
BUN: 18 mg/dL (ref 8–23)
CO2: 24 mmol/L (ref 22–32)
Calcium: 8.9 mg/dL (ref 8.9–10.3)
Chloride: 109 mmol/L (ref 98–111)
Creatinine: 0.81 mg/dL (ref 0.44–1.00)
GFR, Estimated: >60 mL/min (ref >60)
Glucose, Bld: 85 mg/dL (ref 70–99)
Potassium: 4.3 mmol/L (ref 3.5–5.1)
Sodium: 139 mmol/L (ref 135–145)
Total Bilirubin: 0.3 mg/dL (ref 0.3–1.2)
Total Protein: 7.7 g/dL (ref 6.5–8.1)

## 2021-08-13 LAB — LACTATE DEHYDROGENASE: LDH: 156 U/L (ref 98–192)

## 2021-08-26 ENCOUNTER — Other Ambulatory Visit: Payer: Self-pay

## 2021-08-26 ENCOUNTER — Other Ambulatory Visit: Payer: Self-pay | Admitting: Hematology

## 2021-08-26 DIAGNOSIS — C9110 Chronic lymphocytic leukemia of B-cell type not having achieved remission: Secondary | ICD-10-CM

## 2021-08-27 ENCOUNTER — Other Ambulatory Visit: Payer: Self-pay

## 2021-08-27 ENCOUNTER — Encounter: Payer: Self-pay | Admitting: Hematology

## 2021-08-27 ENCOUNTER — Inpatient Hospital Stay: Payer: Medicare HMO | Attending: Hematology | Admitting: Hematology

## 2021-08-27 ENCOUNTER — Inpatient Hospital Stay: Payer: Medicare HMO

## 2021-08-27 VITALS — BP 118/59 | HR 75 | Temp 97.9°F | Resp 18 | Ht <= 58 in | Wt 117.1 lb

## 2021-08-27 DIAGNOSIS — Z79899 Other long term (current) drug therapy: Secondary | ICD-10-CM | POA: Insufficient documentation

## 2021-08-27 DIAGNOSIS — Z853 Personal history of malignant neoplasm of breast: Secondary | ICD-10-CM | POA: Insufficient documentation

## 2021-08-27 DIAGNOSIS — C9110 Chronic lymphocytic leukemia of B-cell type not having achieved remission: Secondary | ICD-10-CM | POA: Insufficient documentation

## 2021-08-27 DIAGNOSIS — D693 Immune thrombocytopenic purpura: Secondary | ICD-10-CM | POA: Diagnosis present

## 2021-08-27 LAB — CBC WITH DIFFERENTIAL (CANCER CENTER ONLY)
Abs Immature Granulocytes: 0.03 10*3/uL (ref 0.00–0.07)
Basophils Absolute: 0.1 10*3/uL (ref 0.0–0.1)
Basophils Relative: 0 %
Eosinophils Absolute: 0.1 10*3/uL (ref 0.0–0.5)
Eosinophils Relative: 1 %
HCT: 32.2 % — ABNORMAL LOW (ref 36.0–46.0)
Hemoglobin: 10.4 g/dL — ABNORMAL LOW (ref 12.0–15.0)
Immature Granulocytes: 0 %
Lymphocytes Relative: 79 %
Lymphs Abs: 9.5 10*3/uL — ABNORMAL HIGH (ref 0.7–4.0)
MCH: 30.1 pg (ref 26.0–34.0)
MCHC: 32.3 g/dL (ref 30.0–36.0)
MCV: 93.1 fL (ref 80.0–100.0)
Monocytes Absolute: 0.5 10*3/uL (ref 0.1–1.0)
Monocytes Relative: 4 %
Neutro Abs: 1.9 10*3/uL (ref 1.7–7.7)
Neutrophils Relative %: 16 %
Platelet Count: 52 10*3/uL — ABNORMAL LOW (ref 150–400)
RBC: 3.46 MIL/uL — ABNORMAL LOW (ref 3.87–5.11)
RDW: 14.5 % (ref 11.5–15.5)
WBC Count: 12.1 10*3/uL — ABNORMAL HIGH (ref 4.0–10.5)
nRBC: 0 % (ref 0.0–0.2)

## 2021-08-27 LAB — CMP (CANCER CENTER ONLY)
ALT: 11 U/L (ref 0–44)
AST: 21 U/L (ref 15–41)
Albumin: 3.7 g/dL (ref 3.5–5.0)
Alkaline Phosphatase: 59 U/L (ref 38–126)
Anion gap: 6 (ref 5–15)
BUN: 16 mg/dL (ref 8–23)
CO2: 25 mmol/L (ref 22–32)
Calcium: 9.1 mg/dL (ref 8.9–10.3)
Chloride: 108 mmol/L (ref 98–111)
Creatinine: 0.89 mg/dL (ref 0.44–1.00)
GFR, Estimated: >60 mL/min (ref >60)
Glucose, Bld: 96 mg/dL (ref 70–99)
Potassium: 4.1 mmol/L (ref 3.5–5.1)
Sodium: 139 mmol/L (ref 135–145)
Total Bilirubin: 0.4 mg/dL (ref 0.3–1.2)
Total Protein: 7.5 g/dL (ref 6.5–8.1)

## 2021-08-27 LAB — LACTATE DEHYDROGENASE: LDH: 209 U/L — ABNORMAL HIGH (ref 98–192)

## 2021-08-27 NOTE — Progress Notes (Signed)
HEMATOLOGY/ONCOLOGY CLINIC NOTE  Date of Service: 08/27/2021   Patient Care Team: [name], MD as PCP - General (Internal Medicine)  CHIEF COMPLAINTS/PURPOSE OF CONSULTATION:  Follow-up for continued evaluation and management of CLL  Oncologic History:  Oncology History  Breast cancer of upper-outer quadrant of left female breast ([place])  09/16/2010 Surgery   Left breast 1.5 cm invasive ductal carcinoma with calcifications, grade 1, perineural invasion was seen, DCIS, margins negative, 2 sentinel nodes negative, ER 99%, PR negative, Ki-67 79%, HER-2 negative, Oncotype 24, 16% ROR   10/25/2010 -  Anti-estrogen oral therapy   Arimidex 1 mg daily developed side effects and was held briefly and restarted   Lymphoma, small lymphocytic ([place])  11/16/2014 Initial Diagnosis   Lymphoma, small lymphocytic: Based on cervical lymph node biopsy CD5 positive CD10 negative   CLL (chronic lymphocytic leukemia) ([place])  12/12/2020 Cancer Staging   Staging form: Chronic Lymphocytic Leukemia / Small Lymphocytic Lymphoma, AJCC 8th Edition - Clinical stage from 12/12/2020: Modified Rai Stage IV (Modified Rai risk: High, Lymphocytosis: Present, Adenopathy: Present, Organomegaly: Present, Anemia: Present, Thrombocytopenia: Present) - Signed by [name], MD on 12/15/2020 Stage prefix: Post-therapy Response to neoadjuvant therapy: Unknown   12/15/2020 Initial Diagnosis   CLL (chronic lymphocytic leukemia) ([place])     HISTORY OF PRESENTING ILLNESS:  Please see previous note for details of initial presentation   INTERVAL HISTORY:  Brianna York is a 87 y.o. female is here for continued evaluation and management of her CLL. She is accompanied by her brother for this clinic visit. She reports She is doing well with no new symptoms or concerns.  She was reminded to maintain adequate hydration by drinking 48-64 oz of water.   She reports that she is eating well.  She notes that she is  tolerating the Acalabrutinib 100 mg p.o. daily with no prohibitive toxicities.   We discussed that since her platelet counts are continuing to drop we will start Promacta or if she prefers then she can go back on Nplate.She and her brother prefer to pursue Promacta since she lives at a distance.  Swelling around her eyes has improved greatly.  She reports increased mild hair loss and thinning.Marland Kitchen  Her skin erosions have improved and she notes that she is not scratching as much and has been compliant with suggested treatments.  No abdominal pain or distension. No other new bleeding issues. No other new or acute focal symptoms.  Still continues to have significant cognitive limitation due to likely dementia. Labs done today reviewed with the patient in detail.  MEDICAL HISTORY:  Past Medical History:  Diagnosis Date   Adenomatous colon polyp    tubular   Arthritis    osteo arthritis   Breast cancer ([place]) 02/16/2011   Diverticulosis    GERD (gastroesophageal reflux disease)    Hyperlipidemia    Leukemia ([place])    Lymphoma ([place])    small lympocytic   Osteopenia     SURGICAL HISTORY: Past Surgical History:  Procedure Laterality Date   bartholian cyst  1986   BREAST LUMPECTOMY Left 09-16-2010   CATARACT EXTRACTION, [place] ARTHROSCOPY Right 2010   LYMPH GLAND EXCISION Right 11/16/2014   Procedure: EXCISION AND BIOPSY OF RIGHT CERVICAL LYMPH NODE;  Surgeon: Jackolyn Confer, MD;  Location: [facility];  Service: General;  Laterality: Right;   TONSILLECTOMY  1940    SOCIAL HISTORY: Social  History   Socioeconomic History   Marital status: Widowed    Spouse name: Not on file   Number of children: 5   Years of education: Not on file   Highest education level: Not on file  Occupational History   Occupation: retired  Tobacco Use   Smoking status: Former    Types: Cigarettes    Quit date: 1989    Years since  quitting: 34.4   Smokeless tobacco: Never   Tobacco comments:    Quit smoking 27 years ago  Scientific laboratory technician Use: Never used  Substance and Sexual Activity   Alcohol use: Yes    Comment: occasional glass wine   Drug use: No   Sexual activity: Not Currently  Other Topics Concern   Not on file  Social History Narrative   Not on file   Social Determinants of Health   Financial Resource Strain: Not on file  Food Insecurity: Not on file  Transportation Needs: Not on file  Physical Activity: Not on file  Stress: Not on file  Social Connections: Not on file  Intimate Partner Violence: Not on file    FAMILY HISTORY: Family History  Problem Relation Age of Onset   Colon cancer Mother 33   Colon cancer Maternal Aunt        had colon sugery   Leukemia Father    Diabetes Brother    Diabetes Brother     ALLERGIES:  is allergic to ciprofloxacin, loratadine-pseudoephedrine er, penicillins, and tolterodine tartrate.  MEDICATIONS:  Current Outpatient Medications  Medication Sig Dispense Refill   Acalabrutinib Maleate (CALQUENCE) 100 MG TABS Take 100 mg by mouth daily. 30 tablet 1   alendronate (FOSAMAX) 70 MG tablet Take 70 mg by mouth once a week. Take with a full glass of water on an empty stomach.     atorvastatin (LIPITOR) 10 MG tablet Take 10 mg by mouth daily.     cetirizine (ZYRTEC) 10 MG tablet TAKE ONE TABLET AT BEDTIME 30 tablet 0   Cyanocobalamin (B-12) 1000 MCG SUBL Place 1,000 mcg under the tongue daily. 30 tablet 11   valACYclovir (VALTREX) 500 MG tablet TAKE ONE TABLET BY MOUTH TWICE DAILY FOR 7 DAYS 14 tablet 0   Vitamin D, Ergocalciferol, (DRISDOL) 50000 UNITS CAPS capsule Take 50,000 Units by mouth every 7 (seven) days.     No current facility-administered medications for this visit.    REVIEW OF SYSTEMS:   10 Point review of Systems was done is negative except as noted above.  PHYSICAL EXAMINATION: ECOG PERFORMANCE STATUS: 2  . Vitals:   08/27/21  1304  BP: (!) 118/59  Pulse: 75  Resp: 18  Temp: 97.9 F (36.6 C)  SpO2: 97%    Filed Weights   08/27/21 1304  Weight: 117 lb 1.6 oz (53.1 kg)    .Body mass index is 26.25 kg/m.  NAD GENERAL:alert, in no acute distress and comfortable SKIN: no acute rashes, no significant lesions EYES: conjunctiva are pink and non-injected, sclera anicteric NECK: supple, no JVD LYMPH:  no palpable lymphadenopathy in the cervical, axillary or inguinal regions LUNGS: clear to auscultation b/l with normal respiratory effort HEART: regular rate & rhythm ABDOMEN:  normoactive bowel sounds , non tender, not distended. Extremity: no pedal edema PSYCH: alert & oriented x 3 with fluent speech NEURO: no focal motor/sensory deficits  Exam performed in chair.  LABORATORY DATA:  I have reviewed the data as listed  .    Latest Ref  Rng & Units 08/27/2021   12:56 PM 08/13/2021    2:15 PM 08/07/2021    1:23 PM  CBC  WBC 4.0 - 10.5 K/uL 12.1  22.2  20.1   Hemoglobin 12.0 - 15.0 g/dL 10.4  10.3  10.0   Hematocrit 36.0 - 46.0 % 32.2  32.6  31.2   Platelets 150 - 400 K/uL 52  63  83     .    Latest Ref Rng & Units 08/13/2021    2:15 PM 08/07/2021    1:23 PM 07/30/2021    2:13 PM  CMP  Glucose 70 - 99 mg/dL 85  105  86   BUN 8 - 23 mg/dL 18  15  18    Creatinine 0.44 - 1.00 mg/dL 0.81  0.87  0.81   Sodium 135 - 145 mmol/L 139  139  139   Potassium 3.5 - 5.1 mmol/L 4.3  4.5  4.3   Chloride 98 - 111 mmol/L 109  107  105   CO2 22 - 32 mmol/L 24  27  27    Calcium 8.9 - 10.3 mg/dL 8.9  8.9  8.8   Total Protein 6.5 - 8.1 g/dL 7.7  7.2  7.7   Total Bilirubin 0.3 - 1.2 mg/dL 0.3  0.4  0.3   Alkaline Phos 38 - 126 U/L 57  61  54   AST 15 - 41 U/L 23  18  19    ALT 0 - 44 U/L 15  10  13     Lab Results  Component Value Date   LDH 156 08/13/2021     Surgical Pathology  CASE: [id number]  PATIENT: Brianna York  Flow Pathology Report      Clinical history: right orbital mass   DIAGNOSIS:    - Monoclonal B-cell population identified, see comment.   COMMENT:   There is a monoclonal population of B-cells with expression of CD5 and  CD200. The phenotype is consistent with the patient's history of small  lymphocytic lymphoma.   02/21/18 Left Lacrimal sac biopsy:    12/18/14 Cytogenetics:    12/04/14 Right cervical lymph node biopsy:   06/27/18 FISH CLL Prognostic Panel:    RADIOGRAPHIC STUDIES: I have personally reviewed the radiological images as listed and agreed with the findings in the report. No results found.  ASSESSMENT & PLAN:   87 y.o. female with  1. Chronic Lymphocytic Leukemia 11/16/14 Right cervical lymph node biopsy revealed SLL/CLL 12/18/14 Cytogenetics revealed 59% of cells with gain of CEP12 and 5% of cells with loss of ATM 02/21/18 Right lacrimal gland revealed lymphoid infiltrates 06/27/18 FISH CLL Prognostic Panel revealed 49.33% of cells with Trisomy 12, and only 2% of cells with 17p mutation  08/03/18 PET/CT revealed "Exam positive for FDG avid adenopathy within the neck, chest, abdomen and pelvis compatible with the clinical history of chronic lymphocytic leukemia. 2. Splenomegaly (about 13cm) with mild increased radiotracer uptake throughout the spleen (above blood pool activity)".   2. History of Invasive Ductal Carcinoma 09/16/10 Left breast 1.5cm IDC with calcifications, grade 1, perineural invasion. DCIS margins negative, 2 sentinel nodes negative, ER 99%, PR negative, Ki-67 at 79%, HER-2 negative, Oncotype 24, 16% ROR Continues on 19m Anastrozole daily, began on 10/25/10. Last available Mammogram on 12/31/16 negative  3.  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  Recent right orbital biopsy also showed CLL/SLL.  Has previously had radiation treatment with temporary improvement.  4.  Thrombocytopenia related to CLL, splenomegaly and possible element  of ITP  PLAN:  -Lab results reviewed with the patient in detail.  WBC count 12.1k, platelets 52k, hemoglobin 10.4. LDH at 209. -Continue B complex 1 capsule p.o. 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 -Continue Acalabrutinib 100 mg p.o. daily to try to reduce amount of bleeding/bruising. Once platelets improved with Promacta will consider dose increase to 1070mpo BID. -Clinically the patient's cervical and axillary lymphadenopathy has significantly improved. -Pt advised to use Calamine spray for itching. -recommend she f/u with her dermatologist as well. -Pt advised to continue to keep nails short to avoid further damage from scratching. -She was reminded to maintain adequate hydration by drinking 48-64 oz of water.   5. Likely Dementia - patient with significant cognitive deficits. Brother has been actively caring for her. She notes she is comfortable with her current living arrangement and does not want to move closer to her kids. Plan -recommended she f/u with PCP for evaluation and management of likely dementia.  FOLLOW UP: Labs in 2 weeks Labs and Visit with IrMurray Hodgkinsn 4 weeks RTC with Dr KaIrene Limbon 8 weeks with labs   The total time spent in the appointment was 30 minutes*.  All of the patient's questions were answered with apparent satisfaction. The patient knows to call the clinic with any problems, questions or concerns.   GaSullivan LoneD MS AAHIVMS SCWilmington Surgery Center LPTPhysicians' Medical Center LLCematology/Oncology Physician CoAnderson [facility] South.*Total Encounter Time as defined by the Centers for Medicare and Medicaid Services includes, in addition to the face-to-face time of a patient visit (documented in the note above) non-face-to-face time: obtaining and reviewing outside history, ordering and reviewing medications, tests or procedures, care coordination (communications with other health care professionals or caregivers) and documentation in  the medical record.  I, GrMelene Mulleram acting as scribe for Dr. GaSullivan LoneMD.  .I have reviewed the above documentation for accuracy and completeness, and I agree with the above. .GBrunetta GeneraD

## 2021-08-31 ENCOUNTER — Encounter: Payer: Self-pay | Admitting: Hematology

## 2021-08-31 MED ORDER — ELTROMBOPAG OLAMINE 25 MG PO TABS
25.0000 mg | ORAL_TABLET | Freq: Every day | ORAL | 2 refills | Status: DC
  Filled 2021-08-31: qty 30, 30d supply, fill #0

## 2021-09-01 ENCOUNTER — Telehealth: Payer: Self-pay | Admitting: Pharmacist

## 2021-09-01 ENCOUNTER — Telehealth: Payer: Self-pay | Admitting: Pharmacy Technician

## 2021-09-01 ENCOUNTER — Other Ambulatory Visit (HOSPITAL_COMMUNITY): Payer: Self-pay

## 2021-09-01 DIAGNOSIS — D693 Immune thrombocytopenic purpura: Secondary | ICD-10-CM

## 2021-09-01 NOTE — Telephone Encounter (Signed)
Oral Oncology Patient Advocate Encounter   An urgent appeal for the prior authorization denial of Promacta has been started by the pharmacist on 09/01/2021.   This encounter will continue to be updated until final appeal determination.       Lady Deutscher, CPhT-Adv Pharmacy Patient Advocate Specialist [place] Patient Advocate Team Direct Number: [phone]  Fax: [phone]

## 2021-09-01 NOTE — Telephone Encounter (Signed)
Oral Oncology Pharmacist Encounter  Received new prescription for Promacta (eltrombopag) for the treatment of chronic ITP, planned duration until stabilization of platelet counts.  CBC w/ Diff and CMP from 08/27/21 assessed, pt with pltc of 52 K/uL, last dose of Nplate (50 mcg) on 06/27/21. Prescription dose and frequency assessed for appropriateness.   Current medication list in Epic reviewed, DDIs with Promacta identified: Drug-drug interaction between Promacta and Atorvastatin - Promacta may increase drug exposure to atorvastatin due to it being an OATP1B1 substrate. Patient on low dose atorvastatin, LFTs stable. No changes in therapy warranted at this time.  Evaluated chart and no patient barriers to medication adherence noted.   Prescription has been e-scribed to the [facility] for benefits analysis and approval.  Oral Oncology Clinic will continue to follow for insurance authorization, copayment issues, initial counseling and start date.  Leron Croak, PharmD, BCPS Hematology/Oncology Clinical Pharmacist [name] [place] [phone] 09/01/2021 9:00 AM

## 2021-09-01 NOTE — Telephone Encounter (Signed)
Oral Oncology Patient Advocate Encounter   Received notification that prior authorization for Promacta is required.   PA submitted on 09/01/2021 Key [id number] Status is pending     Lady Deutscher, CPhT-Adv Pharmacy Patient Advocate Specialist [place] Patient Advocate Team Direct Number: [phone]  Fax: [phone]

## 2021-09-02 NOTE — Telephone Encounter (Signed)
Oral Oncology Pharmacist Encounter   Prior Authorization for Brianna York has been denied due to patient's platelet count being > 50,000.   Appeal letter sent to [employer] with supporting documentation. Expedited appeal submitted over CoverMyMeds.    Oral Oncology Clinic will continue to follow.    Leron Croak, PharmD, BCPS Hematology/Oncology Clinical Pharmacist [facility] [phone] 09/02/2021 9:46 AM

## 2021-09-04 ENCOUNTER — Other Ambulatory Visit (HOSPITAL_COMMUNITY): Payer: Self-pay

## 2021-09-04 ENCOUNTER — Telehealth: Payer: Self-pay | Admitting: Pharmacy Technician

## 2021-09-04 NOTE — Telephone Encounter (Signed)
Oral Oncology Pharmacist Encounter   Appeal for Brianna York has been upheld   Will start with manufacturer assistance process at this time.    Oral Oncology Clinic will continue to follow.    Leron Croak, PharmD, BCPS Hematology/Oncology Clinical Pharmacist [facility] [phone] 09/04/2021 1:17 PM

## 2021-09-08 ENCOUNTER — Other Ambulatory Visit (HOSPITAL_COMMUNITY): Payer: Self-pay

## 2021-09-09 ENCOUNTER — Other Ambulatory Visit (HOSPITAL_COMMUNITY): Payer: Self-pay

## 2021-09-10 ENCOUNTER — Other Ambulatory Visit: Payer: Self-pay

## 2021-09-10 DIAGNOSIS — C9110 Chronic lymphocytic leukemia of B-cell type not having achieved remission: Secondary | ICD-10-CM

## 2021-09-11 ENCOUNTER — Other Ambulatory Visit: Payer: Self-pay

## 2021-09-11 ENCOUNTER — Inpatient Hospital Stay: Payer: Medicare HMO

## 2021-09-11 DIAGNOSIS — C9110 Chronic lymphocytic leukemia of B-cell type not having achieved remission: Secondary | ICD-10-CM

## 2021-09-11 DIAGNOSIS — D693 Immune thrombocytopenic purpura: Secondary | ICD-10-CM | POA: Diagnosis not present

## 2021-09-11 LAB — CMP (CANCER CENTER ONLY)
ALT: 11 U/L (ref 0–44)
AST: 21 U/L (ref 15–41)
Albumin: 3.8 g/dL (ref 3.5–5.0)
Alkaline Phosphatase: 60 U/L (ref 38–126)
Anion gap: 5 (ref 5–15)
BUN: 16 mg/dL (ref 8–23)
CO2: 27 mmol/L (ref 22–32)
Calcium: 9.3 mg/dL (ref 8.9–10.3)
Chloride: 107 mmol/L (ref 98–111)
Creatinine: 0.92 mg/dL (ref 0.44–1.00)
GFR, Estimated: >60 mL/min (ref >60)
Glucose, Bld: 78 mg/dL (ref 70–99)
Potassium: 4.4 mmol/L (ref 3.5–5.1)
Sodium: 139 mmol/L (ref 135–145)
Total Bilirubin: 0.4 mg/dL (ref 0.3–1.2)
Total Protein: 7.9 g/dL (ref 6.5–8.1)

## 2021-09-11 LAB — CBC WITH DIFFERENTIAL (CANCER CENTER ONLY)
Abs Immature Granulocytes: 0.02 10*3/uL (ref 0.00–0.07)
Basophils Absolute: 0 10*3/uL (ref 0.0–0.1)
Basophils Relative: 0 %
Eosinophils Absolute: 0.2 10*3/uL (ref 0.0–0.5)
Eosinophils Relative: 1 %
HCT: 34.6 % — ABNORMAL LOW (ref 36.0–46.0)
Hemoglobin: 10.9 g/dL — ABNORMAL LOW (ref 12.0–15.0)
Immature Granulocytes: 0 %
Lymphocytes Relative: 86 %
Lymphs Abs: 17.2 10*3/uL — ABNORMAL HIGH (ref 0.7–4.0)
MCH: 29.1 pg (ref 26.0–34.0)
MCHC: 31.5 g/dL (ref 30.0–36.0)
MCV: 92.3 fL (ref 80.0–100.0)
Monocytes Absolute: 0.6 10*3/uL (ref 0.1–1.0)
Monocytes Relative: 3 %
Neutro Abs: 2 10*3/uL (ref 1.7–7.7)
Neutrophils Relative %: 10 %
Platelet Count: 78 10*3/uL — ABNORMAL LOW (ref 150–400)
RBC: 3.75 MIL/uL — ABNORMAL LOW (ref 3.87–5.11)
RDW: 14.6 % (ref 11.5–15.5)
WBC Count: 20 10*3/uL — ABNORMAL HIGH (ref 4.0–10.5)
nRBC: 0 % (ref 0.0–0.2)

## 2021-09-11 LAB — LACTATE DEHYDROGENASE: LDH: 183 U/L (ref 98–192)

## 2021-09-12 ENCOUNTER — Telehealth: Payer: Self-pay | Admitting: Hematology

## 2021-09-12 NOTE — Telephone Encounter (Signed)
Called pt to confirm appts via 6/30 inbasket, pt and support person aware

## 2021-09-15 NOTE — Telephone Encounter (Signed)
Oral Oncology Patient Advocate Encounter   Received status update for application for assistance for Promacta through [employer] [place].   Application is pending benefits verification from [facility]. Once [facility] completes their process the application will move to the next step.    [employer] phone number [phone].   I will continue to check the status until final determination.   Lady Deutscher, CPhT-Adv Pharmacy Patient Advocate Specialist [place] Patient Advocate Team Direct Number: [phone]  Fax: [phone]

## 2021-09-17 NOTE — Telephone Encounter (Signed)
Oral Oncology Patient Advocate Encounter   Called [employer] regarding status update for application for assistance for Promacta through [place].   Application has been sent to [employer] for final determination. They will contact patient. Estimated turn-around time is 1-2 business days.   [employer]PA[employer] phone number [phone].   I will continue to check the status until final determination.   Lady Deutscher, CPhT-Adv Pharmacy Patient Advocate Specialist [place] Patient Advocate Team Direct Number: [phone]  Fax: [phone]

## 2021-09-18 NOTE — Telephone Encounter (Signed)
Oral Oncology Patient Advocate Encounter   Received notification that the application for assistance for Promacta through [employer] has been approved.   [employer] phone number [phone].   Effective dates: 09/17/2021 through 03/15/2022  I have spoken to the patient.   Lady Deutscher, CPhT-Adv Pharmacy Patient Advocate Specialist [place] Patient Advocate Team Direct Number: [phone]  Fax: [phone]

## 2021-09-23 ENCOUNTER — Inpatient Hospital Stay (HOSPITAL_BASED_OUTPATIENT_CLINIC_OR_DEPARTMENT_OTHER): Payer: Medicare HMO | Admitting: Physician Assistant

## 2021-09-23 ENCOUNTER — Inpatient Hospital Stay: Payer: Medicare HMO | Attending: Hematology

## 2021-09-23 ENCOUNTER — Other Ambulatory Visit: Payer: Self-pay

## 2021-09-23 VITALS — BP 150/58 | HR 71 | Temp 97.5°F | Resp 16 | Wt 119.3 lb

## 2021-09-23 DIAGNOSIS — Z853 Personal history of malignant neoplasm of breast: Secondary | ICD-10-CM | POA: Diagnosis not present

## 2021-09-23 DIAGNOSIS — Z79899 Other long term (current) drug therapy: Secondary | ICD-10-CM | POA: Diagnosis not present

## 2021-09-23 DIAGNOSIS — C9110 Chronic lymphocytic leukemia of B-cell type not having achieved remission: Secondary | ICD-10-CM | POA: Diagnosis not present

## 2021-09-23 DIAGNOSIS — D693 Immune thrombocytopenic purpura: Secondary | ICD-10-CM | POA: Diagnosis not present

## 2021-09-23 LAB — CBC WITH DIFFERENTIAL (CANCER CENTER ONLY)
Abs Immature Granulocytes: 0.02 10*3/uL (ref 0.00–0.07)
Basophils Absolute: 0 10*3/uL (ref 0.0–0.1)
Basophils Relative: 0 %
Eosinophils Absolute: 0.1 10*3/uL (ref 0.0–0.5)
Eosinophils Relative: 1 %
HCT: 33.8 % — ABNORMAL LOW (ref 36.0–46.0)
Hemoglobin: 10.4 g/dL — ABNORMAL LOW (ref 12.0–15.0)
Immature Granulocytes: 0 %
Lymphocytes Relative: 83 %
Lymphs Abs: 12.1 10*3/uL — ABNORMAL HIGH (ref 0.7–4.0)
MCH: 28.6 pg (ref 26.0–34.0)
MCHC: 30.8 g/dL (ref 30.0–36.0)
MCV: 92.9 fL (ref 80.0–100.0)
Monocytes Absolute: 0.4 10*3/uL (ref 0.1–1.0)
Monocytes Relative: 3 %
Neutro Abs: 1.8 10*3/uL (ref 1.7–7.7)
Neutrophils Relative %: 13 %
Platelet Count: 59 10*3/uL — ABNORMAL LOW (ref 150–400)
RBC: 3.64 MIL/uL — ABNORMAL LOW (ref 3.87–5.11)
RDW: 15 % (ref 11.5–15.5)
WBC Count: 14.6 10*3/uL — ABNORMAL HIGH (ref 4.0–10.5)
nRBC: 0 % (ref 0.0–0.2)

## 2021-09-23 LAB — CMP (CANCER CENTER ONLY)
ALT: 10 U/L (ref 0–44)
AST: 20 U/L (ref 15–41)
Albumin: 3.8 g/dL (ref 3.5–5.0)
Alkaline Phosphatase: 53 U/L (ref 38–126)
Anion gap: 5 (ref 5–15)
BUN: 15 mg/dL (ref 8–23)
CO2: 27 mmol/L (ref 22–32)
Calcium: 9.2 mg/dL (ref 8.9–10.3)
Chloride: 107 mmol/L (ref 98–111)
Creatinine: 0.94 mg/dL (ref 0.44–1.00)
GFR, Estimated: 59 mL/min — ABNORMAL LOW (ref >60)
Glucose, Bld: 92 mg/dL (ref 70–99)
Potassium: 4.4 mmol/L (ref 3.5–5.1)
Sodium: 139 mmol/L (ref 135–145)
Total Bilirubin: 0.5 mg/dL (ref 0.3–1.2)
Total Protein: 7.7 g/dL (ref 6.5–8.1)

## 2021-09-23 LAB — LACTATE DEHYDROGENASE: LDH: 173 U/L (ref 98–192)

## 2021-09-23 NOTE — Progress Notes (Signed)
HEMATOLOGY/ONCOLOGY CLINIC NOTE  Date of Service: 09/23/2021  Patient Care Team: [name], MD as PCP - General (Internal Medicine)  CHIEF COMPLAINTS/PURPOSE OF CONSULTATION:  Follow-up for continued evaluation and management of CLL  Oncologic History:  Oncology History  Breast cancer of upper-outer quadrant of left female breast ([place])  09/16/2010 Surgery   Left breast 1.5 cm invasive ductal carcinoma with calcifications, grade 1, perineural invasion was seen, DCIS, margins negative, 2 sentinel nodes negative, ER 99%, PR negative, Ki-67 79%, HER-2 negative, Oncotype 24, 16% ROR   10/25/2010 -  Anti-estrogen oral therapy   Arimidex 1 mg daily developed side effects and was held briefly and restarted   Lymphoma, small lymphocytic ([place])  11/16/2014 Initial Diagnosis   Lymphoma, small lymphocytic: Based on cervical lymph node biopsy CD5 positive CD10 negative   CLL (chronic lymphocytic leukemia) ([place])  12/12/2020 Cancer Staging   Staging form: Chronic Lymphocytic Leukemia / Small Lymphocytic Lymphoma, AJCC 8th Edition - Clinical stage from 12/12/2020: Modified Rai Stage IV (Modified Rai risk: High, Lymphocytosis: Present, Adenopathy: Present, Organomegaly: Present, Anemia: Present, Thrombocytopenia: Present) - Signed by [name], MD on 12/15/2020 Stage prefix: Post-therapy Response to neoadjuvant therapy: Unknown   12/15/2020 Initial Diagnosis   CLL (chronic lymphocytic leukemia) ([place])     HISTORY OF PRESENTING ILLNESS:  Please see previous note for details of initial presentation  INTERVAL HISTORY:  Brianna York is a 87 y.o. female is here for continued evaluation and management of her CLL. She was last seen by Dr.  Limbo on 08/27/2021. She is accompanied by her brother for this clinic visit.  Brianna York reports that she is tolerating the Acalabrutinib 100 mg p.o. daily without any significant toxicities.  Her energy levels are fairly stable.  She has a good  appetite and denies any weight loss.  She denies nausea, vomiting or abdominal pain.  Her bowel habits are unchanged without any diarrhea or constipation.  She reports some bruising in her arms but denies any signs of active bleeding.  She has not started her Promacta prescription or waiting delivery from the pharmacy.  She denies fevers, chills, night sweats, shortness of breath, chest pain or cough.  She has no other complaints rest of the 10 point ROS is negative.  MEDICAL HISTORY:  Past Medical History:  Diagnosis Date   Adenomatous colon polyp    tubular   Arthritis    osteo arthritis   Breast cancer ([place]) 02/16/2011   Diverticulosis    GERD (gastroesophageal reflux disease)    Hyperlipidemia    Leukemia ([place])    Lymphoma ([place])    small lympocytic   Osteopenia     SURGICAL HISTORY: Past Surgical History:  Procedure Laterality Date   bartholian cyst  1986   BREAST LUMPECTOMY Left 09-16-2010   CATARACT EXTRACTION, [place] ARTHROSCOPY Right 2010   LYMPH GLAND EXCISION Right 11/16/2014   Procedure: EXCISION AND BIOPSY OF RIGHT CERVICAL LYMPH NODE;  Surgeon: Jackolyn Confer, MD;  Location: [facility];  Service: General;  Laterality: Right;   TONSILLECTOMY  1940    SOCIAL HISTORY: Social History   Socioeconomic History   Marital status: Widowed    Spouse name: Not on file   Number of children: 5   Years of education: Not on file   Highest education level: Not on file  Occupational History   Occupation: retired  Tobacco Use   Smoking  status: Former    Types: Cigarettes    Quit date: 1989    Years since quitting: 34.5   Smokeless tobacco: Never   Tobacco comments:    Quit smoking 27 years ago  Vaping Use   Vaping Use: Never used  Substance and Sexual Activity   Alcohol use: Yes    Comment: occasional glass wine   Drug use: No   Sexual activity: Not Currently  Other Topics Concern   Not on file  Social  History Narrative   Not on file   Social Determinants of Health   Financial Resource Strain: Not on file  Food Insecurity: Not on file  Transportation Needs: Not on file  Physical Activity: Not on file  Stress: Not on file  Social Connections: Not on file  Intimate Partner Violence: Not on file    FAMILY HISTORY: Family History  Problem Relation Age of Onset   Colon cancer Mother 40   Colon cancer Maternal Aunt        had colon sugery   Leukemia Father    Diabetes Brother    Diabetes Brother     ALLERGIES:  is allergic to ciprofloxacin, loratadine-pseudoephedrine er, penicillins, and tolterodine tartrate.  MEDICATIONS:  Current Outpatient Medications  Medication Sig Dispense Refill   Acalabrutinib Maleate (CALQUENCE) 100 MG TABS Take 100 mg by mouth daily. 30 tablet 1   alendronate (FOSAMAX) 70 MG tablet Take 70 mg by mouth once a week. Take with a full glass of water on an empty stomach.     atorvastatin (LIPITOR) 10 MG tablet Take 10 mg by mouth daily.     cetirizine (ZYRTEC) 10 MG tablet TAKE ONE TABLET AT BEDTIME 30 tablet 0   Cyanocobalamin (B-12) 1000 MCG SUBL Place 1,000 mcg under the tongue daily. 30 tablet 11   eltrombopag (PROMACTA) 25 MG tablet Take 1 tablet (25 mg total) by mouth daily. Take on an empty stomach, 1 hour before a meal or 2 hours after. 30 tablet 2   valACYclovir (VALTREX) 500 MG tablet TAKE ONE TABLET BY MOUTH TWICE DAILY FOR 7 DAYS 14 tablet 0   Vitamin D, Ergocalciferol, (DRISDOL) 50000 UNITS CAPS capsule Take 50,000 Units by mouth every 7 (seven) days.     No current facility-administered medications for this visit.    REVIEW OF SYSTEMS:   10 Point review of Systems was done is negative except as noted above.  PHYSICAL EXAMINATION: ECOG PERFORMANCE STATUS: 2  . Vitals:   09/23/21 1059  BP: (!) 150/58  Pulse: 71  Resp: 16  Temp: (!) 97.5 F (36.4 C)  SpO2: 100%    Filed Weights   09/23/21 1059  Weight: 119 lb 4.8 oz (54.1 kg)     .Body mass index is 26.75 kg/m.  GENERAL:alert, in no acute distress and comfortable SKIN: no acute rashes, no significant lesions. Bruising in bilateral arms.  EYES: conjunctiva are pink and non-injected, sclera anicteric NECK: supple, no JVD LYMPH:  no palpable lymphadenopathy in the cervical or inguinal regions LUNGS: clear to auscultation b/l with normal respiratory effort HEART: regular rate & rhythm ABDOMEN:  normoactive bowel sounds , non tender, not distended. Extremity: trace edema in left ankle.  PSYCH: alert & oriented x 3 with fluent speech NEURO: no focal motor/sensory deficits  Exam performed in chair.  LABORATORY DATA:  I have reviewed the data as listed  .    Latest Ref Rng & Units 09/11/2021    1:21 PM 08/27/2021   12:56  PM 08/13/2021    2:15 PM  CBC  WBC 4.0 - 10.5 K/uL 20.0  12.1  22.2   Hemoglobin 12.0 - 15.0 g/dL 10.9  10.4  10.3   Hematocrit 36.0 - 46.0 % 34.6  32.2  32.6   Platelets 150 - 400 K/uL 78  52  63     .    Latest Ref Rng & Units 09/11/2021    1:21 PM 08/27/2021   12:56 PM 08/13/2021    2:15 PM  CMP  Glucose 70 - 99 mg/dL 78  96  85   BUN 8 - 23 mg/dL 16  16  18    Creatinine 0.44 - 1.00 mg/dL 0.92  0.89  0.81   Sodium 135 - 145 mmol/L 139  139  139   Potassium 3.5 - 5.1 mmol/L 4.4  4.1  4.3   Chloride 98 - 111 mmol/L 107  108  109   CO2 22 - 32 mmol/L 27  25  24    Calcium 8.9 - 10.3 mg/dL 9.3  9.1  8.9   Total Protein 6.5 - 8.1 g/dL 7.9  7.5  7.7   Total Bilirubin 0.3 - 1.2 mg/dL 0.4  0.4  0.3   Alkaline Phos 38 - 126 U/L 60  59  57   AST 15 - 41 U/L 21  21  23    ALT 0 - 44 U/L 11  11  15     Lab Results  Component Value Date   LDH 183 09/11/2021     Surgical Pathology  CASE: [id number]  PATIENT: Deandria Vicens  Flow Pathology Report   Clinical history: right orbital mass   DIAGNOSIS:   - Monoclonal B-cell population identified, see comment.   COMMENT:   There is a monoclonal population of B-cells with  expression of CD5 and  CD200. The phenotype is consistent with the patient's history of small  lymphocytic lymphoma.   02/21/18 Left Lacrimal sac biopsy:    12/18/14 Cytogenetics:    12/04/14 Right cervical lymph node biopsy:   06/27/18 FISH CLL Prognostic Panel:    RADIOGRAPHIC STUDIES: I have personally reviewed the radiological images as listed and agreed with the findings in the report. No results found.  ASSESSMENT & PLAN:   87 y.o. female with  1. Chronic Lymphocytic Leukemia 11/16/14 Right cervical lymph node biopsy revealed SLL/CLL 12/18/14 Cytogenetics revealed 59% of cells with gain of CEP12 and 5% of cells with loss of ATM 02/21/18 Right lacrimal gland revealed lymphoid infiltrates 06/27/18 FISH CLL Prognostic Panel revealed 49.33% of cells with Trisomy 12, and only 2% of cells with 17p mutation  08/03/18 PET/CT revealed "Exam positive for FDG avid adenopathy within the neck, chest, abdomen and pelvis compatible with the clinical history of chronic lymphocytic leukemia. 2. Splenomegaly (about 13cm) with mild increased radiotracer uptake throughout the spleen (above blood pool activity)".   2. History of Invasive Ductal Carcinoma 09/16/10 Left breast 1.5cm IDC with calcifications, grade 1, perineural invasion. DCIS margins negative, 2 sentinel nodes negative, ER 99%, PR negative, Ki-67 at 79%, HER-2 negative, Oncotype 24, 16% ROR Continues on 13m Anastrozole daily, began on 10/25/10. Last available Mammogram on 12/31/16 negative  3.  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   -Right orbital biopsy from 11/23/2019 showed CLL/SLL.  -Received radiation treatment with temporary improvement.  4.  Leukocytosis/Anemia/Thrombocytopenia: -Secondary to CLL -Evidence of splenomegaly and possible element of ITP  5. Likely Dementia -Patient has significant cognitive deficits. Brother has been actively caring  for her. She notes she is comfortable with her  current living arrangement and does not want to move closer to her kids.  PLAN:  -Lab results reviewed with the patient in detail. WBC count 14.6, platelets 59K, hemoglobin 10.4. LDH at 173. Overall stable.  -Continue B complex 1 capsule p.o. daily -Awaiting to start Promacta @ 16m po daily for management of chronic ITP related to CLL. Received approval for patient assistance program.  -Continue Acalabrutinib 100 mg p.o. daily. If platelet count improves, we can increase to dose to 1097mpo BID.  FOLLOW UP: -Labs and f/u visit with Dr. KaIrene Limbon 4 weeks -Labs and f/u visit with Dr. KaIrene Limbon 8 weeks  All of the patient's questions were answered with apparent satisfaction. The patient knows to call the clinic with any problems, questions or concerns.  I have spent a total of 30 minutes minutes of face-to-face and non-face-to-face time, preparing to see the patient,  performing a medically appropriate examination, counseling and educating the patient, documenting clinical information in the electronic health record,  and care coordination.   IrDede QueryA-C Dept of Hematology and OnMcCrackent We[facility] Youngstownhone: [phone]

## 2021-09-24 MED ORDER — ELTROMBOPAG OLAMINE 25 MG PO TABS: 25.0000 mg | ORAL_TABLET | Freq: Every day | ORAL | 2 refills | Status: DC

## 2021-09-24 NOTE — Telephone Encounter (Signed)
Oral Chemotherapy Pharmacist Encounter  I spoke with patient's brother, Brianna York, for overview of: Promacta (eltrombopag) for the treatment of ITP secondary to CLL, planned duration until stabilization of platelet counts.  Counseled on administration, dosing, side effects, monitoring, drug-food interactions, safe handling, storage, and disposal.  Patient will take Promacta '25mg'$  tablets, 1 tablet by mouth once daily on an empty stomach, 1 hour before or 2 hours after a meal.  Patient's brother counseled to administer Promacta at least 2 hours before, or 4 hours after antacids, foods high in calcium, or vitamin supplements (iron, calcium, aluminum, magnesium, selinium, zinc).  Promacta start date: 09/27/21  Adverse effects include but are not limited to: fatigue, diarrhea, nausea, hepatotoxicity, myalgias, fever.    Reviewed importance of keeping a medication schedule and plan for any missed doses. No barriers to medication adherence identified.  Medication reconciliation performed and medication/allergy list updated.  Patient is obtaining Promacta through [employer] patient assistance program. Medication is being shipped through [employer] by [place] location. Planned to arrive to patient's home 09/26/21.  All questions answered.  Brianna York voiced understanding and appreciation.   Medication education handout placed in mail for patient and patient's family members. Patient's family knows to call the office with questions or concerns. Oral Chemotherapy Clinic phone number provided.  Leron Croak, PharmD, BCPS, [facility] Hematology/Oncology Clinical Pharmacist [name] and [place] [phone] 09/24/2021 2:38 PM

## 2021-10-22 ENCOUNTER — Inpatient Hospital Stay: Payer: Medicare HMO | Attending: Hematology | Admitting: Hematology

## 2021-10-22 ENCOUNTER — Inpatient Hospital Stay: Payer: Medicare HMO

## 2021-10-22 ENCOUNTER — Other Ambulatory Visit: Payer: Self-pay

## 2021-10-22 DIAGNOSIS — C9110 Chronic lymphocytic leukemia of B-cell type not having achieved remission: Secondary | ICD-10-CM | POA: Diagnosis present

## 2021-10-22 DIAGNOSIS — D693 Immune thrombocytopenic purpura: Secondary | ICD-10-CM | POA: Diagnosis present

## 2021-10-22 DIAGNOSIS — Z853 Personal history of malignant neoplasm of breast: Secondary | ICD-10-CM | POA: Diagnosis not present

## 2021-10-22 LAB — CBC WITH DIFFERENTIAL (CANCER CENTER ONLY)
Abs Immature Granulocytes: 0.01 10*3/uL (ref 0.00–0.07)
Basophils Absolute: 0 10*3/uL (ref 0.0–0.1)
Basophils Relative: 0 %
Eosinophils Absolute: 0.1 10*3/uL (ref 0.0–0.5)
Eosinophils Relative: 1 %
HCT: 33.3 % — ABNORMAL LOW (ref 36.0–46.0)
Hemoglobin: 10.7 g/dL — ABNORMAL LOW (ref 12.0–15.0)
Immature Granulocytes: 0 %
Lymphocytes Relative: 81 %
Lymphs Abs: 13.1 10*3/uL — ABNORMAL HIGH (ref 0.7–4.0)
MCH: 28.8 pg (ref 26.0–34.0)
MCHC: 32.1 g/dL (ref 30.0–36.0)
MCV: 89.5 fL (ref 80.0–100.0)
Monocytes Absolute: 0.4 10*3/uL (ref 0.1–1.0)
Monocytes Relative: 3 %
Neutro Abs: 2.4 10*3/uL (ref 1.7–7.7)
Neutrophils Relative %: 15 %
Platelet Count: 101 10*3/uL — ABNORMAL LOW (ref 150–400)
RBC: 3.72 MIL/uL — ABNORMAL LOW (ref 3.87–5.11)
RDW: 14.9 % (ref 11.5–15.5)
Smear Review: NORMAL
WBC Count: 16 10*3/uL — ABNORMAL HIGH (ref 4.0–10.5)
nRBC: 0 % (ref 0.0–0.2)

## 2021-10-22 LAB — CMP (CANCER CENTER ONLY)
ALT: 8 U/L (ref 0–44)
AST: 16 U/L (ref 15–41)
Albumin: 3.9 g/dL (ref 3.5–5.0)
Alkaline Phosphatase: 55 U/L (ref 38–126)
Anion gap: 6 (ref 5–15)
BUN: 15 mg/dL (ref 8–23)
CO2: 27 mmol/L (ref 22–32)
Calcium: 9.1 mg/dL (ref 8.9–10.3)
Chloride: 107 mmol/L (ref 98–111)
Creatinine: 0.9 mg/dL (ref 0.44–1.00)
GFR, Estimated: >60 mL/min (ref >60)
Glucose, Bld: 114 mg/dL — ABNORMAL HIGH (ref 70–99)
Potassium: 4 mmol/L (ref 3.5–5.1)
Sodium: 140 mmol/L (ref 135–145)
Total Bilirubin: 0.5 mg/dL (ref 0.3–1.2)
Total Protein: 7.8 g/dL (ref 6.5–8.1)

## 2021-10-22 LAB — LACTATE DEHYDROGENASE: LDH: 156 U/L (ref 98–192)

## 2021-10-27 ENCOUNTER — Other Ambulatory Visit: Payer: Self-pay

## 2021-10-27 DIAGNOSIS — C9110 Chronic lymphocytic leukemia of B-cell type not having achieved remission: Secondary | ICD-10-CM

## 2021-10-27 MED ORDER — CALQUENCE 100 MG PO TABS: 100.0000 mg | ORAL_TABLET | Freq: Every day | ORAL | 1 refills | Status: DC

## 2021-10-28 ENCOUNTER — Encounter: Payer: Self-pay | Admitting: Hematology

## 2021-10-28 NOTE — Progress Notes (Signed)
HEMATOLOGY/ONCOLOGY CLINIC NOTE  Date of Service:10/22/2021   Patient Care Team: [name], MD as PCP - General (Internal Medicine)  CHIEF COMPLAINTS/PURPOSE OF CONSULTATION:  Follow-up for continued evaluation and management of CLL  Oncologic History:  Oncology History  Breast cancer of upper-outer quadrant of left female breast ([place])  09/16/2010 Surgery   Left breast 1.5 cm invasive ductal carcinoma with calcifications, grade 1, perineural invasion was seen, DCIS, margins negative, 2 sentinel nodes negative, ER 99%, PR negative, Ki-67 79%, HER-2 negative, Oncotype 24, 16% ROR   10/25/2010 -  Anti-estrogen oral therapy   Arimidex 1 mg daily developed side effects and was held briefly and restarted   Lymphoma, small lymphocytic ([place])  11/16/2014 Initial Diagnosis   Lymphoma, small lymphocytic: Based on cervical lymph node biopsy CD5 positive CD10 negative   CLL (chronic lymphocytic leukemia) ([place])  12/12/2020 Cancer Staging   Staging form: Chronic Lymphocytic Leukemia / Small Lymphocytic Lymphoma, AJCC 8th Edition - Clinical stage from 12/12/2020: Modified Rai Stage IV (Modified Rai risk: High, Lymphocytosis: Present, Adenopathy: Present, Organomegaly: Present, Anemia: Present, Thrombocytopenia: Present) - Signed by [name], MD on 12/15/2020 Stage prefix: Post-therapy Response to neoadjuvant therapy: Unknown   12/15/2020 Initial Diagnosis   CLL (chronic lymphocytic leukemia) ([place])     HISTORY OF PRESENTING ILLNESS:  Please see previous note for details of initial presentation  INTERVAL HISTORY:  KARNA ABED is a 87 y.o. 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 She continues to be on acalabrutinib 100 mg p.o. daily and has been tolerating this without any new toxicities.  Notes some mild bruising on and off. Platelets are stable with her low-dose Promacta. Reasonable p.o. intake at this time. Followed by  pleural fullness and swelling from her lymphoma has significantly improved with treatment. - Labs done today were discussed in detail with the patient and her brother. MEDICAL HISTORY:  Past Medical History:  Diagnosis Date   Adenomatous colon polyp    tubular   Arthritis    osteo arthritis   Breast cancer ([place]) 02/16/2011   Diverticulosis    GERD (gastroesophageal reflux disease)    Hyperlipidemia    Leukemia ([place])    Lymphoma ([place])    small lympocytic   Osteopenia     SURGICAL HISTORY: Past Surgical History:  Procedure Laterality Date   bartholian cyst  1986   BREAST LUMPECTOMY Left 09-16-2010   CATARACT EXTRACTION, [place] ARTHROSCOPY Right 2010   LYMPH GLAND EXCISION Right 11/16/2014   Procedure: EXCISION AND BIOPSY OF RIGHT CERVICAL LYMPH NODE;  Surgeon: Jackolyn Confer, MD;  Location: [facility];  Service: General;  Laterality: Right;   TONSILLECTOMY  1940    SOCIAL HISTORY: Social History   Socioeconomic History   Marital status: Widowed    Spouse name: Not on file   Number of children: 5   Years of education: Not on file   Highest education level: Not on file  Occupational History   Occupation: retired  Tobacco Use   Smoking status: Former    Types: Cigarettes    Quit date: 1989    Years since quitting: 34.6   Smokeless tobacco: Never   Tobacco comments:    Quit smoking 27 years ago  Vaping Use   Vaping Use: Never used  Substance and Sexual Activity   Alcohol use: Yes    Comment:  occasional glass wine   Drug use: No   Sexual activity: Not Currently  Other Topics Concern   Not on file  Social History Narrative   Not on file   Social Determinants of Health   Financial Resource Strain: Not on file  Food Insecurity: Not on file  Transportation Needs: Not on file  Physical Activity: Not on file  Stress: Not on file  Social Connections: Not on file  Intimate Partner Violence: Not on  file    FAMILY HISTORY: Family History  Problem Relation Age of Onset   Colon cancer Mother 85   Colon cancer Maternal Aunt        had colon sugery   Leukemia Father    Diabetes Brother    Diabetes Brother     ALLERGIES:  is allergic to ciprofloxacin, loratadine-pseudoephedrine er, penicillins, and tolterodine tartrate.  MEDICATIONS:  Current Outpatient Medications  Medication Sig Dispense Refill   acalabrutinib maleate (CALQUENCE) 100 MG TABS Take 100 mg by mouth daily. 30 tablet 1   alendronate (FOSAMAX) 70 MG tablet Take 70 mg by mouth once a week. Take with a full glass of water on an empty stomach.     atorvastatin (LIPITOR) 10 MG tablet Take 10 mg by mouth daily.     cetirizine (ZYRTEC) 10 MG tablet TAKE ONE TABLET AT BEDTIME 30 tablet 0   Cyanocobalamin (B-12) 1000 MCG SUBL Place 1,000 mcg under the tongue daily. 30 tablet 11   eltrombopag (PROMACTA) 25 MG tablet Take 1 tablet (25 mg total) by mouth daily. Take on an empty stomach, 1 hour before a meal or 2 hours after. 30 tablet 2   valACYclovir (VALTREX) 500 MG tablet TAKE ONE TABLET BY MOUTH TWICE DAILY FOR 7 DAYS 14 tablet 0   Vitamin D, Ergocalciferol, (DRISDOL) 50000 UNITS CAPS capsule Take 50,000 Units by mouth every 7 (seven) days.     No current facility-administered medications for this visit.    REVIEW OF SYSTEMS:   10 Point review of Systems was done is negative except as noted above.  Exam performed in chair.  LABORATORY DATA:  I have reviewed the data as listed  .    Latest Ref Rng & Units 10/22/2021   12:11 PM 09/23/2021   10:44 AM 09/11/2021    1:21 PM  CBC  WBC 4.0 - 10.5 K/uL 16.0  14.6  20.0   Hemoglobin 12.0 - 15.0 g/dL 10.7  10.4  10.9   Hematocrit 36.0 - 46.0 % 33.3  33.8  34.6   Platelets 150 - 400 K/uL 101  59  78     .    Latest Ref Rng & Units 10/22/2021   12:11 PM 09/23/2021   10:44 AM 09/11/2021    1:21 PM  CMP  Glucose 70 - 99 mg/dL 114  92  78   BUN 8 - 23 mg/dL 15  15  16     Creatinine 0.44 - 1.00 mg/dL 0.90  0.94  0.92   Sodium 135 - 145 mmol/L 140  139  139   Potassium 3.5 - 5.1 mmol/L 4.0  4.4  4.4   Chloride 98 - 111 mmol/L 107  107  107   CO2 22 - 32 mmol/L 27  27  27    Calcium 8.9 - 10.3 mg/dL 9.1  9.2  9.3   Total Protein 6.5 - 8.1 g/dL 7.8  7.7  7.9   Total Bilirubin 0.3 - 1.2 mg/dL 0.5  0.5  0.4  Alkaline Phos 38 - 126 U/L 55  53  60   AST 15 - 41 U/L 16  20  21    ALT 0 - 44 U/L 8  10  11     Lab Results  Component Value Date   LDH 156 10/22/2021     Surgical Pathology  CASE: [id number]  PATIENT: Chelcey Olveda  Flow Pathology Report   Clinical history: right orbital mass   DIAGNOSIS:   - Monoclonal B-cell population identified, see comment.   COMMENT:   There is a monoclonal population of B-cells with expression of CD5 and  CD200. The phenotype is consistent with the patient's history of small  lymphocytic lymphoma.   02/21/18 Left Lacrimal sac biopsy:    12/18/14 Cytogenetics:    12/04/14 Right cervical lymph node biopsy:   06/27/18 FISH CLL Prognostic Panel:    RADIOGRAPHIC STUDIES: I have personally reviewed the radiological images as listed and agreed with the findings in the report. No results found.  ASSESSMENT & PLAN:   87 y.o. female with  1. Chronic Lymphocytic Leukemia 11/16/14 Right cervical lymph node biopsy revealed SLL/CLL 12/18/14 Cytogenetics revealed 59% of cells with gain of CEP12 and 5% of cells with loss of ATM 02/21/18 Right lacrimal gland revealed lymphoid infiltrates 06/27/18 FISH CLL Prognostic Panel revealed 49.33% of cells with Trisomy 12, and only 2% of cells with 17p mutation  08/03/18 PET/CT revealed "Exam positive for FDG avid adenopathy within the neck, chest, abdomen and pelvis compatible with the clinical history of chronic lymphocytic leukemia. 2. Splenomegaly (about 13cm) with mild increased radiotracer uptake throughout the spleen (above blood pool activity)".   2. History of  Invasive Ductal Carcinoma 09/16/10 Left breast 1.5cm IDC with calcifications, grade 1, perineural invasion. DCIS margins negative, 2 sentinel nodes negative, ER 99%, PR negative, Ki-67 at 79%, HER-2 negative, Oncotype 24, 16% ROR Continues on 15m Anastrozole daily, began on 10/25/10. Last available Mammogram on 12/31/16 negative  3.  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   -Right orbital biopsy from 11/23/2019 showed CLL/SLL.  -Received radiation treatment with temporary improvement.  4.  Leukocytosis/Anemia/Thrombocytopenia: -Secondary to CLL -Evidence of splenomegaly and possible element of ITP  5. Likely Dementia -Patient has significant cognitive deficits. Brother has been actively caring for her. She notes she is comfortable with her current living arrangement and does not want to move closer to her kids.  PLAN:  Lab results from today were reviewed in detail with the patient and her brother. CBC stable with some improvement in her platelet counts to 101k hemoglobin and WBC count stable CMP stable LDH within normal limits LDH at 173. Overall stable.  -Continue B complex 1 capsule p.o. daily -Patient continues to be on Promacta 25 mg p.o. daily with good control of her thrombocytopenia. She is again scratching herself and causing skin wounds in the upper arms and upper back.  Recommended to use calamine lotion and keeping the areas covered and only scratching it with the palm of her hand to avoid ulceration. -Continue Acalabrutinib 100 mg p.o. daily. If platelet count improves, we can increase to dose to 1080mpo BID.  FOLLOW UP: -Labs and f/u visit with Dr. KaIrene Limbon 4 weeks -Labs and f/u visit with Dr. KaIrene Limbon 8 weeks  All of the patient's questions were answered with apparent satisfaction. The patient knows to call the clinic with any problems, questions or concerns.  I have spent a total of 30 minutes minutes of face-to-face and non-face-to-face time,  preparing to see the patient,  performing a medically appropriate examination, counseling and educating the patient, documenting clinical information in the electronic health record,  and care coordination.   Dede Query PA-C Dept of Hematology and [place] at [facility] Phone: [phone]

## 2021-11-19 ENCOUNTER — Ambulatory Visit: Payer: Medicare HMO | Admitting: Hematology

## 2021-11-19 ENCOUNTER — Other Ambulatory Visit: Payer: Medicare HMO

## 2021-12-22 ENCOUNTER — Other Ambulatory Visit: Payer: Self-pay

## 2021-12-22 ENCOUNTER — Inpatient Hospital Stay: Payer: Medicare HMO | Attending: Hematology | Admitting: Hematology

## 2021-12-22 ENCOUNTER — Inpatient Hospital Stay: Payer: Medicare HMO

## 2021-12-22 VITALS — BP 138/52 | HR 70 | Temp 97.8°F | Resp 15 | Ht <= 58 in | Wt 118.6 lb

## 2021-12-22 DIAGNOSIS — Z87891 Personal history of nicotine dependence: Secondary | ICD-10-CM | POA: Insufficient documentation

## 2021-12-22 DIAGNOSIS — C9110 Chronic lymphocytic leukemia of B-cell type not having achieved remission: Secondary | ICD-10-CM

## 2021-12-22 DIAGNOSIS — Z79899 Other long term (current) drug therapy: Secondary | ICD-10-CM | POA: Diagnosis not present

## 2021-12-22 DIAGNOSIS — D693 Immune thrombocytopenic purpura: Secondary | ICD-10-CM

## 2021-12-22 LAB — CBC WITH DIFFERENTIAL (CANCER CENTER ONLY)
Abs Immature Granulocytes: 0.01 10*3/uL (ref 0.00–0.07)
Basophils Absolute: 0.1 10*3/uL (ref 0.0–0.1)
Basophils Relative: 0 %
Eosinophils Absolute: 0.1 10*3/uL (ref 0.0–0.5)
Eosinophils Relative: 1 %
HCT: 36.8 % (ref 36.0–46.0)
Hemoglobin: 11.6 g/dL — ABNORMAL LOW (ref 12.0–15.0)
Immature Granulocytes: 0 %
Lymphocytes Relative: 74 %
Lymphs Abs: 9.8 10*3/uL — ABNORMAL HIGH (ref 0.7–4.0)
MCH: 27.9 pg (ref 26.0–34.0)
MCHC: 31.5 g/dL (ref 30.0–36.0)
MCV: 88.5 fL (ref 80.0–100.0)
Monocytes Absolute: 0.5 10*3/uL (ref 0.1–1.0)
Monocytes Relative: 4 %
Neutro Abs: 2.8 10*3/uL (ref 1.7–7.7)
Neutrophils Relative %: 21 %
Platelet Count: 117 10*3/uL — ABNORMAL LOW (ref 150–400)
RBC: 4.16 MIL/uL (ref 3.87–5.11)
RDW: 15.1 % (ref 11.5–15.5)
WBC Count: 13.3 10*3/uL — ABNORMAL HIGH (ref 4.0–10.5)
nRBC: 0 % (ref 0.0–0.2)

## 2021-12-22 LAB — CMP (CANCER CENTER ONLY)
ALT: 11 U/L (ref 0–44)
AST: 21 U/L (ref 15–41)
Albumin: 4 g/dL (ref 3.5–5.0)
Alkaline Phosphatase: 57 U/L (ref 38–126)
Anion gap: 6 (ref 5–15)
BUN: 20 mg/dL (ref 8–23)
CO2: 28 mmol/L (ref 22–32)
Calcium: 9.2 mg/dL (ref 8.9–10.3)
Chloride: 104 mmol/L (ref 98–111)
Creatinine: 1.02 mg/dL — ABNORMAL HIGH (ref 0.44–1.00)
GFR, Estimated: 54 mL/min — ABNORMAL LOW (ref >60)
Glucose, Bld: 87 mg/dL (ref 70–99)
Potassium: 4.6 mmol/L (ref 3.5–5.1)
Sodium: 138 mmol/L (ref 135–145)
Total Bilirubin: 0.6 mg/dL (ref 0.3–1.2)
Total Protein: 8 g/dL (ref 6.5–8.1)

## 2021-12-22 LAB — LACTATE DEHYDROGENASE: LDH: 162 U/L (ref 98–192)

## 2021-12-22 MED ORDER — CALQUENCE 100 MG PO TABS: 100.0000 mg | ORAL_TABLET | Freq: Every day | ORAL | 1 refills | Status: DC

## 2021-12-22 MED ORDER — ELTROMBOPAG OLAMINE 25 MG PO TABS: 25.0000 mg | ORAL_TABLET | Freq: Every day | ORAL | 2 refills | Status: DC

## 2021-12-22 NOTE — Progress Notes (Signed)
HEMATOLOGY/ONCOLOGY CLINIC NOTE  Date of Service:10/22/2021   Patient Care Team: [name], MD as PCP - General (Internal Medicine)  CHIEF COMPLAINTS/PURPOSE OF CONSULTATION:  Follow-up for continued evaluation and management of CLL  Oncologic History:  Oncology History  Breast cancer of upper-outer quadrant of left female breast ([place])  09/16/2010 Surgery   Left breast 1.5 cm invasive ductal carcinoma with calcifications, grade 1, perineural invasion was seen, DCIS, margins negative, 2 sentinel nodes negative, ER 99%, PR negative, Ki-67 79%, HER-2 negative, Oncotype 24, 16% ROR   10/25/2010 -  Anti-estrogen oral therapy   Arimidex 1 mg daily developed side effects and was held briefly and restarted   Lymphoma, small lymphocytic ([place])  11/16/2014 Initial Diagnosis   Lymphoma, small lymphocytic: Based on cervical lymph node biopsy CD5 positive CD10 negative   CLL (chronic lymphocytic leukemia) ([place])  12/12/2020 Cancer Staging   Staging form: Chronic Lymphocytic Leukemia / Small Lymphocytic Lymphoma, AJCC 8th Edition - Clinical stage from 12/12/2020: Modified Rai Stage IV (Modified Rai risk: High, Lymphocytosis: Present, Adenopathy: Present, Organomegaly: Present, Anemia: Present, Thrombocytopenia: Present) - Signed by [name], MD on 12/15/2020 Stage prefix: Post-therapy Response to neoadjuvant therapy: Unknown   12/15/2020 Initial Diagnosis   CLL (chronic lymphocytic leukemia) ([place])     HISTORY OF PRESENTING ILLNESS:  Please see previous note for details of initial presentation  INTERVAL HISTORY:  Brianna York is a 87 y.o. 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 She was last seen by me on 10/22/21.  She reports that her skin wounds on her shoulders are still irritating her. Her brother reports she has been scratching the skin wounds, which is the reason it is irritating. She reports that the rash is not causing  problems with her sleeping. She denies night sweats. She has tried calamine spray, but it has not helped.   She reports her appetite is good, but reports she has lost some weight. Her brother reports that she has not been drinking water and only has 2 cups of coffee per day.  She denies any swelling or abdominal pain. She reports that her skin feels hot, but she does not have a fever.    She reports her memory has been bad, which is due to dementia.   Labs done today were discussed in details with her and her brother.   MEDICAL HISTORY:  Past Medical History:  Diagnosis Date   Adenomatous colon polyp    tubular   Arthritis    osteo arthritis   Breast cancer ([place]) 02/16/2011   Diverticulosis    GERD (gastroesophageal reflux disease)    Hyperlipidemia    Leukemia ([place])    Lymphoma ([place])    small lympocytic   Osteopenia     SURGICAL HISTORY: Past Surgical History:  Procedure Laterality Date   bartholian cyst  1986   BREAST LUMPECTOMY Left 09-16-2010   CATARACT EXTRACTION, [place] ARTHROSCOPY Right 2010   LYMPH GLAND EXCISION Right 11/16/2014   Procedure: EXCISION AND BIOPSY OF RIGHT CERVICAL LYMPH NODE;  Surgeon: Jackolyn Confer, MD;  Location: [facility];  Service: General;  Laterality: Right;   TONSILLECTOMY  1940    SOCIAL HISTORY: Social History   Socioeconomic History   Marital status: Widowed    Spouse name: Not on file   Number of children: 5   Years of education:  Not on file   Highest education level: Not on file  Occupational History   Occupation: retired  Tobacco Use   Smoking status: Former    Types: Cigarettes    Quit date: 1989    Years since quitting: 34.7   Smokeless tobacco: Never   Tobacco comments:    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   Vaping Use: Never used  Substance and Sexual Activity   Alcohol use: Yes    Comment: occasional glass wine   Drug use: No   Sexual activity:  Not Currently  Other Topics Concern   Not on file  Social History Narrative   Not on file   Social Determinants of Health   Financial Resource Strain: Not on file  Food Insecurity: Not on file  Transportation Needs: Not on file  Physical Activity: Not on file  Stress: Not on file  Social Connections: Not on file  Intimate Partner Violence: Not on file    FAMILY HISTORY: Family History  Problem Relation Age of Onset   Colon cancer Mother 58   Colon cancer Maternal Aunt        had colon sugery   Leukemia Father    Diabetes Brother    Diabetes Brother     ALLERGIES:  is allergic to ciprofloxacin, loratadine-pseudoephedrine er, penicillins, and tolterodine tartrate.  MEDICATIONS:  Current Outpatient Medications  Medication Sig Dispense Refill   acalabrutinib maleate (CALQUENCE) 100 MG TABS Take 100 mg by mouth daily. 30 tablet 1   alendronate (FOSAMAX) 70 MG tablet Take 70 mg by mouth once a week. Take with a full glass of water on an empty stomach.     atorvastatin (LIPITOR) 10 MG tablet Take 10 mg by mouth daily.     cetirizine (ZYRTEC) 10 MG tablet TAKE ONE TABLET AT BEDTIME 30 tablet 0   Cyanocobalamin (B-12) 1000 MCG SUBL Place 1,000 mcg under the tongue daily. 30 tablet 11   eltrombopag (PROMACTA) 25 MG tablet Take 1 tablet (25 mg total) by mouth daily. Take on an empty stomach, 1 hour before a meal or 2 hours after. 30 tablet 2   valACYclovir (VALTREX) 500 MG tablet TAKE ONE TABLET BY MOUTH TWICE DAILY FOR 7 DAYS 14 tablet 0   Vitamin D, Ergocalciferol, (DRISDOL) 50000 UNITS CAPS capsule Take 50,000 Units by mouth every 7 (seven) days.     No current facility-administered medications for this visit.    REVIEW OF SYSTEMS:   10 Point review of Systems was done is negative except as noted above.  PHYSICAL EXAMINATION .BP (!) 138/52 (BP Location: Left Arm, Patient Position: Sitting) Comment: nurse  notified  Pulse 70   Temp 97.8 F (36.6 C) (Temporal)   Resp 15    Ht 4' 8" (1.422 m)   Wt 118 lb 9.6 oz (53.8 kg)   SpO2 99%   BMI 26.59 kg/m  . GENERAL:alert, in no acute distress and comfortable SKIN: healing and someopen scratch wounds on the upper back and upper arms. EYES: conjunctiva are pink and non-injected, sclera anicteric OROPHARYNX: MMM, no exudates, no oropharyngeal erythema or ulceration NECK: supple, no JVD LYMPH:  no palpable lymphadenopathy in the cervical, axillary or inguinal regions LUNGS: clear to auscultation b/l with normal respiratory effort HEART: regular rate & rhythm ABDOMEN:  normoactive bowel sounds , non tender, not distended. Extremity: no pedal edema PSYCH: alert & oriented x 3 with fluent speech NEURO: no focal motor/sensory deficits  LABORATORY DATA:  I have  reviewed the data as listed  .    Latest Ref Rng & Units 12/22/2021    1:22 PM 10/22/2021   12:11 PM 09/23/2021   10:44 AM  CBC  WBC 4.0 - 10.5 K/uL 13.3  16.0  14.6   Hemoglobin 12.0 - 15.0 g/dL 11.6  10.7  10.4   Hematocrit 36.0 - 46.0 % 36.8  33.3  33.8   Platelets 150 - 400 K/uL 117  101  59     .    Latest Ref Rng & Units 12/22/2021    1:22 PM 10/22/2021   12:11 PM 09/23/2021   10:44 AM  CMP  Glucose 70 - 99 mg/dL 87  114  92   BUN 8 - 23 mg/dL _0 Creatinine 0.44 - 1.00 mg/dL 1.02  0.90  0.94   Sodium 135 - 145 mmol/L 138  140  139   Potassium 3.5 - 5.1 mmol/L 4.6  4.0  4.4   Chloride 98 - 111 mmol/L 104  107  107   CO2 22 - 32 mmol/L _1 Calcium 8.9 - 10.3 mg/dL 9.2  9.1  9.2   Total Protein 6.5 - 8.1 g/dL 8.0  7.8  7.7   Total Bilirubin 0.3 - 1.2 mg/dL 0.6  0.5  0.5   Alkaline Phos 38 - 126 U/L 57  55  53   AST 15 - 41 U/L _2 ALT 0 - 44 U/L _3 Lab Results  Component Value Date   LDH 156 10/22/2021     Surgical Pathology  CASE: [id number]  PATIENT: Lacinda Myrick  Flow Pathology Report   Clinical history: right orbital mass   DIAGNOSIS:   - Monoclonal B-cell population identified,  see comment.   COMMENT:   There is a monoclonal population of B-cells with expression of CD5 and  CD200. The phenotype is consistent with the patient's history of small  lymphocytic lymphoma.   02/21/18 Left Lacrimal sac biopsy:    12/18/14 Cytogenetics:    12/04/14 Right cervical lymph node biopsy:   06/27/18 FISH CLL Prognostic Panel:    RADIOGRAPHIC STUDIES: I have personally reviewed the radiological images as listed and agreed with the findings in the report. No results found.  ASSESSMENT & PLAN:   87 y.o. female with  1. Chronic Lymphocytic Leukemia 11/16/14 Right cervical lymph node biopsy revealed SLL/CLL 12/18/14 Cytogenetics revealed 59% of cells with gain of CEP12 and 5% of cells with loss of ATM 02/21/18 Right lacrimal gland revealed lymphoid infiltrates 06/27/18 FISH CLL Prognostic Panel revealed 49.33% of cells with Trisomy 12, and only 2% of cells with 17p mutation  08/03/18 PET/CT revealed "Exam positive for FDG avid adenopathy within the neck, chest, abdomen and pelvis compatible with the clinical history of chronic lymphocytic leukemia. 2. Splenomegaly (about 13cm) with mild increased radiotracer uptake throughout the spleen (above blood pool activity)".   2. History of Invasive Ductal Carcinoma 09/16/10 Left breast 1.5cm IDC with calcifications, grade 1, perineural invasion. DCIS margins negative, 2 sentinel nodes negative, ER 99%, PR negative, Ki-67 at 79%, HER-2 negative, Oncotype 24, 16% ROR Continues on 64m Anastrozole daily, began on 10/25/10. Last available Mammogram on 12/31/16 negative  3.  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   -Right orbital biopsy from 11/23/2019 showed CLL/SLL.  -Received radiation treatment with temporary improvement.  4.  Leukocytosis/Anemia/Thrombocytopenia: -Secondary to  CLL -Evidence of splenomegaly and possible element of ITP  5. Likely Dementia -Patient has significant cognitive deficits. Brother  has been actively caring for her. She notes she is comfortable with her current living arrangement and does not want to move closer to her kids.  PLAN:  -Lab results from today were reviewed in detail with the patient and her brother. CBC result showed platelet counts of 117k hemoglobin of 11.6k, and WBC count of 13.3k. CMP is stable. -LDH WNL at 162. -Continue B complex 1 capsule p.o. daily -Patient continues to be on Promacta 25 mg p.o. daily with good control of her thrombocytopenia. -She is again scratching herself and causing skin wounds in the upper arms and upper back.  Recommended to use calamine spray and keeping the areas covered and only scratching it with the palm of her hand to avoid ulceration.  -Continue Acalabrutinib 100 mg p.o. daily. If platelet count improves, we can increase to dose to 152m po BID. -If her platelets continue to improve we shall consider weaning off her Promacta   FOLLOW UP:     Check-out note: RTC with Dr KIrene Limbowith labs in 10 weeks   The total time spent in the appointment was 25 minutes* .  All of the patient's questions were answered with apparent satisfaction. The patient knows to call the clinic with any problems, questions or concerns.  I, AEugene Gavia am acting as scribe for Dr. GSullivan Lone MD  .I have reviewed the above documentation for accuracy and completeness, and I agree with the above.  GSullivan LoneMD MS AAHIVMS SDover Emergency RoomCMontgomery Surgery Center Limited Partnership Dba Montgomery Surgery CenterHematology/Oncology Physician CMiddletown Endoscopy Asc LLC .*Total Encounter Time as defined by the Centers for Medicare and Medicaid Services includes, in addition to the face-to-face time of a patient visit (documented in the note above) non-face-to-face time: obtaining and reviewing outside history, ordering and reviewing medications, tests or procedures, care coordination (communications with other health care professionals or caregivers) and documentation in the medical record.

## 2021-12-28 ENCOUNTER — Encounter: Payer: Self-pay | Admitting: Hematology

## 2022-01-02 ENCOUNTER — Telehealth: Payer: Self-pay | Admitting: Hematology

## 2022-01-02 NOTE — Telephone Encounter (Signed)
Per 10/20 in basket called and spoke to daughter and left message for pt about appointment

## 2022-01-14 ENCOUNTER — Other Ambulatory Visit (HOSPITAL_COMMUNITY): Payer: Self-pay

## 2022-01-14 ENCOUNTER — Telehealth: Payer: Self-pay | Admitting: Pharmacy Technician

## 2022-01-14 NOTE — Telephone Encounter (Signed)
Oral Oncology Patient Advocate Encounter   Received notification that patient is due for re-enrollment for assistance for Promacta through [employer].   Re-enrollment process has been initiated and will be submitted upon completion of necessary documents.  Documents will be signed via docusign. Email confirmed: [email or web address]  [employer] phone number [phone].   I will continue to follow until final determination.  Brianna York, CPhT-Adv Oncology Pharmacy Patient [place] Direct Number: [phone]  Fax: [phone]

## 2022-01-16 NOTE — Telephone Encounter (Signed)
Oral Oncology Patient Advocate Encounter   Submitted application for assistance for Promacta to [employer].   Application submitted via e-fax to [phone]   [employer] phone number [phone].   I will continue to check the status until final determination.   Parnell Spieler, CPhT-Adv Oncology Pharmacy Patient Advocate [facility] Direct Number: [phone]  Fax: [phone]   

## 2022-01-28 NOTE — Telephone Encounter (Signed)
Oral Oncology Patient Advocate Encounter   Received notification re-enrollment for assistance for Promacta through [employer] has been approved. Patient may continue to receive their medication at $0 from this program.    [employer] phone number [phone].   Effective dates: 03/16/22 through 03/16/23  I have spoken to the patient.  Andrian Sabala, CPhT-Adv Oncology Pharmacy Patient Advocate [facility] Direct Number: [phone]  Fax: [phone]   

## 2022-02-17 ENCOUNTER — Other Ambulatory Visit: Payer: Self-pay

## 2022-02-17 DIAGNOSIS — C9110 Chronic lymphocytic leukemia of B-cell type not having achieved remission: Secondary | ICD-10-CM

## 2022-02-17 MED ORDER — CALQUENCE 100 MG PO TABS: 100.0000 mg | ORAL_TABLET | Freq: Every day | ORAL | 1 refills | Status: DC

## 2022-03-05 ENCOUNTER — Other Ambulatory Visit (HOSPITAL_BASED_OUTPATIENT_CLINIC_OR_DEPARTMENT_OTHER): Payer: Self-pay

## 2022-03-13 ENCOUNTER — Inpatient Hospital Stay: Payer: Medicare HMO | Attending: Hematology | Admitting: Hematology

## 2022-03-13 ENCOUNTER — Other Ambulatory Visit: Payer: Self-pay

## 2022-03-13 ENCOUNTER — Inpatient Hospital Stay: Payer: Medicare HMO

## 2022-03-13 VITALS — BP 127/62 | HR 81 | Temp 97.7°F | Resp 16 | Ht <= 58 in | Wt 121.9 lb

## 2022-03-13 DIAGNOSIS — C9110 Chronic lymphocytic leukemia of B-cell type not having achieved remission: Secondary | ICD-10-CM | POA: Insufficient documentation

## 2022-03-13 DIAGNOSIS — D693 Immune thrombocytopenic purpura: Secondary | ICD-10-CM | POA: Insufficient documentation

## 2022-03-13 DIAGNOSIS — D696 Thrombocytopenia, unspecified: Secondary | ICD-10-CM | POA: Diagnosis not present

## 2022-03-13 LAB — CBC WITH DIFFERENTIAL (CANCER CENTER ONLY)
Abs Immature Granulocytes: 0.01 10*3/uL (ref 0.00–0.07)
Basophils Absolute: 0 10*3/uL (ref 0.0–0.1)
Basophils Relative: 0 %
Eosinophils Absolute: 0.1 10*3/uL (ref 0.0–0.5)
Eosinophils Relative: 1 %
HCT: 34.9 % — ABNORMAL LOW (ref 36.0–46.0)
Hemoglobin: 11.1 g/dL — ABNORMAL LOW (ref 12.0–15.0)
Immature Granulocytes: 0 %
Lymphocytes Relative: 55 %
Lymphs Abs: 5.2 10*3/uL — ABNORMAL HIGH (ref 0.7–4.0)
MCH: 27.5 pg (ref 26.0–34.0)
MCHC: 31.8 g/dL (ref 30.0–36.0)
MCV: 86.4 fL (ref 80.0–100.0)
Monocytes Absolute: 0.5 10*3/uL (ref 0.1–1.0)
Monocytes Relative: 5 %
Neutro Abs: 3.7 10*3/uL (ref 1.7–7.7)
Neutrophils Relative %: 39 %
Platelet Count: 133 10*3/uL — ABNORMAL LOW (ref 150–400)
RBC: 4.04 MIL/uL (ref 3.87–5.11)
RDW: 14 % (ref 11.5–15.5)
WBC Count: 9.5 10*3/uL (ref 4.0–10.5)
nRBC: 0 % (ref 0.0–0.2)

## 2022-03-13 LAB — CMP (CANCER CENTER ONLY)
ALT: 10 U/L (ref 0–44)
AST: 19 U/L (ref 15–41)
Albumin: 3.9 g/dL (ref 3.5–5.0)
Alkaline Phosphatase: 50 U/L (ref 38–126)
Anion gap: 6 (ref 5–15)
BUN: 13 mg/dL (ref 8–23)
CO2: 26 mmol/L (ref 22–32)
Calcium: 9.2 mg/dL (ref 8.9–10.3)
Chloride: 106 mmol/L (ref 98–111)
Creatinine: 0.86 mg/dL (ref 0.44–1.00)
GFR, Estimated: >60 mL/min (ref >60)
Glucose, Bld: 119 mg/dL — ABNORMAL HIGH (ref 70–99)
Potassium: 4.2 mmol/L (ref 3.5–5.1)
Sodium: 138 mmol/L (ref 135–145)
Total Bilirubin: 0.7 mg/dL (ref 0.3–1.2)
Total Protein: 7.6 g/dL (ref 6.5–8.1)

## 2022-03-13 LAB — LACTATE DEHYDROGENASE: LDH: 177 U/L (ref 98–192)

## 2022-03-19 ENCOUNTER — Encounter: Payer: Self-pay | Admitting: Hematology

## 2022-03-19 NOTE — Progress Notes (Signed)
HEMATOLOGY/ONCOLOGY CLINIC NOTE  Date of Service:03/13/2022    Patient Care Team: [name], MD as PCP - General (Internal Medicine)  CHIEF COMPLAINTS/PURPOSE OF CONSULTATION:  Follow-up for continued valuation and management of CLL  Oncologic History:  Oncology History  Breast cancer of upper-outer quadrant of left female breast ([place])  09/16/2010 Surgery   Left breast 1.5 cm invasive ductal carcinoma with calcifications, grade 1, perineural invasion was seen, DCIS, margins negative, 2 sentinel nodes negative, ER 99%, PR negative, Ki-67 79%, HER-2 negative, Oncotype 24, 16% ROR   10/25/2010 -  Anti-estrogen oral therapy   Arimidex 1 mg daily developed side effects and was held briefly and restarted   Lymphoma, small lymphocytic ([place])  11/16/2014 Initial Diagnosis   Lymphoma, small lymphocytic: Based on cervical lymph node biopsy CD5 positive CD10 negative   CLL (chronic lymphocytic leukemia) ([place])  12/12/2020 Cancer Staging   Staging form: Chronic Lymphocytic Leukemia / Small Lymphocytic Lymphoma, AJCC 8th Edition - Clinical stage from 12/12/2020: Modified Rai Stage IV (Modified Rai risk: High, Lymphocytosis: Present, Adenopathy: Present, Organomegaly: Present, Anemia: Present, Thrombocytopenia: Present) - Signed by [name], MD on 12/15/2020 Stage prefix: Post-therapy Response to neoadjuvant therapy: Unknown   12/15/2020 Initial Diagnosis   CLL (chronic lymphocytic leukemia) ([place])     HISTORY OF PRESENTING ILLNESS:  Please see previous note for details of initial presentation  INTERVAL HISTORY:  Brianna York is a 88 y.o. female i patient will continue evaluation and management of her CLL and ITP related thrombocytopenia.Patient notes no acute new symptoms since her last clinic visit. No fevers no chills no night sweats no unexpected weight loss. Still has some skin erosions/injuries from scratching compulsively over her posterior upper back and  arms. Recommended using Sarna lotion and not scratching with her nails. No significant bleeding issues.  MEDICAL HISTORY:  Past Medical History:  Diagnosis Date   Adenomatous colon polyp    tubular   Arthritis    osteo arthritis   Breast cancer ([place]) 02/16/2011   Diverticulosis    GERD (gastroesophageal reflux disease)    Hyperlipidemia    Leukemia ([place])    Lymphoma ([place])    small lympocytic   Osteopenia     SURGICAL HISTORY: Past Surgical History:  Procedure Laterality Date   bartholian cyst  1986   BREAST LUMPECTOMY Left 09-16-2010   CATARACT EXTRACTION, [place] ARTHROSCOPY Right 2010   LYMPH GLAND EXCISION Right 11/16/2014   Procedure: EXCISION AND BIOPSY OF RIGHT CERVICAL LYMPH NODE;  Surgeon: Jackolyn Confer, MD;  Location: [facility];  Service: General;  Laterality: Right;   TONSILLECTOMY  1940    SOCIAL HISTORY: Social History   Socioeconomic History   Marital status: Widowed    Spouse name: Not on file   Number of children: 5   Years of education: Not on file   Highest education level: Not on file  Occupational History   Occupation: retired  Tobacco Use   Smoking status: Former    Types: Cigarettes    Quit date: 1989    Years since quitting: 35.0   Smokeless tobacco: Never   Tobacco comments:    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   Vaping Use: Never used  Substance and Sexual Activity   Alcohol use: Yes    Comment: occasional glass wine   Drug use: No   Sexual activity: Not Currently  Other  Topics Concern   Not on file  Social History Narrative   Not on file   Social Determinants of Health   Financial Resource Strain: Not on file  Food Insecurity: Not on file  Transportation Needs: Not on file  Physical Activity: Not on file  Stress: Not on file  Social Connections: Not on file  Intimate Partner Violence: Not on file    FAMILY HISTORY: Family History  Problem Relation Age  of Onset   Colon cancer Mother 18   Colon cancer Maternal Aunt        had colon sugery   Leukemia Father    Diabetes Brother    Diabetes Brother     ALLERGIES:  is allergic to ciprofloxacin, loratadine-pseudoephedrine er, penicillins, and tolterodine tartrate.  MEDICATIONS:  Current Outpatient Medications  Medication Sig Dispense Refill   acalabrutinib maleate (CALQUENCE) 100 MG tablet Take 1 tablet (100 mg total) by mouth daily. 30 tablet 1   alendronate (FOSAMAX) 70 MG tablet Take 70 mg by mouth once a week. Take with a full glass of water on an empty stomach.     atorvastatin (LIPITOR) 10 MG tablet Take 10 mg by mouth daily.     cetirizine (ZYRTEC) 10 MG tablet TAKE ONE TABLET AT BEDTIME 30 tablet 0   Cyanocobalamin (B-12) 1000 MCG SUBL Place 1,000 mcg under the tongue daily. 30 tablet 11   eltrombopag (PROMACTA) 25 MG tablet Take 1 tablet (25 mg total) by mouth daily. Take on an empty stomach, 1 hour before a meal or 2 hours after. 30 tablet 2   valACYclovir (VALTREX) 500 MG tablet TAKE ONE TABLET BY MOUTH TWICE DAILY FOR 7 DAYS 14 tablet 0   Vitamin D, Ergocalciferol, (DRISDOL) 50000 UNITS CAPS capsule Take 50,000 Units by mouth every 7 (seven) days.     No current facility-administered medications for this visit.    REVIEW OF SYSTEMS:   10 Point review of Systems was done is negative except as noted above.  PHYSICAL EXAMINATION .BP 127/62 (BP Location: Left Arm, Patient Position: Sitting)   Pulse 81   Temp 97.7 F (36.5 C) (Temporal)   Resp 16   Ht 4' 8" (1.422 m)   Wt 121 lb 14.4 oz (55.3 kg)   SpO2 100%   BMI 27.33 kg/m  . GENERAL:alert, in no acute distress and comfortable SKIN: healing  scratch wounds on the upper back and upper arms. EYES: conjunctiva are pink and non-injected, sclera anicteric OROPHARYNX: MMM, no exudates, no oropharyngeal erythema or ulceration NECK: supple, no JVD LYMPH:  no palpable lymphadenopathy in the cervical, axillary or inguinal  regions LUNGS: clear to auscultation b/l with normal respiratory effort HEART: regular rate & rhythm ABDOMEN:  normoactive bowel sounds , non tender, not distended. Extremity: no pedal edema PSYCH: alert & oriented x 3 with fluent speech NEURO: no focal motor/sensory deficits   LABORATORY DATA:  I have reviewed the data as listed  .    Latest Ref Rng & Units 03/13/2022   11:21 AM 12/22/2021    1:22 PM 10/22/2021   12:11 PM  CBC  WBC 4.0 - 10.5 K/uL 9.5  13.3  16.0   Hemoglobin 12.0 - 15.0 g/dL 11.1  11.6  10.7   Hematocrit 36.0 - 46.0 % 34.9  36.8  33.3   Platelets 150 - 400 K/uL 133  117  101     .    Latest Ref Rng & Units 03/13/2022   11:21 AM 12/22/2021  1:22 PM 10/22/2021   12:11 PM  CMP  Glucose 70 - 99 mg/dL 119  87  114   BUN 8 - 23 mg/dL _0 Creatinine 0.44 - 1.00 mg/dL 0.86  1.02  0.90   Sodium 135 - 145 mmol/L 138  138  140   Potassium 3.5 - 5.1 mmol/L 4.2  4.6  4.0   Chloride 98 - 111 mmol/L 106  104  107   CO2 22 - 32 mmol/L _1 Calcium 8.9 - 10.3 mg/dL 9.2  9.2  9.1   Total Protein 6.5 - 8.1 g/dL 7.6  8.0  7.8   Total Bilirubin 0.3 - 1.2 mg/dL 0.7  0.6  0.5   Alkaline Phos 38 - 126 U/L 50  57  55   AST 15 - 41 U/L _2 ALT 0 - 44 U/L _3 Lab Results  Component Value Date   LDH 177 03/13/2022     Surgical Pathology  CASE: [id number]  PATIENT: Obie Talley  Flow Pathology Report   Clinical history: right orbital mass   DIAGNOSIS:   - Monoclonal B-cell population identified, see comment.   COMMENT:   There is a monoclonal population of B-cells with expression of CD5 and  CD200. The phenotype is consistent with the patient's history of small  lymphocytic lymphoma.   02/21/18 Left Lacrimal sac biopsy:    12/18/14 Cytogenetics:    12/04/14 Right cervical lymph node biopsy:   06/27/18 FISH CLL Prognostic Panel:    RADIOGRAPHIC STUDIES: I have personally reviewed the radiological images as  listed and agreed with the findings in the report. No results found.  ASSESSMENT & PLAN:   88 y.o. female with  1. Chronic Lymphocytic Leukemia 11/16/14 Right cervical lymph node biopsy revealed SLL/CLL 12/18/14 Cytogenetics revealed 59% of cells with gain of CEP12 and 5% of cells with loss of ATM 02/21/18 Right lacrimal gland revealed lymphoid infiltrates 06/27/18 FISH CLL Prognostic Panel revealed 49.33% of cells with Trisomy 12, and only 2% of cells with 17p mutation  08/03/18 PET/CT revealed "Exam positive for FDG avid adenopathy within the neck, chest, abdomen and pelvis compatible with the clinical history of chronic lymphocytic leukemia. 2. Splenomegaly (about 13cm) with mild increased radiotracer uptake throughout the spleen (above blood pool activity)".   2. History of Invasive Ductal Carcinoma 09/16/10 Left breast 1.5cm IDC with calcifications, grade 1, perineural invasion. DCIS margins negative, 2 sentinel nodes negative, ER 99%, PR negative, Ki-67 at 79%, HER-2 negative, Oncotype 24, 16% ROR Continues on 30m Anastrozole daily, began on 10/25/10. Last available Mammogram on 12/31/16 negative  3.  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   -Right orbital biopsy from 11/23/2019 showed CLL/SLL.  -Received radiation treatment with temporary improvement.  4.  Leukocytosis/Anemia/Thrombocytopenia: -Secondary to CLL -Evidence of splenomegaly and possible element of ITP  5. Likely Dementia -Patient has significant cognitive deficits. Brother has been actively caring for her. She notes she is comfortable with her current living arrangement and does not want to move closer to her kids.  PLAN:  - lab results from today were discussed in detail with the patient CBC and CMP within normal limits LDH within normal limits at 177 Patient has no bleeding issues or other notable toxicities from her acalabrutinib -Will continue acalabrutinib at 100 mg p.o. daily -Reduce  Promacta to 5 days a week from Monday  through Friday and hold the dose on Saturday and Sunday.  We shall try to wean her off the Promacta if possible. -Continue B complex 1 capsule p.o. daily --If her platelets continue to improve we shall consider weaning off her Promacta   FOLLOW UP:   Phone visit in 6 weeks with labs the day before RTC with Dr Irene Limbo with labs in 3 months   The total time spent in the appointment was 25 minutes*.  All of the patient's questions were answered with apparent satisfaction. The patient knows to call the clinic with any problems, questions or concerns.   Sullivan Lone MD MS AAHIVMS [facility] Hematology/Oncology Physician [facility]  .*Total Encounter Time as defined by the Centers for Medicare and Medicaid Services includes, in addition to the face-to-face time of a patient visit (documented in the note above) non-face-to-face time: obtaining and reviewing outside history, ordering and reviewing medications, tests or procedures, care coordination (communications with other health care professionals or caregivers) and documentation in the medical record.

## 2022-04-22 ENCOUNTER — Inpatient Hospital Stay: Payer: Medicare HMO

## 2022-04-23 ENCOUNTER — Inpatient Hospital Stay: Payer: Medicare HMO

## 2022-04-24 ENCOUNTER — Inpatient Hospital Stay: Payer: Medicare HMO | Admitting: Hematology

## 2022-04-28 ENCOUNTER — Telehealth: Payer: Self-pay | Admitting: Hematology

## 2022-04-28 NOTE — Telephone Encounter (Signed)
Called patient to r/s missed 2/8 and 2/9 appointments. Left voicemail for patient to call us back.

## 2022-04-28 NOTE — Telephone Encounter (Signed)
Called patient to reschedule missed appointments and spoke with patient's brother/caretaker. Patient rescheduled and notified. Updates made to demographics.

## 2022-04-30 ENCOUNTER — Inpatient Hospital Stay: Payer: Medicare HMO

## 2022-05-01 ENCOUNTER — Inpatient Hospital Stay: Payer: Medicare HMO | Admitting: Hematology

## 2022-05-07 ENCOUNTER — Other Ambulatory Visit: Payer: Self-pay

## 2022-05-07 ENCOUNTER — Inpatient Hospital Stay: Payer: Medicare HMO | Attending: Hematology

## 2022-05-07 DIAGNOSIS — D693 Immune thrombocytopenic purpura: Secondary | ICD-10-CM | POA: Diagnosis present

## 2022-05-07 DIAGNOSIS — C9110 Chronic lymphocytic leukemia of B-cell type not having achieved remission: Secondary | ICD-10-CM | POA: Diagnosis present

## 2022-05-07 LAB — CBC WITH DIFFERENTIAL (CANCER CENTER ONLY)
Abs Immature Granulocytes: 0.03 10*3/uL (ref 0.00–0.07)
Basophils Absolute: 0 10*3/uL (ref 0.0–0.1)
Basophils Relative: 0 %
Eosinophils Absolute: 0.1 10*3/uL (ref 0.0–0.5)
Eosinophils Relative: 1 %
HCT: 36.6 % (ref 36.0–46.0)
Hemoglobin: 11.9 g/dL — ABNORMAL LOW (ref 12.0–15.0)
Immature Granulocytes: 0 %
Lymphocytes Relative: 58 %
Lymphs Abs: 7.5 10*3/uL — ABNORMAL HIGH (ref 0.7–4.0)
MCH: 26.9 pg (ref 26.0–34.0)
MCHC: 32.5 g/dL (ref 30.0–36.0)
MCV: 82.6 fL (ref 80.0–100.0)
Monocytes Absolute: 0.9 10*3/uL (ref 0.1–1.0)
Monocytes Relative: 7 %
Neutro Abs: 4.3 10*3/uL (ref 1.7–7.7)
Neutrophils Relative %: 34 %
Platelet Count: 244 10*3/uL (ref 150–400)
RBC: 4.43 MIL/uL (ref 3.87–5.11)
RDW: 14.8 % (ref 11.5–15.5)
WBC Count: 12.8 10*3/uL — ABNORMAL HIGH (ref 4.0–10.5)
nRBC: 0 % (ref 0.0–0.2)

## 2022-05-07 LAB — CMP (CANCER CENTER ONLY)
ALT: 8 U/L (ref 0–44)
AST: 18 U/L (ref 15–41)
Albumin: 3.9 g/dL (ref 3.5–5.0)
Alkaline Phosphatase: 69 U/L (ref 38–126)
Anion gap: 8 (ref 5–15)
BUN: 9 mg/dL (ref 8–23)
CO2: 26 mmol/L (ref 22–32)
Calcium: 8.9 mg/dL (ref 8.9–10.3)
Chloride: 98 mmol/L (ref 98–111)
Creatinine: 0.8 mg/dL (ref 0.44–1.00)
GFR, Estimated: >60 mL/min (ref >60)
Glucose, Bld: 91 mg/dL (ref 70–99)
Potassium: 4.3 mmol/L (ref 3.5–5.1)
Sodium: 132 mmol/L — ABNORMAL LOW (ref 135–145)
Total Bilirubin: 0.6 mg/dL (ref 0.3–1.2)
Total Protein: 7.4 g/dL (ref 6.5–8.1)

## 2022-05-07 LAB — LACTATE DEHYDROGENASE: LDH: 144 U/L (ref 98–192)

## 2022-05-08 ENCOUNTER — Inpatient Hospital Stay (HOSPITAL_BASED_OUTPATIENT_CLINIC_OR_DEPARTMENT_OTHER): Payer: Medicare HMO | Admitting: Hematology

## 2022-05-08 DIAGNOSIS — C9110 Chronic lymphocytic leukemia of B-cell type not having achieved remission: Secondary | ICD-10-CM | POA: Diagnosis not present

## 2022-05-08 DIAGNOSIS — D696 Thrombocytopenia, unspecified: Secondary | ICD-10-CM | POA: Diagnosis not present

## 2022-05-08 DIAGNOSIS — D693 Immune thrombocytopenic purpura: Secondary | ICD-10-CM | POA: Diagnosis not present

## 2022-05-08 NOTE — Progress Notes (Signed)
HEMATOLOGY/ONCOLOGY PHONE VISIT NOTE  Date of Service: 05/08/22    Patient Care Team: Brianna Infante, MD as PCP - General (Internal Medicine)  CHIEF COMPLAINTS/PURPOSE OF CONSULTATION:  Follow-up for continued evaluation and management of CLL  Oncologic History:  Oncology History  Breast cancer of upper-outer quadrant of left female breast ([place])  09/16/2010 Surgery   Left breast 1.5 cm invasive ductal carcinoma with calcifications, grade 1, perineural invasion was seen, DCIS, margins negative, 2 sentinel nodes negative, ER 99%, PR negative, Ki-67 79%, HER-2 negative, Oncotype 24, 16% ROR   10/25/2010 -  Anti-estrogen oral therapy   Arimidex 1 mg daily developed side effects and was held briefly and restarted   Lymphoma, small lymphocytic ([place])  11/16/2014 Initial Diagnosis   Lymphoma, small lymphocytic: Based on cervical lymph node biopsy CD5 positive CD10 negative   CLL (chronic lymphocytic leukemia) ([place])  12/12/2020 Cancer Staging   Staging form: Chronic Lymphocytic Leukemia / Small Lymphocytic Lymphoma, AJCC 8th Edition - Clinical stage from 12/12/2020: Modified Rai Stage IV (Modified Rai risk: High, Lymphocytosis: Present, Adenopathy: Present, Organomegaly: Present, Anemia: Present, Thrombocytopenia: Present) - Signed by [name], MD on 12/15/2020 Stage prefix: Post-therapy Response to neoadjuvant therapy: Unknown   12/15/2020 Initial Diagnosis   CLL (chronic lymphocytic leukemia) ([place])     HISTORY OF PRESENTING ILLNESS:  Please see previous note for details of initial presentation  INTERVAL HISTORY:  Brianna York is a wonderful 88 y.o. female who has been connected for continued evaluation and management of her CLL and ITP related thrombocytopenia.   Patient was last seen by me on 03/13/22 and continued to have some skin erosions/injuries over her posterior upper back and arms caused by scratching. She was otherwise doing well overall with no new medical  concerns.   I connected with PRENTICE York on 05/08/22 at  3:30 PM EST by telephone visit and verified that I am speaking with the correct person using two identifiers.   I discussed the limitations, risks, security and privacy concerns of performing an evaluation and management service by telemedicine and the availability of in-person appointments. I also discussed with the patient that there may be a patient responsible charge related to this service. The patient expressed understanding and agreed to proceed.   Other persons participating in the visit and their role in the encounter: patient's brother   Patient's location: home  Provider's location: [facility]   Chief Complaint: continued evaluation and management of her CLL and ITP related thrombocytopenia   Today, she complains that she has struggled with a lack of energy and frequently staying in bed. She reports that she as issues with eating due to nausea symptoms, but does not take any nausea medication and denies any acid reflux. However, her brother reports that her appetite has been normal. She reports that she drinks Coco-cola frequently but her water intake has been low. She has not had any issues tolerating her Acalabrutinib and has not experienced any bleeding issues or diarrhea.   MEDICAL HISTORY:  Past Medical History:  Diagnosis Date   Adenomatous colon polyp    tubular   Arthritis    osteo arthritis   Breast cancer ([place]) 02/16/2011   Diverticulosis    GERD (gastroesophageal reflux disease)    Hyperlipidemia    Leukemia ([facility])    Lymphoma ([place])    small lympocytic   Osteopenia     SURGICAL HISTORY: Past Surgical History:  Procedure Laterality Date   bartholian cyst  1986  BREAST LUMPECTOMY Left 09-16-2010   CATARACT EXTRACTION, BILATERAL     CESAREAN SECTION  1958, [place] ARTHROSCOPY Right 2010   LYMPH GLAND EXCISION Right 11/16/2014   Procedure: EXCISION AND BIOPSY OF RIGHT CERVICAL LYMPH NODE;   Surgeon: Jackolyn Confer, MD;  Location: [place];  Service: General;  Laterality: Right;   TONSILLECTOMY  1940    SOCIAL HISTORY: Social History   Socioeconomic History   Marital status: Widowed    Spouse name: Not on file   Number of children: 5   Years of education: Not on file   Highest education level: Not on file  Occupational History   Occupation: retired  Tobacco Use   Smoking status: Former    Types: Cigarettes    Quit date: 1989    Years since quitting: 35.1   Smokeless tobacco: Never   Tobacco comments:    Quit smoking 27 years ago  Scientific laboratory technician Use: Never used  Substance and Sexual Activity   Alcohol use: Yes    Comment: occasional glass wine   Drug use: No   Sexual activity: Not Currently  Other Topics Concern   Not on file  Social History Narrative   Not on file   Social Determinants of Health   Financial Resource Strain: Not on file  Food Insecurity: Not on file  Transportation Needs: Not on file  Physical Activity: Not on file  Stress: Not on file  Social Connections: Not on file  Intimate Partner Violence: Not on file    FAMILY HISTORY: Family History  Problem Relation Age of Onset   Colon cancer Mother 70   Colon cancer Maternal Aunt        had colon sugery   Leukemia Father    Diabetes Brother    Diabetes Brother     ALLERGIES:  is allergic to ciprofloxacin, loratadine-pseudoephedrine er, penicillins, and tolterodine tartrate.  MEDICATIONS:  Current Outpatient Medications  Medication Sig Dispense Refill   acalabrutinib maleate (CALQUENCE) 100 MG tablet Take 1 tablet (100 mg total) by mouth daily. 30 tablet 1   alendronate (FOSAMAX) 70 MG tablet Take 70 mg by mouth once a week. Take with a full glass of water on an empty stomach.     atorvastatin (LIPITOR) 10 MG tablet Take 10 mg by mouth daily.     cetirizine (ZYRTEC) 10 MG tablet TAKE ONE TABLET AT BEDTIME 30 tablet 0   Cyanocobalamin (B-12) 1000 MCG SUBL  Place 1,000 mcg under the tongue daily. 30 tablet 11   eltrombopag (PROMACTA) 25 MG tablet Take 1 tablet (25 mg total) by mouth daily. Take on an empty stomach, 1 hour before a meal or 2 hours after. 30 tablet 2   valACYclovir (VALTREX) 500 MG tablet TAKE ONE TABLET BY MOUTH TWICE DAILY FOR 7 DAYS 14 tablet 0   Vitamin D, Ergocalciferol, (DRISDOL) 50000 UNITS CAPS capsule Take 50,000 Units by mouth every 7 (seven) days.     No current facility-administered medications for this visit.    REVIEW OF SYSTEMS:    10 Point review of Systems was done is negative except as noted above.   PHYSICAL EXAMINATION: TELEPHONE VISIT .There were no vitals taken for this visit.   LABORATORY DATA:  I have reviewed the data as listed  .    Latest Ref Rng & Units 05/07/2022   12:42 PM 03/13/2022   11:21 AM 12/22/2021    1:22 PM  CBC  WBC  4.0 - 10.5 K/uL 12.8  9.5  13.3   Hemoglobin 12.0 - 15.0 g/dL 11.9  11.1  11.6   Hematocrit 36.0 - 46.0 % 36.6  34.9  36.8   Platelets 150 - 400 K/uL 244  133  117     .    Latest Ref Rng & Units 05/07/2022   12:42 PM 03/13/2022   11:21 AM 12/22/2021    1:22 PM  CMP  Glucose 70 - 99 mg/dL 91  119  87   BUN 8 - 23 mg/dL '9  13  20   '$ Creatinine 0.44 - 1.00 mg/dL 0.80  0.86  1.02   Sodium 135 - 145 mmol/L 132  138  138   Potassium 3.5 - 5.1 mmol/L 4.3  4.2  4.6   Chloride 98 - 111 mmol/L 98  106  104   CO2 22 - 32 mmol/L '26  26  28   '$ Calcium 8.9 - 10.3 mg/dL 8.9  9.2  9.2   Total Protein 6.5 - 8.1 g/dL 7.4  7.6  8.0   Total Bilirubin 0.3 - 1.2 mg/dL 0.6  0.7  0.6   Alkaline Phos 38 - 126 U/L 69  50  57   AST 15 - 41 U/L '18  19  21   '$ ALT 0 - 44 U/L '8  10  11    '$ Lab Results  Component Value Date   LDH 144 05/07/2022     Surgical Pathology  CASE: [id number]  PATIENT: Brianna York  Flow Pathology Report   Clinical history: right orbital mass   DIAGNOSIS:   - Monoclonal B-cell population identified, see comment.   COMMENT:   There is a  monoclonal population of B-cells with expression of CD5 and  CD200. The phenotype is consistent with the patient's history of small  lymphocytic lymphoma.   02/21/18 Left Lacrimal sac biopsy:    12/18/14 Cytogenetics:    12/04/14 Right cervical lymph node biopsy:   06/27/18 FISH CLL Prognostic Panel:    RADIOGRAPHIC STUDIES: I have personally reviewed the radiological images as listed and agreed with the findings in the report. No results found.  ASSESSMENT & PLAN:   88 y.o. female with  1. Chronic Lymphocytic Leukemia 11/16/14 Right cervical lymph node biopsy revealed SLL/CLL 12/18/14 Cytogenetics revealed 59% of cells with gain of CEP12 and 5% of cells with loss of ATM 02/21/18 Right lacrimal gland revealed lymphoid infiltrates 06/27/18 FISH CLL Prognostic Panel revealed 49.33% of cells with Trisomy 12, and only 2% of cells with 17p mutation  08/03/18 PET/CT revealed "Exam positive for FDG avid adenopathy within the neck, chest, abdomen and pelvis compatible with the clinical history of chronic lymphocytic leukemia. 2. Splenomegaly (about 13cm) with mild increased radiotracer uptake throughout the spleen (above blood pool activity)".   2. History of Invasive Ductal Carcinoma 09/16/10 Left breast 1.5cm IDC with calcifications, grade 1, perineural invasion. DCIS margins negative, 2 sentinel nodes negative, ER 99%, PR negative, Ki-67 at 79%, HER-2 negative, Oncotype 24, 16% ROR Continues on '1mg'$  Anastrozole daily, began on 10/25/10. Last available Mammogram on 12/31/16 negative  3.  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   -Right orbital biopsy from 11/23/2019 showed CLL/SLL.  -Received radiation treatment with temporary improvement.  4.  Leukocytosis/Anemia/Thrombocytopenia: -Secondary to CLL -Evidence of splenomegaly and possible element of ITP  5. Likely Dementia -Patient has significant cognitive deficits. Brother has been actively caring for her. She  notes she is comfortable with her current living  arrangement and does not want to move closer to her kids.  PLAN:   -Discussed lab results from 05/07/22 with patient. CBC showed WBC of 12.8K, hemoglobin improved to 11.9, and platelets improved to 244K. -CMP stable with mild dehydration: sodium level of 132 -LDH improved to 144 -anemia is resolving with treatment of CLL -platelets normal despite lower-dose Promacta, 5 days a week -Further reduce Promacta dose to 3 days a week: Monday, Wednesday, and Friday -recommended patient to continue to eat a balanced diet and to stay hydrated by drinking more water -Patient has no bleeding issues or other notable toxicities from her acalabrutinib -Will continue acalabrutinib at 100 mg p.o. daily -high protein, high caffeine options discussed with patient. Discussed that frequent consumption of Coco-cola may be associated with acid reflux -Return to clinic in 6-8 weeks with labs   FOLLOW-UP: RTC with Dr Irene Limbo as per scheduled appointment on 06/03/2022.  The total time spent in the appointment was *** minutes* .  All of the patient's questions were answered with apparent satisfaction. The patient knows to call the clinic with any problems, questions or concerns.   Sullivan Lone MD MS AAHIVMS [facility] Hematology/Oncology Physician [facility]  .*Total Encounter Time as defined by the Centers for Medicare and Medicaid Services includes, in addition to the face-to-face time of a patient visit (documented in the note above) non-face-to-face time: obtaining and reviewing outside history, ordering and reviewing medications, tests or procedures, care coordination (communications with other health care professionals or caregivers) and documentation in the medical record.   I,Mitra Faeizi,acting as a Education administrator for Sullivan Lone, MD.,have documented all relevant documentation on the behalf of Sullivan Lone, MD,as directed by  Sullivan Lone, MD while in the  presence of Sullivan Lone, MD.  ***

## 2022-05-13 ENCOUNTER — Other Ambulatory Visit: Payer: Self-pay

## 2022-05-13 DIAGNOSIS — C9110 Chronic lymphocytic leukemia of B-cell type not having achieved remission: Secondary | ICD-10-CM

## 2022-05-13 MED ORDER — CALQUENCE 100 MG PO TABS: 100.0000 mg | ORAL_TABLET | Freq: Every day | ORAL | 1 refills | Status: DC

## 2022-05-14 ENCOUNTER — Encounter: Payer: Self-pay | Admitting: Hematology

## 2022-06-03 ENCOUNTER — Inpatient Hospital Stay: Payer: Medicare HMO | Admitting: Hematology

## 2022-06-03 ENCOUNTER — Telehealth: Payer: Self-pay | Admitting: Hematology

## 2022-06-03 ENCOUNTER — Inpatient Hospital Stay: Payer: Medicare HMO

## 2022-06-03 NOTE — Telephone Encounter (Signed)
Patient's brother called to reschedule appointment for 3/20 due to patient feeling ill. Patient rescheduled and will be notified. Also requested all AVS from previous visits starting back to first diagnosis. Patient needs this information for ongoing legal issues not involving [place].

## 2022-06-03 NOTE — Progress Notes (Incomplete)
HEMATOLOGY/ONCOLOGY CLINIC NOTE  Date of Service: 06/03/22    Patient Care Team: [name], MD as PCP - General (Internal Medicine)  CHIEF COMPLAINTS/PURPOSE OF CONSULTATION:  Follow-up for continued evaluation and management of CLL  Oncologic History:  Oncology History  Breast cancer of upper-outer quadrant of left female breast ([place])  09/16/2010 Surgery   Left breast 1.5 cm invasive ductal carcinoma with calcifications, grade 1, perineural invasion was seen, DCIS, margins negative, 2 sentinel nodes negative, ER 99%, PR negative, Ki-67 79%, HER-2 negative, Oncotype 24, 16% ROR   10/25/2010 -  Anti-estrogen oral therapy   Arimidex 1 mg daily developed side effects and was held briefly and restarted   Lymphoma, small lymphocytic ([place])  11/16/2014 Initial Diagnosis   Lymphoma, small lymphocytic: Based on cervical lymph node biopsy CD5 positive CD10 negative   CLL (chronic lymphocytic leukemia) ([place])  12/12/2020 Cancer Staging   Staging form: Chronic Lymphocytic Leukemia / Small Lymphocytic Lymphoma, AJCC 8th Edition - Clinical stage from 12/12/2020: Modified Rai Stage IV (Modified Rai risk: High, Lymphocytosis: Present, Adenopathy: Present, Organomegaly: Present, Anemia: Present, Thrombocytopenia: Present) - Signed by [name], MD on 12/15/2020 Stage prefix: Post-therapy Response to neoadjuvant therapy: Unknown   12/15/2020 Initial Diagnosis   CLL (chronic lymphocytic leukemia) ([place])     HISTORY OF PRESENTING ILLNESS:  Please see previous note for details of initial presentation  INTERVAL HISTORY:  Brianna York is a wonderful 88 y.o. female who has been connected for continued evaluation and management of her CLL and ITP related thrombocytopenia.   Patient was last connected with me on 05/08/2022 via telephone visit and she complained of lack of energy, nausea, and poor p.o. intake.  Today,   -Discussed lab results on 06/03/2022 with patient in detail. CBC  showed WBC of ***K, hemoglobin of ***, and platelets of ***K. -   She has not had any issues tolerating her Acalabrutinib    MEDICAL HISTORY:  Past Medical History:  Diagnosis Date   Adenomatous colon polyp    tubular   Arthritis    osteo arthritis   Breast cancer ([place]) 02/16/2011   Diverticulosis    GERD (gastroesophageal reflux disease)    Hyperlipidemia    Leukemia ([place])    Lymphoma ([place])    small lympocytic   Osteopenia     SURGICAL HISTORY: Past Surgical History:  Procedure Laterality Date   bartholian cyst  1986   BREAST LUMPECTOMY Left 09-16-2010   CATARACT EXTRACTION, [place] ARTHROSCOPY Right 2010   LYMPH GLAND EXCISION Right 11/16/2014   Procedure: EXCISION AND BIOPSY OF RIGHT CERVICAL LYMPH NODE;  Surgeon: Jackolyn Confer, MD;  Location: [facility];  Service: General;  Laterality: Right;   TONSILLECTOMY  1940    SOCIAL HISTORY: Social History   Socioeconomic History   Marital status: Widowed    Spouse name: Not on file   Number of children: 5   Years of education: Not on file   Highest education level: Not on file  Occupational History   Occupation: retired  Tobacco Use   Smoking status: Former    Types: Cigarettes    Quit date: 1989    Years since quitting: 35.2   Smokeless tobacco: Never   Tobacco comments:    Quit smoking 27 years ago  Vaping Use   Vaping Use: Never used  Substance and Sexual Activity   Alcohol use: Yes  Comment: occasional glass wine   Drug use: No   Sexual activity: Not Currently  Other Topics Concern   Not on file  Social History Narrative   Not on file   Social Determinants of Health   Financial Resource Strain: Not on file  Food Insecurity: Not on file  Transportation Needs: Not on file  Physical Activity: Not on file  Stress: Not on file  Social Connections: Not on file  Intimate Partner Violence: Not on file    FAMILY HISTORY: Family History   Problem Relation Age of Onset   Colon cancer Mother 57   Colon cancer Maternal Aunt        had colon sugery   Leukemia Father    Diabetes Brother    Diabetes Brother     ALLERGIES:  is allergic to ciprofloxacin, loratadine-pseudoephedrine er, penicillins, and tolterodine tartrate.  MEDICATIONS:  Current Outpatient Medications  Medication Sig Dispense Refill   acalabrutinib maleate (CALQUENCE) 100 MG tablet Take 1 tablet (100 mg total) by mouth daily. 30 tablet 1   alendronate (FOSAMAX) 70 MG tablet Take 70 mg by mouth once a week. Take with a full glass of water on an empty stomach.     atorvastatin (LIPITOR) 10 MG tablet Take 10 mg by mouth daily.     cetirizine (ZYRTEC) 10 MG tablet TAKE ONE TABLET AT BEDTIME 30 tablet 0   Cyanocobalamin (B-12) 1000 MCG SUBL Place 1,000 mcg under the tongue daily. 30 tablet 11   eltrombopag (PROMACTA) 25 MG tablet Take 1 tablet (25 mg total) by mouth daily. Take on an empty stomach, 1 hour before a meal or 2 hours after. 30 tablet 2   valACYclovir (VALTREX) 500 MG tablet TAKE ONE TABLET BY MOUTH TWICE DAILY FOR 7 DAYS 14 tablet 0   Vitamin D, Ergocalciferol, (DRISDOL) 50000 UNITS CAPS capsule Take 50,000 Units by mouth every 7 (seven) days.     No current facility-administered medications for this visit.    REVIEW OF SYSTEMS:    10 Point review of Systems was done is negative except as noted above.   PHYSICAL EXAMINATION:  GENERAL:alert, in no acute distress and comfortable SKIN: no acute rashes, no significant lesions EYES: conjunctiva are pink and non-injected, sclera anicteric OROPHARYNX: MMM, no exudates, no oropharyngeal erythema or ulceration NECK: supple, no JVD LYMPH:  no palpable lymphadenopathy in the cervical, axillary or inguinal regions LUNGS: clear to auscultation b/l with normal respiratory effort HEART: regular rate & rhythm ABDOMEN:  normoactive bowel sounds , non tender, not distended. Extremity: no pedal edema PSYCH:  alert & oriented x 3 with fluent speech NEURO: no focal motor/sensory deficits     LABORATORY DATA:  I have reviewed the data as listed  .    Latest Ref Rng & Units 05/07/2022   12:42 PM 03/13/2022   11:21 AM 12/22/2021    1:22 PM  CBC  WBC 4.0 - 10.5 K/uL 12.8  9.5  13.3   Hemoglobin 12.0 - 15.0 g/dL 11.9  11.1  11.6   Hematocrit 36.0 - 46.0 % 36.6  34.9  36.8   Platelets 150 - 400 K/uL 244  133  117     .    Latest Ref Rng & Units 05/07/2022   12:42 PM 03/13/2022   11:21 AM 12/22/2021    1:22 PM  CMP  Glucose 70 - 99 mg/dL 91  119  87   BUN 8 - 23 mg/dL 9  13  20  Creatinine 0.44 - 1.00 mg/dL 0.80  0.86  1.02   Sodium 135 - 145 mmol/L 132  138  138   Potassium 3.5 - 5.1 mmol/L 4.3  4.2  4.6   Chloride 98 - 111 mmol/L 98  106  104   CO2 22 - 32 mmol/L 26  26  28    Calcium 8.9 - 10.3 mg/dL 8.9  9.2  9.2   Total Protein 6.5 - 8.1 g/dL 7.4  7.6  8.0   Total Bilirubin 0.3 - 1.2 mg/dL 0.6  0.7  0.6   Alkaline Phos 38 - 126 U/L 69  50  57   AST 15 - 41 U/L 18  19  21    ALT 0 - 44 U/L 8  10  11     Lab Results  Component Value Date   LDH 144 05/07/2022     Surgical Pathology  CASE: [id number]  PATIENT: Brianna York  Flow Pathology Report   Clinical history: right orbital mass   DIAGNOSIS:   - Monoclonal B-cell population identified, see comment.   COMMENT:   There is a monoclonal population of B-cells with expression of CD5 and  CD200. The phenotype is consistent with the patient's history of small  lymphocytic lymphoma.   02/21/18 Left Lacrimal sac biopsy:    12/18/14 Cytogenetics:    12/04/14 Right cervical lymph node biopsy:   06/27/18 FISH CLL Prognostic Panel:    RADIOGRAPHIC STUDIES: I have personally reviewed the radiological images as listed and agreed with the findings in the report. No results found.  ASSESSMENT & PLAN:   88 y.o. female with  1. Chronic Lymphocytic Leukemia 11/16/14 Right cervical lymph node biopsy revealed  SLL/CLL 12/18/14 Cytogenetics revealed 59% of cells with gain of CEP12 and 5% of cells with loss of ATM 02/21/18 Right lacrimal gland revealed lymphoid infiltrates 06/27/18 FISH CLL Prognostic Panel revealed 49.33% of cells with Trisomy 12, and only 2% of cells with 17p mutation  08/03/18 PET/CT revealed "Exam positive for FDG avid adenopathy within the neck, chest, abdomen and pelvis compatible with the clinical history of chronic lymphocytic leukemia. 2. Splenomegaly (about 13cm) with mild increased radiotracer uptake throughout the spleen (above blood pool activity)".   2. History of Invasive Ductal Carcinoma 09/16/10 Left breast 1.5cm IDC with calcifications, grade 1, perineural invasion. DCIS margins negative, 2 sentinel nodes negative, ER 99%, PR negative, Ki-67 at 79%, HER-2 negative, Oncotype 24, 16% ROR Continues on 1mg  Anastrozole daily, began on 10/25/10. Last available Mammogram on 12/31/16 negative  3.  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   -Right orbital biopsy from 11/23/2019 showed CLL/SLL.  -Received radiation treatment with temporary improvement.  4.  Leukocytosis/Anemia/Thrombocytopenia: -Secondary to CLL -Evidence of splenomegaly and possible element of ITP  5. Likely Dementia -Patient has significant cognitive deficits. Brother has been actively caring for her. She notes she is comfortable with her current living arrangement and does not want to move closer to her kids.  PLAN:   -Discussed lab results from 05/07/22 with patient. CBC showed WBC of 12.8K, hemoglobin improved to 11.9, and platelets improved to 244K. -CMP stable with mild dehydration: sodium level of 132 -LDH improved to 144 -anemia is resolving with treatment of CLL -platelets normal despite lower-dose Promacta, 5 days a week -Further reduce Promacta dose to 3 days a week: Monday, Wednesday, and Friday -recommended patient to continue to eat a balanced diet and to stay hydrated by  drinking more water -Patient has no bleeding issues or  other notable toxicities from her acalabrutinib -Will continue acalabrutinib at 100 mg p.o. daily -high protein, high caloric options discussed with patient. Discussed that frequent consumption of [employer] may be associated with acid reflux -Return to clinic in 6-8 weeks with labs   FOLLOW-UP: ***  The total time spent in the appointment was *** minutes* .  All of the patient's questions were answered with apparent satisfaction. The patient knows to call the clinic with any problems, questions or concerns.   Sullivan Lone MD MS AAHIVMS [facility] Hematology/Oncology Physician [facility]  .*Total Encounter Time as defined by the Centers for Medicare and Medicaid Services includes, in addition to the face-to-face time of a patient visit (documented in the note above) non-face-to-face time: obtaining and reviewing outside history, ordering and reviewing medications, tests or procedures, care coordination (communications with other health care professionals or caregivers) and documentation in the medical record.    I,Mitra Faeizi,acting as a Education administrator for Sullivan Lone, MD.,have documented all relevant documentation on the behalf of Sullivan Lone, MD,as directed by  Sullivan Lone, MD while in the presence of Sullivan Lone, MD.  ***

## 2022-06-24 ENCOUNTER — Telehealth: Payer: Self-pay

## 2022-06-24 ENCOUNTER — Other Ambulatory Visit: Payer: Self-pay

## 2022-06-24 ENCOUNTER — Encounter: Payer: Self-pay | Admitting: Hematology

## 2022-06-24 DIAGNOSIS — C9110 Chronic lymphocytic leukemia of B-cell type not having achieved remission: Secondary | ICD-10-CM

## 2022-06-24 DIAGNOSIS — D693 Immune thrombocytopenic purpura: Secondary | ICD-10-CM

## 2022-06-24 MED ORDER — ELTROMBOPAG OLAMINE 25 MG PO TABS: 25.0000 mg | ORAL_TABLET | Freq: Every day | ORAL | 2 refills | Status: DC

## 2022-06-24 MED ORDER — CALQUENCE 100 MG PO TABS: 100.0000 mg | ORAL_TABLET | Freq: Every day | ORAL | 1 refills | Status: DC

## 2022-06-24 NOTE — Telephone Encounter (Signed)
Contacted pt's daughter to discuss her concerns regarding pt's "sores" on her skin. PT gets these open places when she does not take her medication. Pt's daughter unsure if pt is taking medication daily as directed. Pt's calquence is supposed to be taken daily and is over due to be refilled. Pt's promacta due to be refilled as well. Pt has canceled several appointments lately. Pt's daughter does not live with her, pt's brother is supposed to be taking care of pt. Pt's daughter will discuss med adherence with pt and her brother and the importance of keeping appointments.

## 2022-06-28 ENCOUNTER — Inpatient Hospital Stay (HOSPITAL_COMMUNITY)
Admission: EM | Admit: 2022-06-28 | Discharge: 2022-07-01 | DRG: 872 | Disposition: A | Discharge status: Home Health | Payer: Medicare HMO | Attending: Internal Medicine | Admitting: Internal Medicine

## 2022-06-28 ENCOUNTER — Encounter (HOSPITAL_COMMUNITY): Payer: Self-pay

## 2022-06-28 ENCOUNTER — Emergency Department (HOSPITAL_COMMUNITY): Payer: Medicare HMO

## 2022-06-28 ENCOUNTER — Other Ambulatory Visit: Payer: Self-pay

## 2022-06-28 DIAGNOSIS — Z170 Estrogen receptor positive status [ER+]: Secondary | ICD-10-CM | POA: Diagnosis not present

## 2022-06-28 DIAGNOSIS — E785 Hyperlipidemia, unspecified: Secondary | ICD-10-CM | POA: Diagnosis present

## 2022-06-28 DIAGNOSIS — Z87891 Personal history of nicotine dependence: Secondary | ICD-10-CM

## 2022-06-28 DIAGNOSIS — Z79899 Other long term (current) drug therapy: Secondary | ICD-10-CM | POA: Diagnosis not present

## 2022-06-28 DIAGNOSIS — C9110 Chronic lymphocytic leukemia of B-cell type not having achieved remission: Secondary | ICD-10-CM | POA: Diagnosis present

## 2022-06-28 DIAGNOSIS — L03113 Cellulitis of right upper limb: Secondary | ICD-10-CM | POA: Diagnosis present

## 2022-06-28 DIAGNOSIS — Z833 Family history of diabetes mellitus: Secondary | ICD-10-CM | POA: Diagnosis not present

## 2022-06-28 DIAGNOSIS — Z853 Personal history of malignant neoplasm of breast: Secondary | ICD-10-CM | POA: Diagnosis not present

## 2022-06-28 DIAGNOSIS — Z888 Allergy status to other drugs, medicaments and biological substances status: Secondary | ICD-10-CM | POA: Diagnosis not present

## 2022-06-28 DIAGNOSIS — N3000 Acute cystitis without hematuria: Secondary | ICD-10-CM | POA: Diagnosis not present

## 2022-06-28 DIAGNOSIS — Z1152 Encounter for screening for COVID-19: Secondary | ICD-10-CM | POA: Diagnosis not present

## 2022-06-28 DIAGNOSIS — C50412 Malignant neoplasm of upper-outer quadrant of left female breast: Secondary | ICD-10-CM | POA: Diagnosis not present

## 2022-06-28 DIAGNOSIS — Z880 Allergy status to penicillin: Secondary | ICD-10-CM | POA: Diagnosis not present

## 2022-06-28 DIAGNOSIS — R6520 Severe sepsis without septic shock: Secondary | ICD-10-CM

## 2022-06-28 DIAGNOSIS — N390 Urinary tract infection, site not specified: Secondary | ICD-10-CM | POA: Diagnosis present

## 2022-06-28 DIAGNOSIS — Z806 Family history of leukemia: Secondary | ICD-10-CM | POA: Diagnosis not present

## 2022-06-28 DIAGNOSIS — K219 Gastro-esophageal reflux disease without esophagitis: Secondary | ICD-10-CM | POA: Diagnosis present

## 2022-06-28 DIAGNOSIS — Z800 Family history of malignant neoplasm of digestive organs: Secondary | ICD-10-CM | POA: Diagnosis not present

## 2022-06-28 DIAGNOSIS — A419 Sepsis, unspecified organism: Principal | ICD-10-CM | POA: Diagnosis present

## 2022-06-28 DIAGNOSIS — N61 Mastitis without abscess: Principal | ICD-10-CM

## 2022-06-28 DIAGNOSIS — F0390 Unspecified dementia without behavioral disturbance: Secondary | ICD-10-CM | POA: Diagnosis present

## 2022-06-28 DIAGNOSIS — D693 Immune thrombocytopenic purpura: Secondary | ICD-10-CM | POA: Diagnosis present

## 2022-06-28 DIAGNOSIS — Z881 Allergy status to other antibiotic agents status: Secondary | ICD-10-CM

## 2022-06-28 DIAGNOSIS — Z7983 Long term (current) use of bisphosphonates: Secondary | ICD-10-CM

## 2022-06-28 LAB — COMPREHENSIVE METABOLIC PANEL
ALT: 10 U/L (ref 0–44)
AST: 18 U/L (ref 15–41)
Albumin: 3.1 g/dL — ABNORMAL LOW (ref 3.5–5.0)
Alkaline Phosphatase: 55 U/L (ref 38–126)
Anion gap: 13 (ref 5–15)
BUN: 19 mg/dL (ref 8–23)
CO2: 23 mmol/L (ref 22–32)
Calcium: 8.8 mg/dL — ABNORMAL LOW (ref 8.9–10.3)
Chloride: 95 mmol/L — ABNORMAL LOW (ref 98–111)
Creatinine, Ser: 0.81 mg/dL (ref 0.44–1.00)
GFR, Estimated: >60 mL/min (ref >60)
Glucose, Bld: 119 mg/dL — ABNORMAL HIGH (ref 70–99)
Potassium: 3.6 mmol/L (ref 3.5–5.1)
Sodium: 131 mmol/L — ABNORMAL LOW (ref 135–145)
Total Bilirubin: 1.1 mg/dL (ref 0.3–1.2)
Total Protein: 7.3 g/dL (ref 6.5–8.1)

## 2022-06-28 LAB — URINALYSIS, W/ REFLEX TO CULTURE (INFECTION SUSPECTED)
Bilirubin Urine: NEGATIVE
Glucose, UA: NEGATIVE mg/dL
Ketones, ur: 20 mg/dL — AB
Nitrite: POSITIVE — AB
Protein, ur: 100 mg/dL — AB
Specific Gravity, Urine: 1.023 (ref 1.005–1.030)
WBC, UA: >50 WBC/hpf (ref 0–5)
pH: 7 (ref 5.0–8.0)

## 2022-06-28 LAB — CULTURE, BLOOD (ROUTINE X 2)
Special Requests: ADEQUATE
Special Requests: ADEQUATE

## 2022-06-28 LAB — RESP PANEL BY RT-PCR (RSV, FLU A&B, COVID)  RVPGX2
Influenza A by PCR: NEGATIVE
Influenza B by PCR: NEGATIVE
Resp Syncytial Virus by PCR: NEGATIVE
SARS Coronavirus 2 by RT PCR: NEGATIVE

## 2022-06-28 LAB — CBC WITH DIFFERENTIAL/PLATELET
Abs Immature Granulocytes: 0.04 10*3/uL (ref 0.00–0.07)
Basophils Absolute: 0 10*3/uL (ref 0.0–0.1)
Basophils Relative: 0 %
Eosinophils Absolute: 0 10*3/uL (ref 0.0–0.5)
Eosinophils Relative: 0 %
HCT: 36.7 % (ref 36.0–46.0)
Hemoglobin: 11.6 g/dL — ABNORMAL LOW (ref 12.0–15.0)
Immature Granulocytes: 0 %
Lymphocytes Relative: 61 %
Lymphs Abs: 8.7 10*3/uL — ABNORMAL HIGH (ref 0.7–4.0)
MCH: 25.8 pg — ABNORMAL LOW (ref 26.0–34.0)
MCHC: 31.6 g/dL (ref 30.0–36.0)
MCV: 81.7 fL (ref 80.0–100.0)
Monocytes Absolute: 0.7 10*3/uL (ref 0.1–1.0)
Monocytes Relative: 5 %
Neutro Abs: 4.9 10*3/uL (ref 1.7–7.7)
Neutrophils Relative %: 34 %
Platelets: 293 10*3/uL (ref 150–400)
RBC: 4.49 MIL/uL (ref 3.87–5.11)
RDW: 15.1 % (ref 11.5–15.5)
WBC Morphology: ABNORMAL
WBC: 14.2 10*3/uL — ABNORMAL HIGH (ref 4.0–10.5)
nRBC: 0 % (ref 0.0–0.2)

## 2022-06-28 LAB — PROTIME-INR
INR: 1.2 (ref 0.8–1.2)
Prothrombin Time: 15 seconds (ref 11.4–15.2)

## 2022-06-28 LAB — LACTIC ACID, PLASMA: Lactic Acid, Venous: 1.2 mmol/L (ref 0.5–1.9)

## 2022-06-28 LAB — APTT: aPTT: 28 seconds (ref 24–36)

## 2022-06-28 MED ORDER — ACALABRUTINIB MALEATE 100 MG PO TABS
100.0000 mg | ORAL_TABLET | Freq: Every day | ORAL | Status: DC
  Administered 2022-06-29 – 2022-07-01 (×3): 100 mg via ORAL

## 2022-06-28 MED ORDER — SODIUM CHLORIDE 0.9 % IV SOLN
2.0000 g | INTRAVENOUS | Status: DC
  Administered 2022-06-28 – 2022-06-30 (×3): 2 g via INTRAVENOUS
  Filled 2022-06-28 (×3): qty 20

## 2022-06-28 MED ORDER — SODIUM CHLORIDE 0.9 % IV SOLN: INTRAVENOUS | Status: AC

## 2022-06-28 MED ORDER — VANCOMYCIN HCL IN DEXTROSE 1-5 GM/200ML-% IV SOLN
1000.0000 mg | Freq: Once | INTRAVENOUS | Status: AC
  Administered 2022-06-28: 1000 mg via INTRAVENOUS
  Filled 2022-06-28: qty 200

## 2022-06-28 MED ORDER — ENOXAPARIN SODIUM 30 MG/0.3ML IJ SOSY
30.0000 mg | PREFILLED_SYRINGE | INTRAMUSCULAR | Status: DC
  Administered 2022-06-29: 30 mg via SUBCUTANEOUS
  Filled 2022-06-28: qty 0.3

## 2022-06-28 MED ORDER — LEVOFLOXACIN IN D5W 750 MG/150ML IV SOLN
750.0000 mg | Freq: Once | INTRAVENOUS | Status: DC
  Filled 2022-06-28: qty 150

## 2022-06-28 MED ORDER — ONDANSETRON HCL 4 MG PO TABS: 4.0000 mg | ORAL_TABLET | Freq: Four times a day (QID) | ORAL | Status: DC | PRN

## 2022-06-28 MED ORDER — POTASSIUM CHLORIDE 20 MEQ PO PACK
40.0000 meq | PACK | Freq: Once | ORAL | Status: AC
  Administered 2022-06-28: 40 meq via ORAL
  Filled 2022-06-28: qty 2

## 2022-06-28 MED ORDER — ACETAMINOPHEN 650 MG RE SUPP: 650.0000 mg | Freq: Four times a day (QID) | RECTAL | Status: DC | PRN

## 2022-06-28 MED ORDER — ACETAMINOPHEN 325 MG PO TABS: 650.0000 mg | ORAL_TABLET | Freq: Four times a day (QID) | ORAL | Status: DC | PRN

## 2022-06-28 MED ORDER — ELTROMBOPAG OLAMINE 25 MG PO TABS
25.0000 mg | ORAL_TABLET | Freq: Every day | ORAL | Status: DC
  Administered 2022-06-29: 25 mg via ORAL
  Filled 2022-06-28 (×3): qty 1

## 2022-06-28 MED ORDER — POLYETHYLENE GLYCOL 3350 17 G PO PACK: 17.0000 g | PACK | Freq: Every day | ORAL | Status: DC | PRN

## 2022-06-28 MED ORDER — HYDROCODONE-ACETAMINOPHEN 5-325 MG PO TABS: 1.0000 | ORAL_TABLET | Freq: Three times a day (TID) | ORAL | Status: DC | PRN

## 2022-06-28 MED ORDER — VANCOMYCIN HCL 750 MG/150ML IV SOLN
750.0000 mg | INTRAVENOUS | Status: DC
  Administered 2022-06-30: 750 mg via INTRAVENOUS
  Filled 2022-06-28: qty 150

## 2022-06-28 MED ORDER — ONDANSETRON HCL 4 MG/2ML IJ SOLN: 4.0000 mg | Freq: Four times a day (QID) | INTRAMUSCULAR | Status: DC | PRN

## 2022-06-28 NOTE — Assessment & Plan Note (Addendum)
Cellulitis involving skin of right shoulder extending towards right breast, and right upper scapula posteriorly.  Open superficial wounds present.  No purulent drainage.  Leukocytosis 14.2, tachycardia heart rate 87-103.  Stable blood pressure.  Rules in for sepsis.  Normal lactic acid 1.2. -Continue IV ceftriaxone and IV vancomycin -Follow-up blood cultures -Reduce normal saline rate to N/s 75cc/hr x 20hrs -Her medication Calquence helps with her chronic rash, she ran out but this has just been reviewed, family encouraged to bring this medication to the hospital

## 2022-06-28 NOTE — Assessment & Plan Note (Addendum)
--   Continue outpatient follow-up with Dr. Candise Che.  -Resume Promacta, acalabrutinib (this med helps with her rash when she is compliant with usage.)

## 2022-06-28 NOTE — ED Triage Notes (Signed)
PATIENT FROM HOME FOR RASH TO HER R SHOULDER AND BOTTOM THAT HAS BEEN PRESENT FOR A COUPLE OF WEEKS. PATIENT REPORTS THE RASH IS UNCOMFORTABLE AND ITCHY. PATIENT HAS HISTORY OF LEUKEMIA. UPON ARRIVAL TO ER, PATIENT IS ALERT AND ORIENTED. BRIGHT RED LESIONS NOTED TO THE ANTERIOR AND POSTERIOR R SHOULDER, POSTERIOR L SHOULDER, AND SACRAL AREA. LOCALIZED REDNESS AND WARMTH NOTED TO THE R BREAST SURROUNDING LESIONS. AIRWAY PATENT AND INTACT; IN NO APPARENT DISTRESS

## 2022-06-28 NOTE — Progress Notes (Addendum)
Pharmacy Antibiotic Note  Brianna York is a 88 y.o. female admitted on 06/28/2022 with cellulitis and skin soft tissue infection .  Pharmacy has been consulted for vanc dosing.  Pt presented with rash down shoulder and torso area. She has a reported hx of PCN allergy with swelling. Some of her reactions to other drugs are questionable. Levaquin was ordered per consult but cephalosporins can be used if reactions are not clearly defined. Ceftriaxone has a <1% crossreactivity to PCN so we will use that instead.   Plan: Vanc 1g x1 then 750mg  IV q48>>AUC 487, scr 1 Ceftriaxone 2g IV q24 Levels if needed  Height: 4\' 11"  (149.9 cm) Weight: 40.8 kg (90 lb) IBW/kg (Calculated) : 43.2  Temp (24hrs), Avg:98.3 F (36.8 C), Min:98.3 F (36.8 C), Max:98.3 F (36.8 C)  No results for input(s): "WBC", "CREATININE", "LATICACIDVEN", "VANCOTROUGH", "VANCOPEAK", "VANCORANDOM", "GENTTROUGH", "GENTPEAK", "GENTRANDOM", "TOBRATROUGH", "TOBRAPEAK", "TOBRARND", "AMIKACINPEAK", "AMIKACINTROU", "AMIKACIN" in the last 168 hours.  CrCl cannot be calculated (Patient's most recent lab result is older than the maximum 21 days allowed.).    Allergies  Allergen Reactions   Ciprofloxacin Nausea Only    Dizziness, weakness   Loratadine-Pseudoephedrine Er     REACTION: anxious   Penicillins     REACTION: swelling   Tolterodine Tartrate     REACTION: nausea    Antimicrobials this admission: 4/14 vanc>> 4/14 ceftriaxone>>  Dose adjustments this admission:   Microbiology results: 4/14 blood>>   Ulyses Southward, PharmD, [place], AAHIVP, CPP Infectious Disease Pharmacist 06/28/2022 4:50 PM

## 2022-06-28 NOTE — H&P (Addendum)
History and Physical    Brianna York:[id number] DOB: Nov 30, 1934 DOA: 06/28/2022  PCP: Rodrigo Ran, MD   Patient coming from: Home  I have personally briefly reviewed patient's old medical records in [facility]  Chief Complaint: Right shoulder pain and redness  HPI: Brianna York is a 88 y.o. female with medical history significant for chronic ITP, CLL, dementia. Patient was brought to the ED with reports of nasal redness to rash involving her shoulder.  The time of my evaluation, patient is awake alert oriented to person, she has mild baseline dementia, able to answer simple questions, but unable to give detailed history.  Patient lives with her brother Onalee Hua, and he is patient's primary caregiver.  Patient's neighbor/friend [name] is at bedside and assist with the history.   Patient has had a chronic rash to her right shoulder right upper back, and sacral area.  Rash has been present for at least 6 months.  She has a habit of picking on scabs when the rash is trying to heal.  Over the past several days she has had worsening redness and pain to the rash on her right shoulder.  One of her leukemia meds helps with this rash when she takes it, but she had run out of this medication and this was just refilled a few days ago.  ED Course: Heart rate 87-103.  Temperature 98.3.  Heart rate 14-18.  Blood pressure systolic 110 -137.  O2 sats greater than 97% on room air. Lactic acid 1.2.  Leukocytosis of 14.2. Portable chest x-ray negative for acute abnormality. UA with large leukocytes positive nitrites, many bacteria. IV Vanco and ceftriaxone started.  Normal saline at 150 cc/h started.  Hospitalist to admit.  Review of Systems: As per HPI all other systems reviewed and negative.  Past Medical History:  Diagnosis Date   Adenomatous colon polyp    tubular   Arthritis    osteo arthritis   Breast cancer 02/16/2011   Diverticulosis    GERD (gastroesophageal reflux disease)     Hyperlipidemia    Leukemia    Lymphoma    small lympocytic   Osteopenia     Past Surgical History:  Procedure Laterality Date   bartholian cyst  1986   BREAST LUMPECTOMY Left 09-16-2010   CATARACT EXTRACTION, BILATERAL     CESAREAN SECTION  1958, 1960, 1963   KNEE ARTHROSCOPY Right 2010   LYMPH GLAND EXCISION Right 11/16/2014   Procedure: EXCISION AND BIOPSY OF RIGHT CERVICAL LYMPH NODE;  Surgeon: Avel Peace, MD;  Location: [facility];  Service: General;  Laterality: Right;   TONSILLECTOMY  1940     reports that she quit smoking about 35 years ago. Her smoking use included cigarettes. She has never used smokeless tobacco. She reports current alcohol use. She reports that she does not use drugs.  Allergies  Allergen Reactions   Ciprofloxacin Nausea Only    Dizziness, weakness   Loratadine-Pseudoephedrine Er     REACTION: anxious   Penicillins     REACTION: swelling   Tolterodine Tartrate     REACTION: nausea    Family History  Problem Relation Age of Onset   Colon cancer Mother 34   Colon cancer Maternal Aunt        had colon sugery   Leukemia Father    Diabetes Brother    Diabetes Brother     Prior to Admission medications   Medication Sig Start Date End Date Taking? Authorizing  Provider  acalabrutinib maleate (CALQUENCE) 100 MG tablet Take 1 tablet (100 mg total) by mouth daily. 06/24/22   Johney Maine, MD  alendronate (FOSAMAX) 70 MG tablet Take 70 mg by mouth once a week. Take with a full glass of water on an empty stomach.    [provider]  atorvastatin (LIPITOR) 10 MG tablet Take 10 mg by mouth daily.    [provider]  cetirizine (ZYRTEC) 10 MG tablet TAKE ONE TABLET AT BEDTIME 08/27/21   Johney Maine, MD  Cyanocobalamin (B-12) 1000 MCG SUBL Place 1,000 mcg under the tongue daily. 06/18/21   Johney Maine, MD  eltrombopag (PROMACTA) 25 MG tablet Take 1 tablet (25 mg total) by mouth daily. Take on an  empty stomach, 1 hour before a meal or 2 hours after. 06/24/22   Johney Maine, MD  valACYclovir (VALTREX) 500 MG tablet TAKE ONE TABLET BY MOUTH TWICE DAILY FOR 7 DAYS 05/09/21   Johney Maine, MD  Vitamin D, Ergocalciferol, (DRISDOL) 50000 UNITS CAPS capsule Take 50,000 Units by mouth every 7 (seven) days.    [provider]    Physical Exam: Vitals:   06/28/22 1613 06/28/22 1614 06/28/22 1715  BP: 118/60  137/70  Pulse: (!) 103  93  Resp: 14  18  Temp: 98.3 F (36.8 C)    TempSrc: Oral    SpO2: 100%  97%  Weight:  40.8 kg   Height:   (1.499 m)     Constitutional: NAD, calm, comfortable Vitals:   06/28/22 1613 06/28/22 1614 06/28/22 1715  BP: 118/60  137/70  Pulse: (!) 103  93  Resp: 14  18  Temp: 98.3 F (36.8 C)    TempSrc: Oral    SpO2: 100%  97%  Weight:  40.8 kg   Height:   (1.499 m)    Eyes: PERRL, lids and conjunctivae normal ENMT: Mucous membranes are dry Neck: normal, supple, no masses, no thyromegaly Respiratory: clear to auscultation bilaterally, no wheezing, no crackles. Normal respiratory effort. No accessory muscle use.  Cardiovascular: Regular rate and rhythm, no murmurs / rubs / gallops. No extremity edema.  Extremities warm.  Abdomen: no tenderness, no masses palpated. No hepatosplenomegaly. Bowel sounds positive.  Musculoskeletal: no clubbing / cyanosis. No joint deformity upper and lower extremities.  Skin: Multiple superficial wounds to right shoulder extending to upper scapular area, mild streaking down to upper aspect of right breast, with significant erythema, and tenderness to touch.  No purulence appreciated. Few wounds to lower sacral area, appears mostly healed, no signs of infection. Neurologic: No apparent cranial nerve abnormality, moving extremities spontaneously Psychiatric: Normal judgment and insight. Alert and oriented x person. Normal mood.   Labs on Admission: I have personally reviewed following labs  and imaging studies  CBC: Recent Labs  Lab 06/28/22 1616  WBC 14.2*  NEUTROABS 4.9  HGB 11.6*  HCT 36.7  MCV 81.7  PLT 293   Basic Metabolic Panel: Recent Labs  Lab 06/28/22 1616  NA 131*  K 3.6  CL 95*  CO2 23  GLUCOSE 119*  BUN 19  CREATININE 0.81  CALCIUM 8.8*   GFR: Estimated Creatinine Clearance: 32.1 mL/min (by C-G formula based on SCr of 0.81 mg/dL). Liver Function Tests: Recent Labs  Lab 06/28/22 1616  AST 18  ALT 10  ALKPHOS 55  BILITOT 1.1  PROT 7.3  ALBUMIN 3.1*   Coagulation Profile: Recent Labs  Lab 06/28/22 1616  INR 1.2  Radiological Exams on Admission: DG Chest Port 1 View  Result Date: 06/28/2022 CLINICAL DATA:  Questionable sepsis. EXAM: PORTABLE CHEST 1 VIEW COMPARISON:  Chest x-ray dated 09/15/2010 FINDINGS: Cardiomegaly. Coarse lung markings bilaterally. No confluent opacity to suggest a consolidating pneumonia. No pleural effusion or pneumothorax is seen. Osseous structures about the chest are unremarkable. IMPRESSION: 1. No active disease. No evidence of pneumonia or pulmonary edema. 2. Cardiomegaly. Electronically Signed   By: Bary Richard M.D.   On: 06/28/2022 17:27    EKG: Independently reviewed.  Sinus rhythm, rate 90, QTc 419.  No significant change from prior.  Assessment/Plan Principal Problem:   Cellulitis of right shoulder Active Problems:   Breast cancer of upper-outer quadrant of left female breast   CLL (chronic lymphocytic leukemia)   Chronic ITP (idiopathic thrombocytopenia)   UTI (urinary tract infection)   Dementia  Assessment and Plan: * Cellulitis of right shoulder Cellulitis involving skin of right shoulder extending towards right breast, and right upper scapula posteriorly.  Open superficial wounds present.  No purulent drainage.  Leukocytosis 14.2, tachycardia heart rate 87-103.  Stable blood pressure.  Rules in for sepsis.  Normal lactic acid 1.2. -Continue IV ceftriaxone and IV vancomycin -Follow-up  blood cultures -Reduce normal saline rate to N/s 75cc/hr x 20hrs -Her medication Calquence helps with her chronic rash, she ran out but this has just been reviewed, family encouraged to bring this medication to the hospital  Dementia Daughter reports significant dementia, significant decline over the past 6 months.  Patient currently awake alert oriented to person, answering simple questions but unable to give detailed history.  UTI (urinary tract infection) UA suggestive of UTI.  Will send for sepsis with tachycardia, heart rate 87-103, leukocytosis of 14.3.  Sepsis also likely secondary to concomitant cellulitis.  No recent urine cultures. -Continue IV ceftriaxone -Follow-up urine cultures  CLL (chronic lymphocytic leukemia) Follows with Dr. Candise Che.  -Resume Promacta, acalabrutinib (this med helps with her rash)  Breast cancer of upper-outer quadrant of left female breast Follows with Dr. Candise Che -Resume anastrozole   DVT prophylaxis: Lovenox Code Status: FULL code- confirmed with patient, and friend/neighbor Margarita at bedside Family Communication: Friend at bedside, called patients daughter- Prentiss Bells, who is Product manager, she lives in [place].  Contact [id number]. Disposition Plan: ~ 2 days Consults called: None Admission status: Inpt tele I certify that at the point of admission it is my clinical judgment that the patient will require inpatient hospital care spanning beyond 2 midnights from the point of admission due to high intensity of service, high risk for further deterioration and high frequency of surveillance required.    Author: Onnie Boer, MD 06/28/2022 10:24 PM  For on call review [email or web address].

## 2022-06-28 NOTE — ED Provider Notes (Signed)
[place] EMERGENCY DEPARTMENT AT [facility] Provider Note   CSN: [id number] Arrival date & time: 06/28/22  1600     History  Chief Complaint  Patient presents with   Rash    Brianna York is a 88 y.o. female.   Rash  88 year old female, she has a history of CLL and ITP related thrombocytopenia.  She has been followed by oncology, Dr. Candise Che and has been seen in the office multiple times, she has a history of sores on her right upper chest and shoulder and back secondary to itching and scratching which is related to her underlying hematologic disorders.  The family was concerned about her several days ago and contacted the office because they thought that some of the sores had become infected, the patient had not been adherent to her medications including her bone marrow stimulant Promacta, she was informed that she needed to take her medications and refills were called in.  She presents today because of some redness over the right chest, she presents by ambulance with normal vital signs.  The patient states that she is having some increasing pain in the right breast which correlates with the area of redness.    Home Medications Prior to Admission medications   Medication Sig Start Date End Date Taking? Authorizing Provider  acalabrutinib maleate (CALQUENCE) 100 MG tablet Take 1 tablet (100 mg total) by mouth daily. 06/24/22   Johney Maine, MD  alendronate (FOSAMAX) 70 MG tablet Take 70 mg by mouth once a week. Take with a full glass of water on an empty stomach.    [provider]  atorvastatin (LIPITOR) 10 MG tablet Take 10 mg by mouth daily.    [provider]  cetirizine (ZYRTEC) 10 MG tablet TAKE ONE TABLET AT BEDTIME 08/27/21   Johney Maine, MD  Cyanocobalamin (B-12) 1000 MCG SUBL Place 1,000 mcg under the tongue daily. 06/18/21   Johney Maine, MD  eltrombopag (PROMACTA) 25 MG tablet Take 1 tablet (25 mg total) by mouth daily.  Take on an empty stomach, 1 hour before a meal or 2 hours after. 06/24/22   Johney Maine, MD  valACYclovir (VALTREX) 500 MG tablet TAKE ONE TABLET BY MOUTH TWICE DAILY FOR 7 DAYS 05/09/21   Johney Maine, MD  Vitamin D, Ergocalciferol, (DRISDOL) 50000 UNITS CAPS capsule Take 50,000 Units by mouth every 7 (seven) days.    [provider]      Allergies    Ciprofloxacin, Loratadine-pseudoephedrine er, Penicillins, and Tolterodine tartrate    Review of Systems   Review of Systems  Skin:  Positive for rash.  All other systems reviewed and are negative.   Physical Exam Updated Vital Signs BP 137/70 (BP Location: Left Arm)   Pulse 93   Temp 98.3 F (36.8 C) (Oral)   Resp 18   Ht 1.499 m (4\' 11" )   Wt 40.8 kg   SpO2 97%   BMI 18.18 kg/m  Physical Exam Vitals and nursing note reviewed.  Constitutional:      General: She is not in acute distress.    Appearance: She is well-developed.  HENT:     Head: Normocephalic and atraumatic.     Mouth/Throat:     Pharynx: No oropharyngeal exudate.  Eyes:     General: No scleral icterus.       Right eye: No discharge.        Left eye: No discharge.     Conjunctiva/sclera: Conjunctivae  normal.     Pupils: Pupils are equal, round, and reactive to light.  Neck:     Thyroid: No thyromegaly.     Vascular: No JVD.  Cardiovascular:     Rate and Rhythm: Normal rate and regular rhythm.     Heart sounds: Normal heart sounds. No murmur heard.    No friction rub. No gallop.  Pulmonary:     Effort: Pulmonary effort is normal. No respiratory distress.     Breath sounds: Normal breath sounds. No wheezing or rales.  Abdominal:     General: Bowel sounds are normal. There is no distension.     Palpations: Abdomen is soft. There is no mass.     Tenderness: There is no abdominal tenderness.  Musculoskeletal:        General: No tenderness. Normal range of motion.     Cervical back: Normal range of motion and neck supple.   Lymphadenopathy:     Cervical: No cervical adenopathy.  Skin:    General: Skin is warm and dry.     Coloration: Skin is not jaundiced.     Findings: Erythema, lesion and rash present. No bruising.     Comments: Multiple open sores with significant cellulitis of almost the entirety of the right breast, the sores extend on the right chest right shoulder across the buttock as well  Neurological:     General: No focal deficit present.     Mental Status: She is alert.     Coordination: Coordination normal.  Psychiatric:        Behavior: Behavior normal.     ED Results / Procedures / Treatments   Labs (all labs ordered are listed, but only abnormal results are displayed) Labs Reviewed  COMPREHENSIVE METABOLIC PANEL - Abnormal; Notable for the following components:      Result Value   Sodium 131 (*)    Chloride 95 (*)    Glucose, Bld 119 (*)    Calcium 8.8 (*)    Albumin 3.1 (*)    All other components within normal limits  CBC WITH DIFFERENTIAL/PLATELET - Abnormal; Notable for the following components:   WBC 14.2 (*)    Hemoglobin 11.6 (*)    MCH 25.8 (*)    Lymphs Abs 8.7 (*)    All other components within normal limits  CULTURE, BLOOD (ROUTINE X 2)  CULTURE, BLOOD (ROUTINE X 2)  RESP PANEL BY RT-PCR (RSV, FLU A&B, COVID)  RVPGX2  LACTIC ACID, PLASMA  PROTIME-INR  APTT  LACTIC ACID, PLASMA  URINALYSIS, W/ REFLEX TO CULTURE (INFECTION SUSPECTED)    EKG EKG Interpretation  Date/Time:  Sunday June 28 2022 16:49:49 EDT Ventricular Rate:  90 PR Interval:  133 QRS Duration: 85 QT Interval:  342 QTC Calculation: 419 R Axis:   -38 Text Interpretation: Sinus rhythm Left axis deviation Low voltage, extremity and precordial leads RSR' in V1 or V2, right VCD or RVH Confirmed by Eber Hong ([id number]) on 06/28/2022 5:03:16 PM  Radiology DG Chest Port 1 View  Result Date: 06/28/2022 CLINICAL DATA:  Questionable sepsis. EXAM: PORTABLE CHEST 1 VIEW COMPARISON:  Chest x-ray  dated 09/15/2010 FINDINGS: Cardiomegaly. Coarse lung markings bilaterally. No confluent opacity to suggest a consolidating pneumonia. No pleural effusion or pneumothorax is seen. Osseous structures about the chest are unremarkable. IMPRESSION: 1. No active disease. No evidence of pneumonia or pulmonary edema. 2. Cardiomegaly. Electronically Signed   By: Bary Richard M.D.   On: 06/28/2022 17:27    Procedures .Critical  Care  Performed by: Eber Hong, MD Authorized by: Eber Hong, MD   Critical care provider statement:    Critical care time (minutes):  45   Critical care time was exclusive of:  Separately billable procedures and treating other patients   Critical care was necessary to treat or prevent imminent or life-threatening deterioration of the following conditions:  Sepsis   Critical care was time spent personally by me on the following activities:  Development of treatment plan with patient or surrogate, discussions with consultants, evaluation of patient's response to treatment, examination of patient, obtaining history from patient or surrogate, review of old charts, re-evaluation of patient's condition, pulse oximetry, ordering and review of radiographic studies, ordering and review of laboratory studies and ordering and performing treatments and interventions   I assumed direction of critical care for this patient from another provider in my specialty: no     Care discussed with: admitting provider   Comments:           Medications Ordered in ED Medications  0.9 %  sodium chloride infusion ( Intravenous New Bag/Given 06/28/22 1637)  vancomycin (VANCOCIN) IVPB 1000 mg/200 mL premix (1,000 mg Intravenous New Bag/Given 06/28/22 1640)  cefTRIAXone (ROCEPHIN) 2 g in sodium chloride 0.9 % 100 mL IVPB (has no administration in time range)    ED Course/ Medical Decision Making/ A&P                             Medical Decision Making Amount and/or Complexity of Data  Reviewed Labs: ordered. Radiology: ordered. ECG/medicine tests: ordered.  Risk Prescription drug management. Decision regarding hospitalization.   This patient presents to the ED for concern of increasing rash and spreading redness across the right breast, this involves an extensive number of treatment options, and is a complaint that carries with it a high risk of complications and morbidity.  The differential diagnosis includes cellulitis, worsening CLL, organ dysfunction, sepsis   Co morbidities that complicate the patient evaluation  CLL   Additional history obtained:  Additional history obtained from electronic medical record External records from outside source obtained and reviewed including oncology notes   Lab Tests:  I Ordered, and personally interpreted labs.  The pertinent results include: Leukocytosis, renal function is preserved, mild hyponatremia   Imaging Studies ordered:  I ordered imaging studies including chest x-ray I independently visualized and interpreted imaging which showed no acute findings I agree with the radiologist interpretation   Cardiac Monitoring: / EKG:  The patient was maintained on a cardiac monitor.  I personally viewed and interpreted the cardiac monitored which showed an underlying rhythm of: Sinus tachycardia   Consultations Obtained:  I requested consultation with the hospitalist,  and discussed lab and imaging findings as well as pertinent plan - they recommend: Admission   Problem List / ED Course / Critical interventions / Medication management  Sepsis treated with antibiotics, patient is not in shock based on blood pressure I ordered medication including antibiotics for infection Reevaluation of the patient after these medicines showed that the patient critically ill I have reviewed the patients home medicines and have made adjustments as needed   Social Determinants of Health:  Sepsis   Test / Admission -  Considered:  Will admit to the hospital         Final Clinical Impression(s) / ED Diagnoses Final diagnoses:  Cellulitis of breast  Sepsis with acute organ dysfunction without septic shock, due  to unspecified organism, unspecified organ dysfunction type    Rx / DC Orders ED Discharge Orders     None         Eber Hong, MD 06/28/22 1739

## 2022-06-28 NOTE — ED Notes (Signed)
PATIENT AMBULATED TO BATHROOM WITH ASSISTANCE

## 2022-06-28 NOTE — Progress Notes (Signed)
[facility]link following for sepsis protocol. 

## 2022-06-28 NOTE — Assessment & Plan Note (Signed)
Follows with Dr. Candise Che -Resume anastrozole

## 2022-06-28 NOTE — Assessment & Plan Note (Signed)
Daughter reports significant dementia, significant decline over the past 6 months.  Patient currently awake alert oriented to person, answering simple questions but unable to give detailed history.

## 2022-06-28 NOTE — Assessment & Plan Note (Addendum)
-  UA suggestive of UTI.   -Continue current antibiotics -Follow-up culture result -Maintain adequate hydration.

## 2022-06-28 NOTE — ED Notes (Signed)
PATIENT PROVIDED DINNER TRAY

## 2022-06-29 ENCOUNTER — Encounter (HOSPITAL_COMMUNITY): Payer: Self-pay | Admitting: Internal Medicine

## 2022-06-29 ENCOUNTER — Encounter: Payer: Self-pay | Admitting: Hematology

## 2022-06-29 DIAGNOSIS — D693 Immune thrombocytopenic purpura: Secondary | ICD-10-CM

## 2022-06-29 DIAGNOSIS — C50412 Malignant neoplasm of upper-outer quadrant of left female breast: Secondary | ICD-10-CM | POA: Diagnosis not present

## 2022-06-29 DIAGNOSIS — C9110 Chronic lymphocytic leukemia of B-cell type not having achieved remission: Secondary | ICD-10-CM | POA: Diagnosis not present

## 2022-06-29 DIAGNOSIS — L03113 Cellulitis of right upper limb: Secondary | ICD-10-CM | POA: Diagnosis not present

## 2022-06-29 DIAGNOSIS — Z170 Estrogen receptor positive status [ER+]: Secondary | ICD-10-CM

## 2022-06-29 LAB — BASIC METABOLIC PANEL
Anion gap: 3 — ABNORMAL LOW (ref 5–15)
BUN: 15 mg/dL (ref 8–23)
CO2: 23 mmol/L (ref 22–32)
Calcium: 7.3 mg/dL — ABNORMAL LOW (ref 8.9–10.3)
Chloride: 106 mmol/L (ref 98–111)
Creatinine, Ser: 0.64 mg/dL (ref 0.44–1.00)
GFR, Estimated: >60 mL/min (ref >60)
Glucose, Bld: 105 mg/dL — ABNORMAL HIGH (ref 70–99)
Potassium: 4.2 mmol/L (ref 3.5–5.1)
Sodium: 132 mmol/L — ABNORMAL LOW (ref 135–145)

## 2022-06-29 LAB — CBC
HCT: 30.4 % — ABNORMAL LOW (ref 36.0–46.0)
Hemoglobin: 9.4 g/dL — ABNORMAL LOW (ref 12.0–15.0)
MCH: 25.8 pg — ABNORMAL LOW (ref 26.0–34.0)
MCHC: 30.9 g/dL (ref 30.0–36.0)
MCV: 83.5 fL (ref 80.0–100.0)
Platelets: 236 10*3/uL (ref 150–400)
RBC: 3.64 MIL/uL — ABNORMAL LOW (ref 3.87–5.11)
RDW: 14.9 % (ref 11.5–15.5)
WBC: 9 10*3/uL (ref 4.0–10.5)
nRBC: 0 % (ref 0.0–0.2)

## 2022-06-29 MED ORDER — ELTROMBOPAG OLAMINE 25 MG PO TABS
25.0000 mg | ORAL_TABLET | Freq: Every day | ORAL | Status: DC
  Administered 2022-06-30: 25 mg via ORAL
  Filled 2022-06-29 (×3): qty 1

## 2022-06-29 NOTE — Progress Notes (Signed)
Progress Note   Patient: Brianna York GDJ:[id number] DOB: 16-Mar-1935 DOA: 06/28/2022     1 DOS: the patient was seen and examined on 06/29/2022   Brief hospital course: As Brianna York is a 88 y.o. female with medical history significant for chronic ITP, CLL, dementia. Patient was brought to the ED with reports of nasal redness to rash involving her shoulder.  The time of my evaluation, patient is awake alert oriented to person, she has mild baseline dementia, able to answer simple questions, but unable to give detailed history.  Patient lives with her brother Brianna Hua, and he is patient's primary caregiver.  Patient's neighbor/friend Brianna York is at bedside and assist with the history.   Patient has had a chronic rash to her right shoulder right upper back, and sacral area.  Rash has been present for at least 6 months.  She has a habit of picking on scabs when the rash is trying to heal.  Over the past several days she has had worsening redness and pain to the rash on her right shoulder.  One of her leukemia meds helps with this rash when she takes it, but she had run out of this medication and this was just refilled a few days ago.   ED Course: Heart rate 87-103.  Temperature 98.3.  Heart rate 14-18.  Blood pressure systolic 110 -137.  O2 sats greater than 97% on room air. Lactic acid 1.2.  Leukocytosis of 14.2. Portable chest x-ray negative for acute abnormality. UA with large leukocytes positive nitrites, many bacteria. IV Vanco and ceftriaxone started.  Normal saline at 150 cc/h started.  Hospitalist to admit.per H&P written by Dr. Mariea Clonts on 06/28/2022  Assessment and Plan: * Cellulitis of right shoulder -Cellulitis involving skin of right shoulder extending towards right breast, and right upper scapula posteriorly.  Open superficial wounds present.  No purulent drainage.   -At time of admission leukocytosis 14.2, tachycardia heart rate 87-103.  Stable blood pressure.  Patient rules in  for sepsis.  Normal lactic acid 1.2. -Maintain adequate hydration, continue IV antibiotics and resewn home oncologic medications. -Wound care service contacted to assist with local care recommendations. -Follow culture results.   -Patient has been encouraged to stop scratching.    Dementia -Daughter reports significant decline over the past 6 months.  -On today's examination patient able to follow commands appropriately, adequately interacting and answer questions.  -Oriented to place, situation and person.    UTI (urinary tract infection) -UA suggestive of UTI.   -Continue current antibiotics -Follow-up culture result -Maintain adequate hydration.   Chronic ITP (idiopathic thrombocytopenia) -No operative bleeding appreciated -Holding the use of heparin pleural -Continue to follow platelet counts.  CLL (chronic lymphocytic leukemia) -- Continue outpatient follow-up with Dr. Candise Che.  -Resume Promacta, acalabrutinib (this med helps with her rash when she is compliant with usage.)  Breast cancer of upper-outer quadrant of left female breast -Continue outpatient follow-up with Dr. Candise Che -Resume anastrozole   Subjective:  No chest pain, no nausea, no vomiting.  Complaining of increased frequency and expressing diffuse itching.  Physical Exam: Vitals:   06/29/22 0030 06/29/22 0412 06/29/22 1107 06/29/22 1409  BP: (!) 125/56 (!) 115/49 (!) 122/59 (!) 119/59  Pulse: 86 88 94 93  Resp: 20 17  16   Temp: 98.1 F (36.7 C) 98.8 F (37.1 C) 98.7 F (37.1 C) 98.2 F (36.8 C)  TempSrc: Oral Oral Oral Oral  SpO2: 100% 100% 99% 96%  Weight: 51.9 kg  Height: 4\' 11"  (1.499 m)      General exam: Alert, awake, oriented x 2; following commands appropriately and expressing increase itching on her skin.  No chest pain, no nausea, no vomiting.  Patient reported increased frequency. Respiratory system: Clear to auscultation. Respiratory effort normal.  Tolerating room air. Cardiovascular  system:RRR. No rubs or gallops; no JVD. Gastrointestinal system: Abdomen is nondistended, soft and nontender. No organomegaly or masses felt. Normal bowel sounds heard. Central nervous system: Alert and oriented. No focal neurological deficits. Extremities: No cyanosis or clubbing. Skin: Diffuse rash spreading from her back supraclavicular area right axilla confirmed chest with associated aspiration and mild serosanguineous drainage.  There are old areas of excoriation from previous scratching and scabs on her body. Psychiatry: Judgement and insight appear normal. Mood & affect appropriate.   Data Reviewed: CBC: WBCs 9.0, hemoglobin 9.4 and platelet count 236 K Basic metabolic panel:Sodium 132, potassium 4.2, chloride 106, bicarb 23, BUN 15, creatinine 0.64 and GFR more than 60.  Family Communication: No family at bedside.  Disposition: Status is: Inpatient Remains inpatient appropriate because: Continue IV antibiotics.   Planned Discharge Destination: Home with home health services.    Time spent: 35 minutes  Author: Vassie Loll, MD 06/29/2022 5:34 PM  For on call review [email or web address].

## 2022-06-29 NOTE — TOC Progression Note (Signed)
  Transition of Care [facility]) Screening Note   Patient Details  Name: Brianna York Date of Birth: 1934/06/19   [facility] Care [facility]) CM/SW Contact:    Elliot Gault, LCSW Phone Number: 06/29/2022, 10:04 AM    Transition of Care Department [facility]) has reviewed patient and no TOC needs have been identified at this time. We will continue to monitor patient advancement through interdisciplinary progression rounds. If new patient transition needs arise, please place a TOC consult.

## 2022-06-29 NOTE — Progress Notes (Signed)
Patient admitted to floor with cellulitis.  Patient is alert and able to answer questions appropriately. Patient has redness to her right breast, and  right chest.  Right chest, has appears to have top layer of skin gone and appears more like a wound or burn.  Patient has scabs and redness to bilateral legs, and on buttocks.patient has small stage 3 to left arm pit, measuring 0.25cm x 0.25 cm.

## 2022-06-29 NOTE — Assessment & Plan Note (Signed)
-  No operative bleeding appreciated -Holding the use of heparin pleural -Continue to follow platelet counts.

## 2022-06-29 NOTE — Consult Note (Signed)
WOC Nurse Consult Note: Reason for Consult: skin rash Patient is receiving chemo for CLL, history of breast cancer. Hematology/oncology aware of rash, appears based on encounters and notes review, rash has been getting worse. History of BC in the left breast. Reported in notes to be from scratching. Dx of CLL in 2016, BC 2012 No reported diagnosis of shingles.   Wound type: chemotherapy related rash, no exposure to radiation Pressure Injury POA: NA Measurement: large area affected over the right chest, not involving the breast, some redness on the right breast. And over the shoulder onto the back Wound bed: partial thickness skin loss, lesions with yellow base, diffuse redness  Drainage (amount, consistency, odor) unable to assess  Periwound: intact  Dressing procedure/placement/frequency: Antibacterial non adherent ordered (xeroform) with dry dressings. May be able to secure with mesh underwear with the seam cut (like tube top). Change every other day to lessen disruption of hemostasis.  Discussed POC with patient and bedside nurse.  Re consult if needed, will not follow at this time. Thanks  Kami Kube [employer], RN,CWOCN, CNS, CWON-AP [phone])

## 2022-06-30 DIAGNOSIS — L03113 Cellulitis of right upper limb: Secondary | ICD-10-CM | POA: Diagnosis not present

## 2022-06-30 DIAGNOSIS — D693 Immune thrombocytopenic purpura: Secondary | ICD-10-CM | POA: Diagnosis not present

## 2022-06-30 DIAGNOSIS — C50412 Malignant neoplasm of upper-outer quadrant of left female breast: Secondary | ICD-10-CM | POA: Diagnosis not present

## 2022-06-30 DIAGNOSIS — C9110 Chronic lymphocytic leukemia of B-cell type not having achieved remission: Secondary | ICD-10-CM | POA: Diagnosis not present

## 2022-06-30 LAB — URINE CULTURE

## 2022-06-30 MED ORDER — HYDROXYZINE HCL 25 MG PO TABS: 25.0000 mg | ORAL_TABLET | Freq: Three times a day (TID) | ORAL | Status: DC | PRN

## 2022-06-30 MED ORDER — ENOXAPARIN SODIUM 40 MG/0.4ML IJ SOSY
40.0000 mg | PREFILLED_SYRINGE | INTRAMUSCULAR | Status: DC
  Filled 2022-06-30: qty 0.4

## 2022-06-30 MED ORDER — ORAL CARE MOUTH RINSE: 15.0000 mL | OROMUCOSAL | Status: DC | PRN

## 2022-06-30 NOTE — Progress Notes (Signed)
Patient has removed her dressing to her shoulder and continues to scratch. Dressing reinforced.

## 2022-06-30 NOTE — Progress Notes (Signed)
Mobility Specialist Progress Note:    06/30/22 1220  Mobility  Activity Ambulated with assistance to bathroom  Level of Assistance Contact guard assist, steadying assist  Assistive Device None  Distance Ambulated (ft) 15 ft  Activity Response Tolerated well  Mobility Referral Yes  $Mobility charge 1 Mobility   Pt agreeable to mobility session. Tolerated well, asx throughout. Pt requested assistance to bathroom. Returned pt to bed, all needs met, alarm on, call bell in reach and pt's brother in room.  Feliciana Rossetti Mobility Specialist Please contact via Special educational needs teacher or  Rehab office at [phone]

## 2022-06-30 NOTE — Progress Notes (Signed)
Wound care provided to patient per order. Patient has open sores to bilateral shoulders, buttocks and lower legs. Patient picks and scratches at her sores. Patient complaining of itching, assisted patient with bath and full linen change.

## 2022-06-30 NOTE — TOC Progression Note (Signed)
Transition of Care [facility]) - Progression Note    Patient Details  Name: JOEY HUDOCK MRN: [id number] Date of Birth: 10-19-1934  Transition of Care [facility]) CM/SW Contact  Leitha Bleak, RN Phone Number: 06/30/2022, 11:30 AM  Clinical Narrative:   CM spoke with patient son and Daughter, They have concern with their mother returning home. She can no longer depend on her brother. She drinks a lot and leaves her home alone. She has dementia and confusion. They are in the process of getting an assessment for her to go to [facility] in [place] or [place].  TOC has requested a PT eval. Ideally they would like her to go to [facility] for SNF then transfer to ALF.  Patient is on a cancer medication that is shipped to the home. The family will bring that to facility. Patient also has two LTC policies they have activated the waiting period.  TOC following.    Expected Discharge Plan: Skilled Nursing Facility Barriers to Discharge: Continued Medical Work up  Expected Discharge Plan and Services     Social Determinants of Health (SDOH) Interventions SDOH Screenings   Tobacco Use: Medium Risk (06/29/2022)    Readmission Risk Interventions     No data to display

## 2022-06-30 NOTE — Progress Notes (Signed)
Progress Note   Patient: Brianna York:[id number] DOB: [birth year] DOA: 06/28/2022     2 DOS: the patient was seen and examined on 06/30/2022   Brief hospital course: As Brianna York is a 88 y.o. female with medical history significant for chronic ITP, CLL, dementia. Patient was brought to the ED with reports of nasal redness to rash involving her shoulder.  The time of my evaluation, patient is awake alert oriented to person, she has mild baseline dementia, able to answer simple questions, but unable to give detailed history.  Patient lives with her brother Onalee Hua, and he is patient's primary caregiver.  Patient's neighbor/friend [name] is at bedside and assist with the history.   Patient has had a chronic rash to her right shoulder right upper back, and sacral area.  Rash has been present for at least 6 months.  She has a habit of picking on scabs when the rash is trying to heal.  Over the past several days she has had worsening redness and pain to the rash on her right shoulder.  One of her leukemia meds helps with this rash when she takes it, but she had run out of this medication and this was just refilled a few days ago.   ED Course: Heart rate 87-103.  Temperature 98.3.  Heart rate 14-18.  Blood pressure systolic 110 -137.  O2 sats greater than 97% on room air. Lactic acid 1.2.  Leukocytosis of 14.2. Portable chest x-ray negative for acute abnormality. UA with large leukocytes positive nitrites, many bacteria. IV Vanco and ceftriaxone started.  Normal saline at 150 cc/h started.  Hospitalist to admit.per H&P written by Dr. Mariea Clonts on 06/28/2022  Assessment and Plan: * Cellulitis of right shoulder -Cellulitis involving skin of right shoulder extending towards right breast, and right upper scapula posteriorly.  Open superficial wounds present.  No purulent drainage.   -At time of admission leukocytosis 14.2, tachycardia heart rate 87-103.  Stable blood pressure.  Patient rules in  for sepsis.  Normal lactic acid 1.2. -Maintain adequate hydration, continue IV antibiotics and resewn home oncologic medications. -Wound care service contacted to assist with local care recommendations. -Follow culture results.   -Patient has been encouraged to stop scratching.    Dementia -Daughter reports significant decline over the past 6 months.  -On today's examination patient able to follow commands appropriately, adequately interacting and answer questions.  -Oriented to place, situation and person.    UTI (urinary tract infection) -UA suggestive of UTI.   -Continue current antibiotics -Follow-up culture result -Maintain adequate hydration.   Chronic ITP (idiopathic thrombocytopenia) -No operative bleeding appreciated -Holding the use of heparin pleural -Continue to follow platelet counts.  CLL (chronic lymphocytic leukemia) -- Continue outpatient follow-up with Dr. Candise Che.  -Resume Promacta, acalabrutinib (this med helps with her rash when she is compliant with usage.)  Breast cancer of upper-outer quadrant of left female breast -Continue outpatient follow-up with Dr. Candise Che -Resume anastrozole   Subjective:  No chest pain, no nausea, no vomiting.  Patient is afebrile.  Complaining of diffuse itching and general malaise/weakness.  Physical Exam: Vitals:   06/29/22 2216 06/30/22 0517 06/30/22 0905 06/30/22 1343  BP: 129/72 134/67 (!) 105/59 119/60  Pulse: 86 91 96 93  Resp: Temp: 98.4 F (36.9 C) 98.4 F (36.9 C) 98.2 F (36.8 C) 98.2 F (36.8 C)  TempSrc: Oral  Oral Oral  SpO2: 100% 97% 97% 100%  Weight:  Height:       General exam: Alert, awake, oriented x 2; answering questions appropriately and following commands.  Reporting diffuse itching and generalized weakness.  No chest pain, no nausea, no vomiting. Respiratory system: Good air movement bilaterally; no using accessory muscles.  Good saturation on room air. Cardiovascular system:RRR. No  rubs or gallops; no JVD. Gastrointestinal system: Abdomen is nondistended, soft and nontender. No organomegaly or masses felt. Normal bowel sounds heard. Central nervous system: No focal neurological deficits. Extremities: No cyanosis or clubbing. Skin: No petechiae; diffuse spreading rash/excoriation lesions with serosanguineous drainage affecting back supraclavicular area and June aspect of her chest and lateral chest on the right side.  There is also swollen excoriation and scabs in her lower extremities and torso. Psychiatry: Mood and affect appropriate.  Latest data Reviewed: CBC: WBCs 9.0, hemoglobin 9.4 and platelet count 236 K Basic metabolic panel:Sodium 132, potassium 4.2, chloride 106, bicarb 23, BUN 15, creatinine 0.64 and GFR more than 60.  Family Communication: Brother at bedside.  Disposition: Status is: Inpatient Remains inpatient appropriate because: Continue IV antibiotics.   Planned Discharge Destination: To be determined; family insisted on patient to be placed for rehabilitation and further care prior to returning home.    Time spent: 35 minutes  Author: Vassie Loll, MD 06/30/2022 4:55 PM  For on call review [email or web address].

## 2022-07-01 DIAGNOSIS — L03113 Cellulitis of right upper limb: Secondary | ICD-10-CM | POA: Diagnosis not present

## 2022-07-01 LAB — CREATININE, SERUM
Creatinine, Ser: 0.65 mg/dL (ref 0.44–1.00)
GFR, Estimated: >60 mL/min (ref >60)

## 2022-07-01 LAB — CULTURE, BLOOD (ROUTINE X 2): Culture: NO GROWTH

## 2022-07-01 MED ORDER — ELTROMBOPAG OLAMINE 25 MG PO TABS: 25.0000 mg | ORAL_TABLET | ORAL | Status: DC

## 2022-07-01 MED ORDER — DOXYCYCLINE HYCLATE 100 MG PO TBEC: 100.0000 mg | DELAYED_RELEASE_TABLET | Freq: Two times a day (BID) | ORAL | 0 refills | Status: AC

## 2022-07-01 NOTE — Discharge Summary (Signed)
Physician Discharge Summary  Brianna York ZOX:[id number] DOB: [birth year] DOA: 06/28/2022  PCP: Rodrigo Ran, MD  Admit date: 06/28/2022  Discharge date: 07/01/2022  Admitted From:Home  Disposition:  Home  Recommendations for Outpatient Follow-up:  Follow up with PCP in 1-2 weeks Continue on doxycycline as prescribed for 7 more days to complete total 10-day course of treatment for cellulitis Continue other home medications as prior  Home Health: Yes with RN, PT, CSW, and home health aide  Equipment/Devices: None  Discharge Condition:Stable  CODE STATUS: Full  Diet recommendation: Heart Healthy  Brief/Interim Summary:  Brianna York is a 88 y.o. female with medical history significant for chronic ITP, CLL, dementia. Patient was brought to the ED with reports of nasal redness to rash involving her shoulder.  She was admitted with findings of cellulitis to her right shoulder with superficial wounds present as well as possible UTI.  She was evaluated by wound care and dressing changes were recommended every other day.  She was started on IV vancomycin and Rocephin empirically with no growth on blood cultures noted.  Her condition has improved overall, but due to her baseline dementia family members were interested in placement to SNF or ALF which was being considered.  Unfortunately, she did not qualify for SNF and she was offered ALF which family members then refused.  She will now be discharged with home health services and oral antibiotics as ordered.  No other acute events noted during this hospitalization.  Discharge Diagnoses:  Principal Problem:   Cellulitis of right shoulder Active Problems:   Breast cancer of upper-outer quadrant of left female breast   CLL (chronic lymphocytic leukemia)   Chronic ITP (idiopathic thrombocytopenia)   UTI (urinary tract infection)   Dementia  Principal discharge diagnosis: Right shoulder cellulitis with possible UTI.  Discharge  Instructions  Discharge Instructions     Diet - low sodium heart healthy   Complete by: As directed    Discharge wound care:   Complete by: As directed    Every other day    Comments: Gently cleanse affected areas with saline, pat dry Cover all affected areas with single layer of xeroform gauze, top with gauze.   If possible use mesh underwear and cut (seam) to make like tube top, use around waist and over shoulder if possible to hold in place instead of using tape on patient's skin   Increase activity slowly   Complete by: As directed       Allergies as of 07/01/2022       Reactions   Penicillins Swelling   REACTION: swelling   Ciprofloxacin Nausea Only   Dizziness, weakness   Loratadine-pseudoephedrine Er Anxiety   REACTION: anxious   Tolterodine Tartrate Nausea Only   REACTION: nausea        Medication List     STOP taking these medications    valACYclovir 500 MG tablet Commonly known as: VALTREX       TAKE these medications    alendronate 70 MG tablet Commonly known as: FOSAMAX Take 70 mg by mouth once a week. Take with a full glass of water on an empty stomach.   atorvastatin 10 MG tablet Commonly known as: LIPITOR Take 10 mg by mouth daily.   B-12 1000 MCG Subl Place 1,000 mcg under the tongue daily.   Calquence 100 MG tablet Generic drug: acalabrutinib maleate Take 1 tablet (100 mg total) by mouth daily.   cetirizine 10 MG tablet Commonly known as: ZYRTEC TAKE  ONE TABLET AT BEDTIME   doxycycline 100 MG EC tablet Commonly known as: DORYX Take 1 tablet (100 mg total) by mouth 2 (two) times daily for 7 days.   eltrombopag 25 MG tablet Commonly known as: PROMACTA Take 1 tablet (25 mg total) by mouth daily. Take on an empty stomach, 1 hour before a meal or 2 hours after.   galantamine 8 MG 24 hr capsule Commonly known as: RAZADYNE ER Take 8 mg by mouth every morning.   memantine 10 MG tablet Commonly known as: NAMENDA Take 10 mg by mouth  2 (two) times daily.   Vitamin D (Ergocalciferol) 1.25 MG (50000 UNIT) Caps capsule Commonly known as: DRISDOL Take 50,000 Units by mouth every 7 (seven) days.               Discharge Care Instructions  (From admission, onward)           Start     Ordered   07/01/22 0000  Discharge wound care:       Comments: Every other day    Comments: Gently cleanse affected areas with saline, pat dry Cover all affected areas with single layer of xeroform gauze, top with gauze.   If possible use mesh underwear and cut (seam) to make like tube top, use around waist and over shoulder if possible to hold in place instead of using tape on patient's skin   07/01/22 1129            Follow-up Information     Rodrigo Ran, MD. Schedule an appointment as soon as possible for a visit in 1 week(s).   Specialty: Internal Medicine Contact information: [address] [place] [address] [phone]                Allergies  Allergen Reactions   Penicillins Swelling    REACTION: swelling   Ciprofloxacin Nausea Only    Dizziness, weakness   Loratadine-Pseudoephedrine Er Anxiety    REACTION: anxious   Tolterodine Tartrate Nausea Only    REACTION: nausea    Consultations: None   Procedures/Studies: DG Chest Port 1 View  Result Date: 06/28/2022 CLINICAL DATA:  Questionable sepsis. EXAM: PORTABLE CHEST 1 VIEW COMPARISON:  Chest x-ray dated 09/15/2010 FINDINGS: Cardiomegaly. Coarse lung markings bilaterally. No confluent opacity to suggest a consolidating pneumonia. No pleural effusion or pneumothorax is seen. Osseous structures about the chest are unremarkable. IMPRESSION: 1. No active disease. No evidence of pneumonia or pulmonary edema. 2. Cardiomegaly. Electronically Signed   By: Bary Richard M.D.   On: 06/28/2022 17:27     Discharge Exam: Vitals:   06/30/22 2129 07/01/22 0534  BP: (!) 118/56 (!) 112/48  Pulse: 82 76  Resp: 18 16  Temp: 98.3 F (36.8 C) 98.6 F  (37 C)  SpO2: 100% 100%   Vitals:   06/30/22 0905 06/30/22 1343 06/30/22 2129 07/01/22 0534  BP: (!) 105/59 119/60 (!) 118/56 (!) 112/48  Pulse: 96 93 82 76  Resp: 18  18 16   Temp: 98.2 F (36.8 C) 98.2 F (36.8 C) 98.3 F (36.8 C) 98.6 F (37 C)  TempSrc: Oral Oral    SpO2: 97% 100% 100% 100%  Weight:      Height:        General: Pt is alert, awake, not in acute distress Cardiovascular: RRR, S1/S2 +, no rubs, no gallops Respiratory: CTA bilaterally, no wheezing, no rhonchi Abdominal: Soft, NT, ND, bowel sounds + Extremities: no edema, no cyanosis, right shoulder superficial  wounds with mild erythema present; no drainage    The results of significant diagnostics from this hospitalization (including imaging, microbiology, ancillary and laboratory) are listed below for reference.     Microbiology: Recent Results (from the past 240 hour(s))  Resp panel by RT-PCR (RSV, Flu A&B, Covid) Urine, Clean Catch     Status: None   Collection Time: 06/28/22  4:16 PM   Specimen: Urine, Clean Catch; Nasal Swab  Result Value Ref Range Status   SARS Coronavirus 2 by RT PCR NEGATIVE NEGATIVE Final    Comment: (NOTE) SARS-CoV-2 target nucleic acids are NOT DETECTED.  The SARS-CoV-2 RNA is generally detectable in upper respiratory specimens during the acute phase of infection. The lowest concentration of SARS-CoV-2 viral copies this assay can detect is 138 copies/mL. A negative result does not preclude SARS-Cov-2 infection and should not be used as the sole basis for treatment or other patient management decisions. A negative result may occur with  improper specimen collection/handling, submission of specimen other than nasopharyngeal swab, presence of viral mutation(s) within the areas targeted by this assay, and inadequate number of viral copies(<138 copies/mL). A negative result must be combined with clinical observations, patient history, and epidemiological information. The  expected result is Negative.  Fact Sheet for Patients:  BloggerCourse.com  Fact Sheet for Healthcare Providers:  SeriousBroker.it  This test is no t yet approved or cleared by the [place] FDA and  has been authorized for detection and/or diagnosis of SARS-CoV-2 by FDA under an Emergency Use Authorization (EUA). This EUA will remain  in effect (meaning this test can be used) for the duration of the COVID-19 declaration under Section 564(b)(1) of the Act, 21 U.S.C.section 360bbb-3(b)(1), unless the authorization is terminated  or revoked sooner.       Influenza A by PCR NEGATIVE NEGATIVE Final   Influenza B by PCR NEGATIVE NEGATIVE Final    Comment: (NOTE) The Xpert Xpress SARS-CoV-2/FLU/RSV plus assay is intended as an aid in the diagnosis of influenza from Nasopharyngeal swab specimens and should not be used as a sole basis for treatment. Nasal washings and aspirates are unacceptable for Xpert Xpress SARS-CoV-2/FLU/RSV testing.  Fact Sheet for Patients: BloggerCourse.com  Fact Sheet for Healthcare Providers: SeriousBroker.it  This test is not yet approved or cleared by the [place] FDA and has been authorized for detection and/or diagnosis of SARS-CoV-2 by FDA under an Emergency Use Authorization (EUA). This EUA will remain in effect (meaning this test can be used) for the duration of the COVID-19 declaration under Section 564(b)(1) of the Act, 21 U.S.C. section 360bbb-3(b)(1), unless the authorization is terminated or revoked.     Resp Syncytial Virus by PCR NEGATIVE NEGATIVE Final    Comment: (NOTE) Fact Sheet for Patients: BloggerCourse.com  Fact Sheet for Healthcare Providers: SeriousBroker.it  This test is not yet approved or cleared by the [place] FDA and has been authorized for detection and/or  diagnosis of SARS-CoV-2 by FDA under an Emergency Use Authorization (EUA). This EUA will remain in effect (meaning this test can be used) for the duration of the COVID-19 declaration under Section 564(b)(1) of the Act, 21 U.S.C. section 360bbb-3(b)(1), unless the authorization is terminated or revoked.  Performed at [facility], [address], [place], [place] [address]   Blood Culture (routine x 2)     Status: None (Preliminary result)   Collection Time: 06/28/22  4:16 PM   Specimen: BLOOD RIGHT FOREARM  Result Value Ref Range Status   Specimen Description  Final    BLOOD RIGHT FOREARM BOTTLES DRAWN AEROBIC AND ANAEROBIC   Special Requests Blood Culture adequate volume  Final   Culture   Final    NO GROWTH 3 DAYS Performed at [facility], [address], [place], [place] [address]    Report Status PENDING  Incomplete  Blood Culture (routine x 2)     Status: None (Preliminary result)   Collection Time: 06/28/22  4:21 PM   Specimen: BLOOD LEFT ARM  Result Value Ref Range Status   Specimen Description BLOOD LEFT ARM BOTTLES DRAWN AEROBIC AND ANAEROBIC  Final   Special Requests Blood Culture adequate volume  Final   Culture   Final    NO GROWTH 3 DAYS Performed at [facility], [address] [place], [place] [address]    Report Status PENDING  Incomplete  Urine Culture (for pregnant, neutropenic or urologic patients or patients with an indwelling urinary catheter)     Status: Abnormal   Collection Time: 06/28/22  6:00 PM   Specimen: Urine, Clean Catch  Result Value Ref Range Status   Specimen Description   Final    URINE, CLEAN CATCH Performed at [facility], [address] [place], [place] [address]    Special Requests   Final    NONE Performed at [facility], [address] [place], [place] [address]    Culture MULTIPLE SPECIES PRESENT, SUGGEST RECOLLECTION (A)  Final   Report Status 06/30/2022 FINAL  Final     Labs: BNP (last 3 results) No results for input(s):  "BNP" in the last 8760 hours. Basic Metabolic Panel: Recent Labs  Lab 06/28/22 1616 06/29/22 0612 07/01/22 0407  NA 131* 132*  --   K 3.6 4.2  --   CL 95* 106  --   CO2 23 23  --   GLUCOSE 119* 105*  --   BUN 19 15  --   CREATININE 0.81 0.64 0.65  CALCIUM 8.8* 7.3*  --    Liver Function Tests: Recent Labs  Lab 06/28/22 1616  AST 18  ALT 10  ALKPHOS 55  BILITOT 1.1  PROT 7.3  ALBUMIN 3.1*   No results for input(s): "LIPASE", "AMYLASE" in the last 168 hours. No results for input(s): "AMMONIA" in the last 168 hours. CBC: Recent Labs  Lab 06/28/22 1616 06/29/22 0612  WBC 14.2* 9.0  NEUTROABS 4.9  --   HGB 11.6* 9.4*  HCT 36.7 30.4*  MCV 81.7 83.5  PLT 293 236   Cardiac Enzymes: No results for input(s): "CKTOTAL", "CKMB", "CKMBINDEX", "TROPONINI" in the last 168 hours. BNP: Invalid input(s): "POCBNP" CBG: No results for input(s): "GLUCAP" in the last 168 hours. D-Dimer No results for input(s): "DDIMER" in the last 72 hours. Hgb A1c No results for input(s): "HGBA1C" in the last 72 hours. Lipid Profile No results for input(s): "CHOL", "HDL", "LDLCALC", "TRIG", "CHOLHDL", "LDLDIRECT" in the last 72 hours. Thyroid function studies No results for input(s): "TSH", "T4TOTAL", "T3FREE", "THYROIDAB" in the last 72 hours.  Invalid input(s): "FREET3" Anemia work up No results for input(s): "VITAMINB12", "FOLATE", "FERRITIN", "TIBC", "IRON", "RETICCTPCT" in the last 72 hours. Urinalysis    Component Value Date/Time   COLORURINE AMBER (A) 06/28/2022 1616   APPEARANCEUR CLOUDY (A) 06/28/2022 1616   LABSPEC 1.023 06/28/2022 1616   PHURINE 7.0 06/28/2022 1616   GLUCOSEU NEGATIVE 06/28/2022 1616   HGBUR MODERATE (A) 06/28/2022 1616   BILIRUBINUR NEGATIVE 06/28/2022 1616   KETONESUR 20 (A) 06/28/2022 1616   PROTEINUR 100 (  A) 06/28/2022 1616   NITRITE POSITIVE (A) 06/28/2022 1616   LEUKOCYTESUR LARGE (A) 06/28/2022 1616   Sepsis Labs Recent Labs  Lab 06/28/22 1616  06/29/22 0612  WBC 14.2* 9.0   Microbiology Recent Results (from the past 240 hour(s))  Resp panel by RT-PCR (RSV, Flu A&B, Covid) Urine, Clean Catch     Status: None   Collection Time: 06/28/22  4:16 PM   Specimen: Urine, Clean Catch; Nasal Swab  Result Value Ref Range Status   SARS Coronavirus 2 by RT PCR NEGATIVE NEGATIVE Final    Comment: (NOTE) SARS-CoV-2 target nucleic acids are NOT DETECTED.  The SARS-CoV-2 RNA is generally detectable in upper respiratory specimens during the acute phase of infection. The lowest concentration of SARS-CoV-2 viral copies this assay can detect is 138 copies/mL. A negative result does not preclude SARS-Cov-2 infection and should not be used as the sole basis for treatment or other patient management decisions. A negative result may occur with  improper specimen collection/handling, submission of specimen other than nasopharyngeal swab, presence of viral mutation(s) within the areas targeted by this assay, and inadequate number of viral copies(<138 copies/mL). A negative result must be combined with clinical observations, patient history, and epidemiological information. The expected result is Negative.  Fact Sheet for Patients:  BloggerCourse.com  Fact Sheet for Healthcare Providers:  SeriousBroker.it  This test is no t yet approved or cleared by the [place] FDA and  has been authorized for detection and/or diagnosis of SARS-CoV-2 by FDA under an Emergency Use Authorization (EUA). This EUA will remain  in effect (meaning this test can be used) for the duration of the COVID-19 declaration under Section 564(b)(1) of the Act, 21 U.S.C.section 360bbb-3(b)(1), unless the authorization is terminated  or revoked sooner.       Influenza A by PCR NEGATIVE NEGATIVE Final   Influenza B by PCR NEGATIVE NEGATIVE Final    Comment: (NOTE) The Xpert Xpress SARS-CoV-2/FLU/RSV plus assay is  intended as an aid in the diagnosis of influenza from Nasopharyngeal swab specimens and should not be used as a sole basis for treatment. Nasal washings and aspirates are unacceptable for Xpert Xpress SARS-CoV-2/FLU/RSV testing.  Fact Sheet for Patients: BloggerCourse.com  Fact Sheet for Healthcare Providers: SeriousBroker.it  This test is not yet approved or cleared by the [place] FDA and has been authorized for detection and/or diagnosis of SARS-CoV-2 by FDA under an Emergency Use Authorization (EUA). This EUA will remain in effect (meaning this test can be used) for the duration of the COVID-19 declaration under Section 564(b)(1) of the Act, 21 U.S.C. section 360bbb-3(b)(1), unless the authorization is terminated or revoked.     Resp Syncytial Virus by PCR NEGATIVE NEGATIVE Final    Comment: (NOTE) Fact Sheet for Patients: BloggerCourse.com  Fact Sheet for Healthcare Providers: SeriousBroker.it  This test is not yet approved or cleared by the [place] FDA and has been authorized for detection and/or diagnosis of SARS-CoV-2 by FDA under an Emergency Use Authorization (EUA). This EUA will remain in effect (meaning this test can be used) for the duration of the COVID-19 declaration under Section 564(b)(1) of the Act, 21 U.S.C. section 360bbb-3(b)(1), unless the authorization is terminated or revoked.  Performed at [facility], [address], [place], [place] [address]   Blood Culture (routine x 2)     Status: None (Preliminary result)   Collection Time: 06/28/22  4:16 PM   Specimen: BLOOD RIGHT FOREARM  Result Value Ref Range Status  Specimen Description   Final    BLOOD RIGHT FOREARM BOTTLES DRAWN AEROBIC AND ANAEROBIC   Special Requests Blood Culture adequate volume  Final   Culture   Final    NO GROWTH 3 DAYS Performed at [facility], [address],  [place], [place] [address]    Report Status PENDING  Incomplete  Blood Culture (routine x 2)     Status: None (Preliminary result)   Collection Time: 06/28/22  4:21 PM   Specimen: BLOOD LEFT ARM  Result Value Ref Range Status   Specimen Description BLOOD LEFT ARM BOTTLES DRAWN AEROBIC AND ANAEROBIC  Final   Special Requests Blood Culture adequate volume  Final   Culture   Final    NO GROWTH 3 DAYS Performed at [facility], [address], [place], [place] [address]    Report Status PENDING  Incomplete  Urine Culture (for pregnant, neutropenic or urologic patients or patients with an indwelling urinary catheter)     Status: Abnormal   Collection Time: 06/28/22  6:00 PM   Specimen: Urine, Clean Catch  Result Value Ref Range Status   Specimen Description   Final    URINE, CLEAN CATCH Performed at [facility], [address], [place], [place] [address]    Special Requests   Final    NONE Performed at [facility], [address], [place], [place] [address]    Culture MULTIPLE SPECIES PRESENT, SUGGEST RECOLLECTION (A)  Final   Report Status 06/30/2022 FINAL  Final     Time coordinating discharge: 35 minutes  SIGNED:   Erick Blinks, DO [facility] 07/01/2022, 11:31 AM  If 7PM-7AM, please contact night-coverage [email or web address]

## 2022-07-01 NOTE — Progress Notes (Signed)
Patient discharged with instructions given on medications and follow up visits,patient verbalized understanding. Prescriptions sent to Pharmacy of choice documented on AV'S..IV  discontinued, catheter intact.Accompanied by staff to an awaiting vehicle. 

## 2022-07-01 NOTE — TOC Transition Note (Addendum)
Transition of Care [facility]) - CM/SW Discharge Note   Patient Details  Name: Brianna York MRN: [id number] Date of Birth: 1934/10/26  Transition of Care [facility]) CM/SW Contact:  Leitha Bleak, RN Phone Number: 07/01/2022, 12:00 PM   Clinical Narrative:  CM at the bedside, brother is at the bedside, Patient is ready to go home and brother is agreeing to be there 24/7 and get additional help when he needs to go out. CM spoke with Shawna Orleans, daughter. PT is not recommending SNF. She also spoke with her mother and she is refusing to go to ALF.  She is agreeable for them to go home with Home health. MD updated. Sarah with [facility] accepted the referral for PT/RN/Aide/ SW.    Final next level of care: Home w Home Health Services Barriers to Discharge: Barriers Resolved   Patient Goals and CMS Choice CMS Medicare.gov Compare Post Acute Care list provided to:: Patient Represenative (must comment) Choice offered to / list presented to : Adult Children  Discharge Placement                   Name of family member notified: Melanie Patient and family notified of of transfer: 07/01/22  Discharge Plan and Services Additional resources added to the After Visit Summary for                            [facility] Arranged: PT HH Agency: [facility] Date [facility] Agency Contacted: 07/01/22 Time HH Agency Contacted: 1200 Representative spoke with at [facility] Agency: Sarah with [employer]  Social Determinants of Health (SDOH) Interventions SDOH Screenings   Tobacco Use: Medium Risk (06/29/2022)     Readmission Risk Interventions     No data to display

## 2022-07-01 NOTE — Evaluation (Signed)
Physical Therapy Evaluation Patient Details Name: Brianna York MRN: [id number] DOB: 1934/08/15 Today's Date: 07/01/2022  History of Present Illness  Brianna York is a 88 y.o. female with medical history significant for chronic ITP, CLL, dementia.  Patient was brought to the ED with reports of nasal redness to rash involving her shoulder.  The time of my evaluation, patient is awake alert oriented to person, she has mild baseline dementia, able to answer simple questions, but unable to give detailed history.  Patient lives with her brother Onalee Hua, and he is patient's primary caregiver.  Patient's neighbor/friend [name] is at bedside and assist with the history.    Patient has had a chronic rash to her right shoulder right upper back, and sacral area.  Rash has been present for at least 6 months.  She has a habit of picking on scabs when the rash is trying to heal.  Over the past several days she has had worsening redness and pain to the rash on her right shoulder.  One of her leukemia meds helps with this rash when she takes it, but she had run out of this medication and this was just refilled a few days ago.   Clinical Impression  Patient functioning at baseline for functional mobility and gait demonstrating good return for bed mobility, transfers to/from chair and commode in bathroom and ambulating in room/hallway without loss of balance without need for an AD.  Plan:  Patient discharged from physical therapy to care of nursing for ambulation daily as tolerated for length of stay.         Recommendations for follow up therapy are one component of a multi-disciplinary discharge planning process, led by the attending physician.  Recommendations may be updated based on patient status, additional functional criteria and insurance authorization.  Follow Up Recommendations       Assistance Recommended at Discharge Set up Supervision/Assistance  Patient can return home with the following   Help with stairs or ramp for entrance;Assistance with cooking/housework;A little help with bathing/dressing/bathroom    Equipment Recommendations None recommended by PT  Recommendations for Other Services       Functional Status Assessment Patient has not had a recent decline in their functional status     Precautions / Restrictions Precautions Precautions: None Restrictions Weight Bearing Restrictions: No      Mobility  Bed Mobility Overal bed mobility: Independent                  Transfers Overall transfer level: Independent                      Ambulation/Gait Ambulation/Gait assistance: Modified independent (Device/Increase time) Gait Distance (Feet): 150 Feet Assistive device: None Gait Pattern/deviations: WFL(Within Functional Limits) Gait velocity: slightly decreased     General Gait Details: grossly WFL with good retun for ambulating in room, hallway without loss of balance  Stairs            Wheelchair Mobility    Modified Rankin (Stroke Patients Only)       Balance Overall balance assessment: No apparent balance deficits (not formally assessed)                                           Pertinent Vitals/Pain Pain Assessment Pain Assessment: No/denies pain    Home Living Family/patient expects to be discharged to::  Private residence Living Arrangements: Other relatives Available Help at Discharge: Family;Available 24 hours/day Type of Home: House Home Access: Stairs to enter Entrance Stairs-Rails: Right;Left;Can reach both Entrance Stairs-Number of Steps: 3   Home Layout: Two level;Able to live on main level with bedroom/bathroom;Full bath on main level Home Equipment: Rolling Walker (2 wheels);Wheelchair - manual;Shower seat;BSC/3in1      Prior Function Prior Level of Function : Needs assist       Physical Assist : Mobility (physical);ADLs (physical) Mobility (physical): Transfers;Bed  mobility;Gait;Stairs   Mobility Comments: household and short distanced community ambulator without AD ADLs Comments: Assisted by family     Hand Dominance        Extremity/Trunk Assessment   Upper Extremity Assessment Upper Extremity Assessment: Overall WFL for tasks assessed    Lower Extremity Assessment Lower Extremity Assessment: Overall WFL for tasks assessed    Cervical / Trunk Assessment Cervical / Trunk Assessment: Kyphotic  Communication   Communication: No difficulties  Cognition Arousal/Alertness: Awake/alert Behavior During Therapy: WFL for tasks assessed/performed Overall Cognitive Status: Within Functional Limits for tasks assessed                                          General Comments      Exercises     Assessment/Plan    PT Assessment Patient does not need any further PT services  PT Problem List         PT Treatment Interventions      PT Goals (Current goals can be found in the Care Plan section)  Acute Rehab PT Goals Patient Stated Goal: return home with family to assist PT Goal Formulation: With patient Time For Goal Achievement: 07/01/22 Potential to Achieve Goals: Good    Frequency       Co-evaluation               AM-PAC PT "6 Clicks" Mobility  Outcome Measure Help needed turning from your back to your side while in a flat bed without using bedrails?: None Help needed moving from lying on your back to sitting on the side of a flat bed without using bedrails?: None Help needed moving to and from a bed to a chair (including a wheelchair)?: None Help needed standing up from a chair using your arms (e.g., wheelchair or bedside chair)?: None Help needed to walk in hospital room?: None Help needed climbing 3-5 steps with a railing? : A Little 6 Click Score: 23    End of Session   Activity Tolerance: Patient tolerated treatment well Patient left: in chair;with call bell/phone within reach Nurse  Communication: Mobility status PT Visit Diagnosis: Unsteadiness on feet (R26.81);Other abnormalities of gait and mobility (R26.89);Muscle weakness (generalized) (M62.81)    Time: 1610-9604 PT Time Calculation (min) (ACUTE ONLY): 22 min   Charges:   PT Evaluation $PT Eval Moderate Complexity: 1 Mod PT Treatments $Therapeutic Activity: 8-22 mins        11:40 AM, 07/01/22 Ocie Bob, MPT Physical Therapist with [facility] 336 [phone] office [phone] mobile phone

## 2022-07-02 LAB — CULTURE, BLOOD (ROUTINE X 2): Culture: NO GROWTH

## 2022-07-03 LAB — CULTURE, BLOOD (ROUTINE X 2)

## 2022-07-13 ENCOUNTER — Other Ambulatory Visit: Payer: Self-pay

## 2022-07-13 ENCOUNTER — Inpatient Hospital Stay (HOSPITAL_BASED_OUTPATIENT_CLINIC_OR_DEPARTMENT_OTHER): Payer: Medicare HMO | Admitting: Hematology

## 2022-07-13 ENCOUNTER — Inpatient Hospital Stay: Payer: Medicare HMO | Attending: Hematology

## 2022-07-13 VITALS — BP 134/61 | HR 107 | Temp 98.6°F | Resp 18 | Wt 108.2 lb

## 2022-07-13 DIAGNOSIS — D696 Thrombocytopenia, unspecified: Secondary | ICD-10-CM

## 2022-07-13 DIAGNOSIS — D693 Immune thrombocytopenic purpura: Secondary | ICD-10-CM | POA: Diagnosis present

## 2022-07-13 DIAGNOSIS — C9110 Chronic lymphocytic leukemia of B-cell type not having achieved remission: Secondary | ICD-10-CM

## 2022-07-13 LAB — CBC WITH DIFFERENTIAL (CANCER CENTER ONLY)
Abs Immature Granulocytes: 0.04 10*3/uL (ref 0.00–0.07)
Basophils Absolute: 0.1 10*3/uL (ref 0.0–0.1)
Basophils Relative: 0 %
Eosinophils Absolute: 0 10*3/uL (ref 0.0–0.5)
Eosinophils Relative: 0 %
HCT: 34 % — ABNORMAL LOW (ref 36.0–46.0)
Hemoglobin: 10.9 g/dL — ABNORMAL LOW (ref 12.0–15.0)
Immature Granulocytes: 0 %
Lymphocytes Relative: 53 %
Lymphs Abs: 8.2 10*3/uL — ABNORMAL HIGH (ref 0.7–4.0)
MCH: 26.7 pg (ref 26.0–34.0)
MCHC: 32.1 g/dL (ref 30.0–36.0)
MCV: 83.3 fL (ref 80.0–100.0)
Monocytes Absolute: 0.8 10*3/uL (ref 0.1–1.0)
Monocytes Relative: 5 %
Neutro Abs: 6.7 10*3/uL (ref 1.7–7.7)
Neutrophils Relative %: 42 %
Platelet Count: 308 10*3/uL (ref 150–400)
RBC: 4.08 MIL/uL (ref 3.87–5.11)
RDW: 16.2 % — ABNORMAL HIGH (ref 11.5–15.5)
Smear Review: NORMAL
WBC Count: 15.8 10*3/uL — ABNORMAL HIGH (ref 4.0–10.5)
nRBC: 0 % (ref 0.0–0.2)

## 2022-07-13 LAB — CMP (CANCER CENTER ONLY)
ALT: 6 U/L (ref 0–44)
AST: 13 U/L — ABNORMAL LOW (ref 15–41)
Albumin: 3.3 g/dL — ABNORMAL LOW (ref 3.5–5.0)
Alkaline Phosphatase: 56 U/L (ref 38–126)
Anion gap: 8 (ref 5–15)
BUN: 10 mg/dL (ref 8–23)
CO2: 27 mmol/L (ref 22–32)
Calcium: 9.1 mg/dL (ref 8.9–10.3)
Chloride: 102 mmol/L (ref 98–111)
Creatinine: 0.69 mg/dL (ref 0.44–1.00)
GFR, Estimated: >60 mL/min (ref >60)
Glucose, Bld: 100 mg/dL — ABNORMAL HIGH (ref 70–99)
Potassium: 3.8 mmol/L (ref 3.5–5.1)
Sodium: 137 mmol/L (ref 135–145)
Total Bilirubin: 0.7 mg/dL (ref 0.3–1.2)
Total Protein: 6.6 g/dL (ref 6.5–8.1)

## 2022-07-13 LAB — LACTATE DEHYDROGENASE: LDH: 142 U/L (ref 98–192)

## 2022-07-13 NOTE — Progress Notes (Signed)
HEMATOLOGY/ONCOLOGY CLINIC NOTE  Date of Service: 07/13/22    Patient Care Team: Rodrigo Ran, MD as PCP - General (Internal Medicine)  CHIEF COMPLAINTS/PURPOSE OF CONSULTATION:  Follow-up for continued evaluation and management of CLL  Oncologic History:  Oncology History  Breast cancer of upper-outer quadrant of left female breast (HCC)  09/16/2010 Surgery   Left breast 1.5 cm invasive ductal carcinoma with calcifications, grade 1, perineural invasion was seen, DCIS, margins negative, 2 sentinel nodes negative, ER 99%, PR negative, Ki-67 79%, HER-2 negative, Oncotype 24, 16% ROR   10/25/2010 -  Anti-estrogen oral therapy   Arimidex 1 mg daily developed side effects and was held briefly and restarted   Lymphoma, small lymphocytic (HCC)  11/16/2014 Initial Diagnosis   Lymphoma, small lymphocytic: Based on cervical lymph node biopsy CD5 positive CD10 negative   CLL (chronic lymphocytic leukemia) (HCC)  12/12/2020 Cancer Staging   Staging form: Chronic Lymphocytic Leukemia / Small Lymphocytic Lymphoma, AJCC 8th Edition - Clinical stage from 12/12/2020: Modified Rai Stage IV (Modified Rai risk: High, Lymphocytosis: Present, Adenopathy: Present, Organomegaly: Present, Anemia: Present, Thrombocytopenia: Present) - Signed by Johney Maine, MD on 12/15/2020 Stage prefix: Post-therapy Response to neoadjuvant therapy: Unknown   12/15/2020 Initial Diagnosis   CLL (chronic lymphocytic leukemia) (HCC)     HISTORY OF PRESENTING ILLNESS:  Please see previous note for details of initial presentation  INTERVAL HISTORY:  Brianna York is a wonderful 88 y.o. female who has been connected for continued evaluation and management of her CLL and ITP related thrombocytopenia.   I had a phone visit with the patient on 05/08/2022 and she complained of lethargy and nausea.   Patient is accompanied by her brother during this visit. She notes she has been doing well overall without any new  medical concerns since our last visit. She is unsure that her daughter called our office regarding her moving  close to her.   Patient's brother notes that the patient has missed some of her Promacta doses. She is currently taking Promacta 25 mg three days a week.   She complains of itchiness throughout her body, but mostly near her back, bilateral collar bone, and chest area.  Patient's brother notes that she has not been eating well and has lost about 10-15 lbs since our last visit.   She denies fever, chills, night sweats, nausea, infection issues, back pain, chest pain, abdominal pain, abnormal bowel movement, or leg swelling. Patient brother notes that she does not get up from her bed much.   Patient's brother notes she was admitted to the hospital since our last visit because of wounds due to itching her body.   She is complaint with all of her medications. Patient notes her medications helps with her memory.    MEDICAL HISTORY:  Past Medical History:  Diagnosis Date   Adenomatous colon polyp    tubular   Arthritis    osteo arthritis   Breast cancer (HCC) 02/16/2011   Diverticulosis    GERD (gastroesophageal reflux disease)    Hyperlipidemia    Leukemia (HCC)    Lymphoma (HCC)    small lympocytic   Osteopenia     SURGICAL HISTORY: Past Surgical History:  Procedure Laterality Date   bartholian cyst  1986   BREAST LUMPECTOMY Left 09-16-2010   CATARACT EXTRACTION, BILATERAL     CESAREAN SECTION  1958, 1960, 1963   KNEE ARTHROSCOPY Right 2010   LYMPH GLAND EXCISION Right 11/16/2014   Procedure: EXCISION AND  BIOPSY OF RIGHT CERVICAL LYMPH NODE;  Surgeon: Avel Peace, MD;  Location: [facility];  Service: General;  Laterality: Right;   TONSILLECTOMY  1940    SOCIAL HISTORY: Social History   Socioeconomic History   Marital status: Widowed    Spouse name: Not on file   Number of children: 5   Years of education: Not on file   Highest education level:  Not on file  Occupational History   Occupation: retired  Tobacco Use   Smoking status: Former    Types: Cigarettes    Quit date: 1989    Years since quitting: 35.3   Smokeless tobacco: Never   Tobacco comments:    Quit smoking 27 years ago  Building services engineer Use: Never used  Substance and Sexual Activity   Alcohol use: Yes    Comment: occasional glass wine   Drug use: No   Sexual activity: Not Currently  Other Topics Concern   Not on file  Social History Narrative   Not on file   Social Determinants of Health   Financial Resource Strain: Not on file  Food Insecurity: Not on file  Transportation Needs: Not on file  Physical Activity: Not on file  Stress: Not on file  Social Connections: Not on file  Intimate Partner Violence: Not on file    FAMILY HISTORY: Family History  Problem Relation Age of Onset   Colon cancer Mother 79   Colon cancer Maternal Aunt        had colon sugery   Leukemia Father    Diabetes Brother    Diabetes Brother     ALLERGIES:  is allergic to penicillins, ciprofloxacin, loratadine-pseudoephedrine er, and tolterodine tartrate.  MEDICATIONS:  Current Outpatient Medications  Medication Sig Dispense Refill   acalabrutinib maleate (CALQUENCE) 100 MG tablet Take 1 tablet (100 mg total) by mouth daily. 30 tablet 1   alendronate (FOSAMAX) 70 MG tablet Take 70 mg by mouth once a week. Take with a full glass of water on an empty stomach.     atorvastatin (LIPITOR) 10 MG tablet Take 10 mg by mouth daily.     cetirizine (ZYRTEC) 10 MG tablet TAKE ONE TABLET AT BEDTIME (Patient not taking: Reported on 06/29/2022) 30 tablet 0   Cyanocobalamin (B-12) 1000 MCG SUBL Place 1,000 mcg under the tongue daily. (Patient not taking: Reported on 06/29/2022) 30 tablet 11   eltrombopag (PROMACTA) 25 MG tablet Take 1 tablet (25 mg total) by mouth daily. Take on an empty stomach, 1 hour before a meal or 2 hours after. 30 tablet 2   galantamine (RAZADYNE ER) 8 MG 24 hr  capsule Take 8 mg by mouth every morning.     memantine (NAMENDA) 10 MG tablet Take 10 mg by mouth 2 (two) times daily.     Vitamin D, Ergocalciferol, (DRISDOL) 50000 UNITS CAPS capsule Take 50,000 Units by mouth every 7 (seven) days.     No current facility-administered medications for this visit.    REVIEW OF SYSTEMS:    .10 Point review of Systems was done is negative except as noted above.  PHYSICAL EXAMINATION: .BP 134/61   Pulse (!) 107   Temp 98.6 F (37 C)   Resp 18   Wt 108 lb 3.2 oz (49.1 kg)   SpO2 100%   BMI 21.85 kg/m   GENERAL:alert, in no acute distress and comfortable SKIN:open skin wounds from implusive scratching over upper back and upper anterior chest wall and shoulders  EYES: conjunctiva are pink and non-injected, sclera anicteric OROPHARYNX: MMM, no exudates, no oropharyngeal erythema or ulceration NECK: supple, no JVD LYMPH:  no palpable lymphadenopathy in the cervical, axillary or inguinal regions LUNGS: clear to auscultation b/l with normal respiratory effort HEART: regular rate & rhythm ABDOMEN:  normoactive bowel sounds , non tender, not distended. Extremity: no pedal edema PSYCH: alert & oriented x 3 with fluent speech NEURO: no focal motor/sensory deficits     LABORATORY DATA:  I have reviewed the data as listed  .    Latest Ref Rng & Units 07/13/2022    1:58 PM 06/29/2022    6:12 AM 06/28/2022    4:16 PM  CBC  WBC 4.0 - 10.5 K/uL 15.8  9.0  14.2   Hemoglobin 12.0 - 15.0 g/dL 40.9  9.4  81.1   Hematocrit 36.0 - 46.0 % 34.0  30.4  36.7   Platelets 150 - 400 K/uL 308  236  293     .    Latest Ref Rng & Units 07/13/2022    1:58 PM 07/01/2022    4:07 AM 06/29/2022    6:12 AM  CMP  Glucose 70 - 99 mg/dL 914   782   BUN 8 - 23 mg/dL 10   15   Creatinine 9.56 - 1.00 mg/dL 2.13  0.86  5.78   Sodium 135 - 145 mmol/L 137   132   Potassium 3.5 - 5.1 mmol/L 3.8   4.2   Chloride 98 - 111 mmol/L 102   106   CO2 22 - 32 mmol/L 27   23    Calcium 8.9 - 10.3 mg/dL 9.1   7.3   Total Protein 6.5 - 8.1 g/dL 6.6     Total Bilirubin 0.3 - 1.2 mg/dL 0.7     Alkaline Phos 38 - 126 U/L 56     AST 15 - 41 U/L 13     ALT 0 - 44 U/L 6      Lab Results  Component Value Date   LDH 142 07/13/2022     Surgical Pathology  CASE: [id number]  PATIENT: Ryonna Schimpf  Flow Pathology Report   Clinical history: right orbital mass   DIAGNOSIS:   - Monoclonal B-cell population identified, see comment.   COMMENT:   There is a monoclonal population of B-cells with expression of CD5 and  CD200. The phenotype is consistent with the patient's history of small  lymphocytic lymphoma.   02/21/18 Left Lacrimal sac biopsy:    12/18/14 Cytogenetics:    12/04/14 Right cervical lymph node biopsy:   06/27/18 FISH CLL Prognostic Panel:    RADIOGRAPHIC STUDIES: I have personally reviewed the radiological images as listed and agreed with the findings in the report. DG Chest Port 1 View  Result Date: 06/28/2022 CLINICAL DATA:  Questionable sepsis. EXAM: PORTABLE CHEST 1 VIEW COMPARISON:  Chest x-ray dated 09/15/2010 FINDINGS: Cardiomegaly. Coarse lung markings bilaterally. No confluent opacity to suggest a consolidating pneumonia. No pleural effusion or pneumothorax is seen. Osseous structures about the chest are unremarkable. IMPRESSION: 1. No active disease. No evidence of pneumonia or pulmonary edema. 2. Cardiomegaly. Electronically Signed   By: Bary Richard M.D.   On: 06/28/2022 17:27    ASSESSMENT & PLAN:   88 y.o. female with  1. Chronic Lymphocytic Leukemia 11/16/14 Right cervical lymph node biopsy revealed SLL/CLL 12/18/14 Cytogenetics revealed 59% of cells with gain of CEP12 and 5% of cells with loss of ATM 02/21/18 Right lacrimal  gland revealed lymphoid infiltrates 06/27/18 FISH CLL Prognostic Panel revealed 49.33% of cells with Trisomy 12, and only 2% of cells with 17p mutation  08/03/18 PET/CT revealed "Exam positive for FDG  avid adenopathy within the neck, chest, abdomen and pelvis compatible with the clinical history of chronic lymphocytic leukemia. 2. Splenomegaly (about 13cm) with mild increased radiotracer uptake throughout the spleen (above blood pool activity)".   2. History of Invasive Ductal Carcinoma 09/16/10 Left breast 1.5cm IDC with calcifications, grade 1, perineural invasion. DCIS margins negative, 2 sentinel nodes negative, ER 99%, PR negative, Ki-67 at 79%, HER-2 negative, Oncotype 24, 16% ROR Continues on 1mg  Anastrozole daily, began on 10/25/10. Last available Mammogram on 12/31/16 negative  3.  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   -Right orbital biopsy from 11/23/2019 showed CLL/SLL.  -Received radiation treatment with temporary improvement.  4.  Leukocytosis/Anemia/Thrombocytopenia: -Secondary to CLL -Evidence of splenomegaly and possible element of ITP  5. Likely Dementia -Patient has significant cognitive deficits. Brother has been actively caring for her. She notes she is comfortable with her current living arrangement and does not want to move closer to her kids.  PLAN:  -Discussed lab results from today, 07/13/2022, with the patient and her brother. CBC shows elevated WBC at 15.8 K, decreased hemoglobin of 10.9, and decreased hematocrit of 34.0. CMP is stable.  -We will hold Promacta because her platelets are stable at 308k.  -recommended patient to continue to eat a balanced diet and to stay hydrated by drinking more water -Patient has no bleeding issues or other notable toxicities from her acalabrutinib -recommend mittens/gloves and keep nails short to avoid impulsive scratching and skin injury.  FOLLOW-UP:    Check-out note: -Return to clinic in 4 weeks with labs     The total time spent in the appointment was 21 minutes* .  All of the patient's questions were answered with apparent satisfaction. The patient knows to call the clinic with any  problems, questions or concerns.   Wyvonnia Lora MD MS AAHIVMS [facility] Hematology/Oncology Physician [facility]  .*Total Encounter Time as defined by the Centers for Medicare and Medicaid Services includes, in addition to the face-to-face time of a patient visit (documented in the note above) non-face-to-face time: obtaining and reviewing outside history, ordering and reviewing medications, tests or procedures, care coordination (communications with other health care professionals or caregivers) and documentation in the medical record.   I, Ok Edwards, am acting as a Neurosurgeon for Wyvonnia Lora, MD.  .I have reviewed the above documentation for accuracy and completeness, and I agree with the above. Johney Maine MD

## 2022-07-14 ENCOUNTER — Telehealth: Payer: Self-pay | Admitting: Hematology

## 2022-07-19 ENCOUNTER — Encounter: Payer: Self-pay | Admitting: Hematology

## 2022-07-21 ENCOUNTER — Encounter: Payer: Self-pay | Admitting: Hematology

## 2022-07-21 ENCOUNTER — Telehealth: Payer: Self-pay | Admitting: Pharmacy Technician

## 2022-07-21 ENCOUNTER — Other Ambulatory Visit (HOSPITAL_COMMUNITY): Payer: Self-pay

## 2022-07-21 NOTE — Telephone Encounter (Signed)
Oral Oncology Patient Advocate Encounter   Was successful in securing patient a $ 4,500 grant from [employer] ([employer]) to provide copayment coverage for Calquence.  This will keep the out of pocket expense at $0.     I have spoken with the patient.  The billing information is as follows and has been shared with [employer].   Member ID: [id number] Group ID: [id number] RxBin: [id number] Dates of Eligibility: 04/22/22 through 07/21/23  Fund:  CLL  Jinger Neighbors, CPhT-Adv Oncology Pharmacy Patient Advocate [facility] Direct Number: [phone]  Fax: [phone]

## 2022-08-03 ENCOUNTER — Encounter: Payer: Self-pay | Admitting: Hematology

## 2022-08-06 ENCOUNTER — Other Ambulatory Visit: Payer: Self-pay

## 2022-08-06 DIAGNOSIS — C9110 Chronic lymphocytic leukemia of B-cell type not having achieved remission: Secondary | ICD-10-CM

## 2022-08-06 NOTE — Progress Notes (Signed)
Referral faxed to [facility]  Pt is moving to [place] where her daughter is located Pt is going to ALF

## 2022-08-21 ENCOUNTER — Other Ambulatory Visit: Payer: Medicare HMO

## 2022-08-21 ENCOUNTER — Ambulatory Visit: Payer: Medicare HMO | Admitting: Hematology

## 2022-09-02 IMAGING — CT CT ANGIO CHEST
1 of 2 series · 17 of 32 positions shown · IV contrast (APPLIED)
Comparison: August 28, 2019.

CLINICAL DATA: Shortness of breath, abdominal pain. History of
chronic lymphocytic leukemia.

EXAM:
CT ANGIOGRAPHY CHEST
CT ABDOMEN AND PELVIS WITH CONTRAST
TECHNIQUE: Multidetector CT imaging of the chest was performed using the
standard protocol during bolus administration of intravenous
contrast. Multiplanar CT image reconstructions and MIPs were
obtained to evaluate the vascular anatomy. Multidetector CT imaging
of the abdomen and pelvis was performed using the standard protocol
during bolus administration of intravenous contrast.
CONTRAST:  75mL LWGIPJ-XJZ IOPAMIDOL (LWGIPJ-XJZ) INJECTION 76%

[Series 10: thins 1.0 b31s · axial · 0.64mm/px · z∈[-275,-23]mm · 17 of 280 slices shown]
[im 14/280  lung]
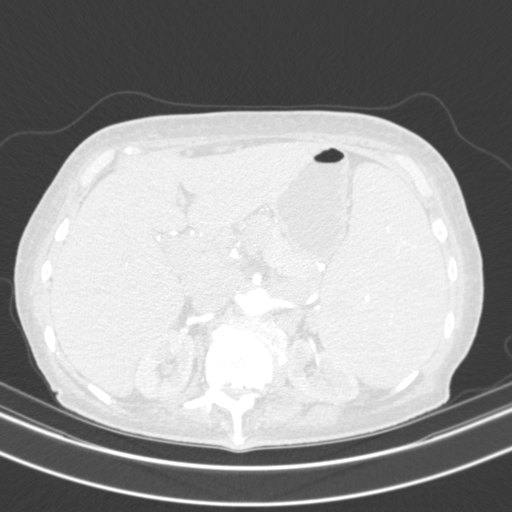
[im 28/280  mediastinal]
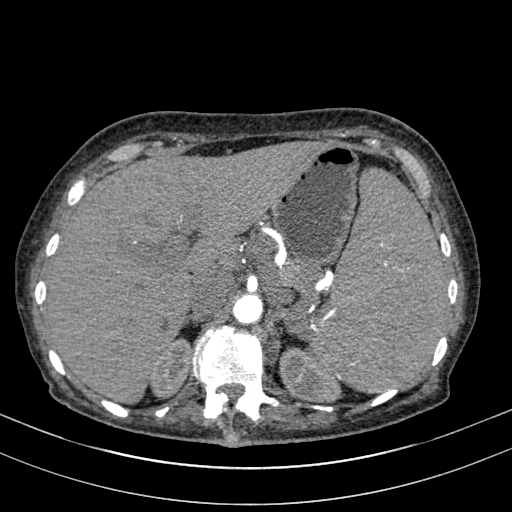
[im 56/280  lung]
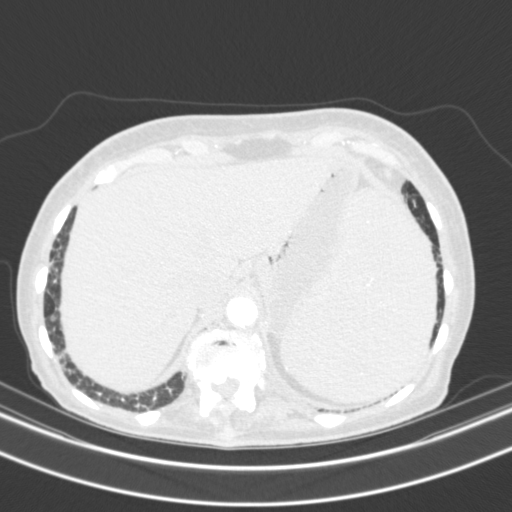
[im 70/280  mediastinal]
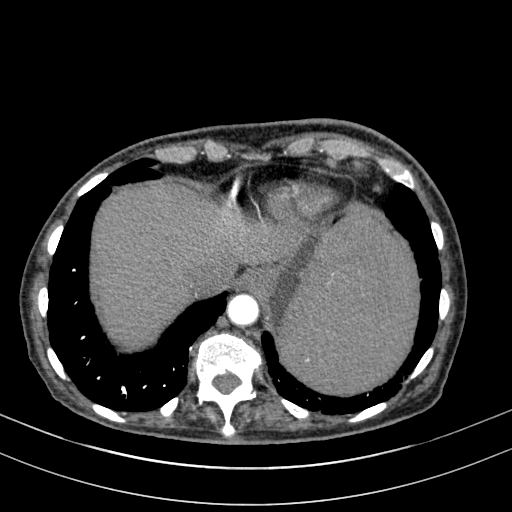
[im 84/280  lung]
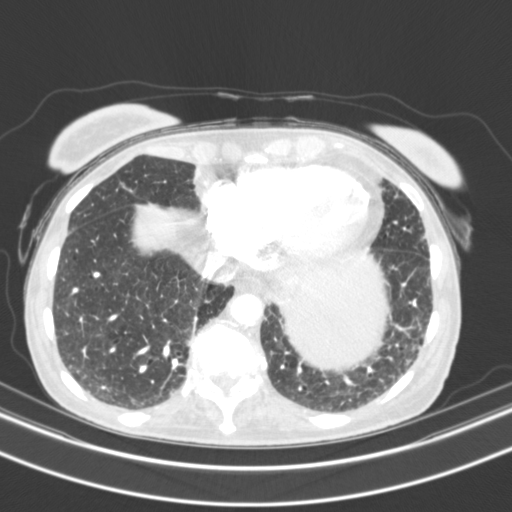
[im 98/280  mediastinal]
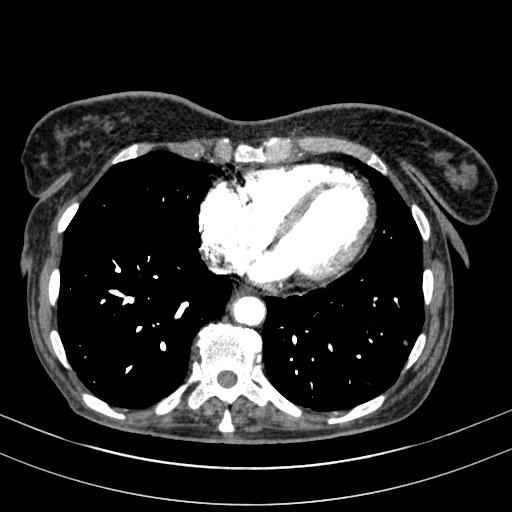
[im 112/280  lung]
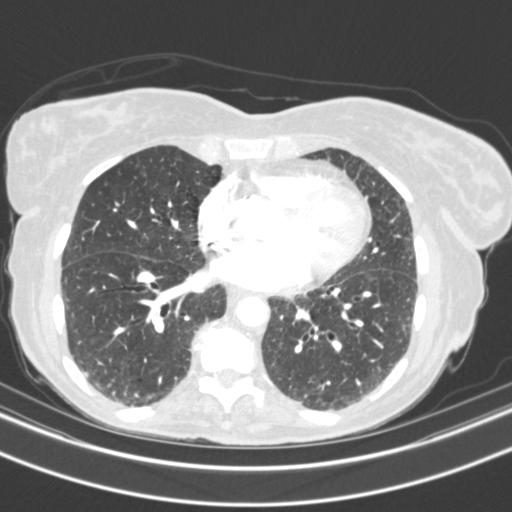
[im 126/280  mediastinal]
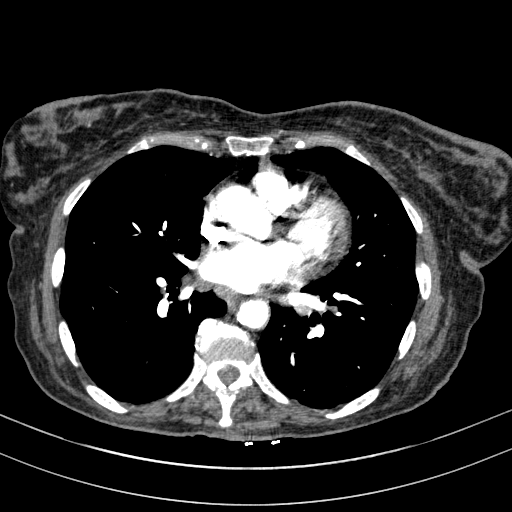
[im 140/280  lung]
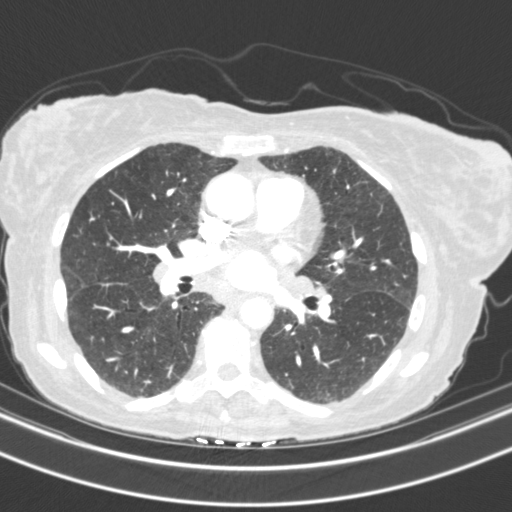
[im 154/280  mediastinal]
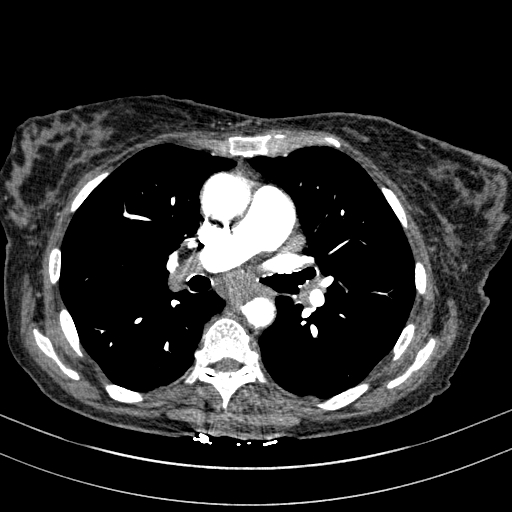
[im 168/280  lung]
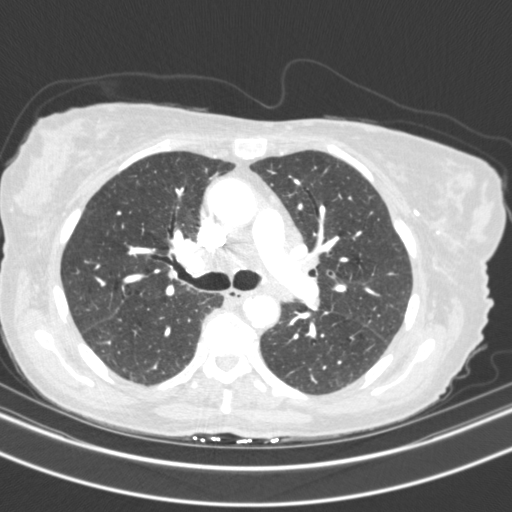
[im 182/280  mediastinal]
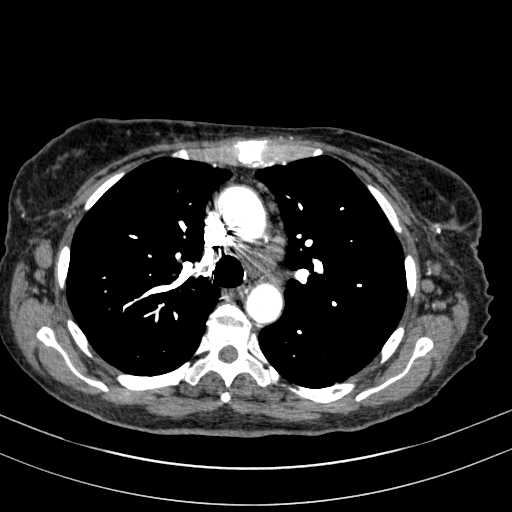
[im 196/280  lung]
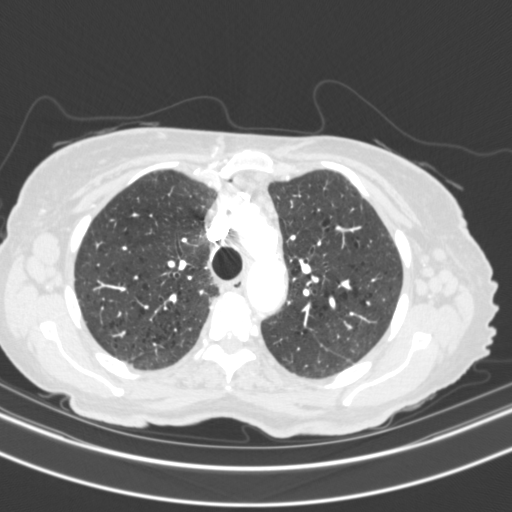
[im 210/280  mediastinal]
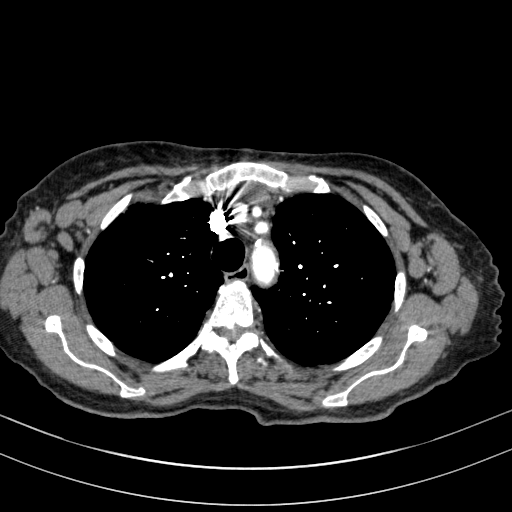
[im 224/280  lung]
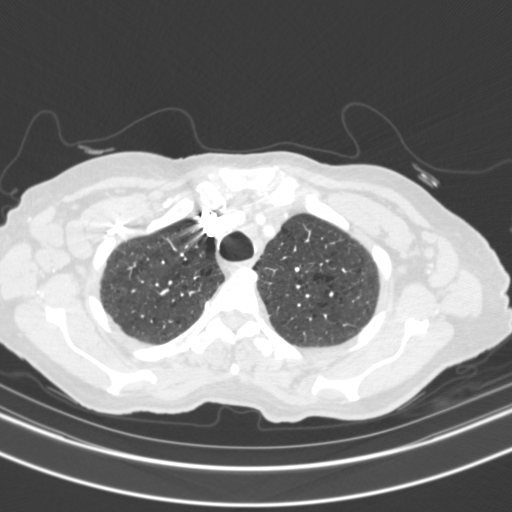
[im 252/280  mediastinal]
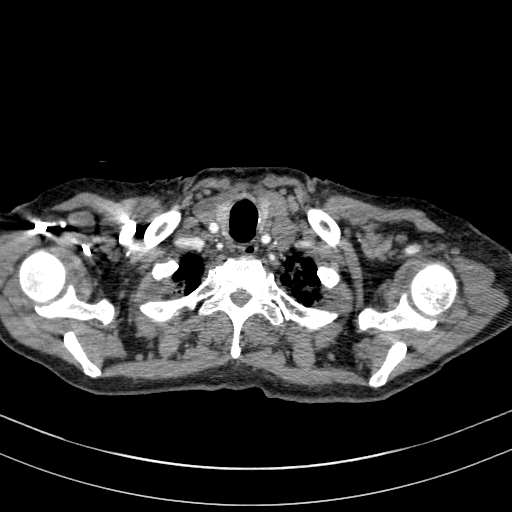
[im 266/280  lung]
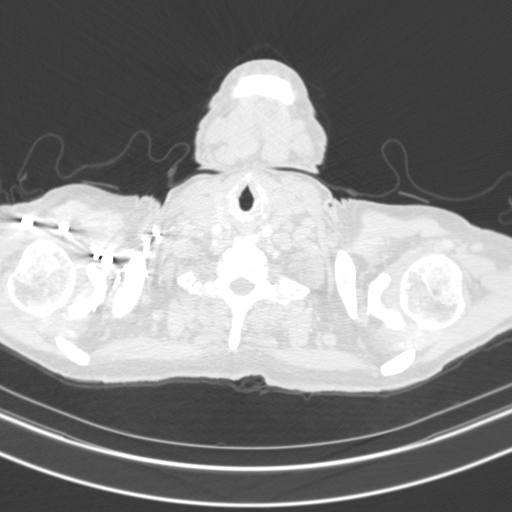

[17 of 32 positions shown; findings below may reference images not displayed]

FINDINGS: CTA CHEST FINDINGS

Cardiovascular: Satisfactory opacification of the pulmonary arteries
to the segmental level. No evidence of pulmonary embolism. Normal
heart size. No pericardial effusion. Atherosclerosis of thoracic
aorta is noted without aneurysm or dissection.

Mediastinum/Nodes: Bilateral axillary adenopathy is noted consistent
with history of CLL. Bilateral hilar and subcarinal adenopathy is
also noted. Subcarinal adenopathy measures 2 cm. 1.7 cm
aortopulmonary window lymph node is noted. Thyroid gland is
unremarkable. Esophagus is unremarkable.

Lungs/Pleura: No pneumothorax or pleural effusion is noted.
Emphysematous disease is noted in the upper lobes bilaterally. Mild
biapical scarring is noted. Minimal bibasilar subsegmental
atelectasis is noted. 6 mm nodule is noted laterally in left lower
lobe best seen on image number 92 of series 12.

Musculoskeletal: No chest wall abnormality. No acute or significant
osseous findings.

Review of the MIP images confirms the above findings.

CT ABDOMEN and PELVIS FINDINGS

Hepatobiliary: No focal liver abnormality is seen. No gallstones,
gallbladder wall thickening, or biliary dilatation.

Pancreas: Unremarkable. No pancreatic ductal dilatation or
surrounding inflammatory changes.

Spleen: Stable moderate to severe splenomegaly is noted.

Adrenals/Urinary Tract: Adrenal glands are unremarkable. Kidneys are
normal, without renal calculi, focal lesion, or hydronephrosis.
Bladder is unremarkable.

Stomach/Bowel: Stomach is within normal limits. Appendix appears
normal. No evidence of bowel wall thickening, distention, or
inflammatory changes.

Vascular/Lymphatic: Atherosclerosis of abdominal aorta is noted
without aneurysm or dissection. There is again noted enlarge
portacaval, retroperitoneal, pelvic and inguinal lymph nodes which
may be slightly enlarged compared to prior exam. Largest lymph node
measures 2.4 cm in the right periaortic region which measured 1.4 cm
on prior exam.

Reproductive: Uterus and bilateral adnexa are unremarkable.

Other: No abdominal wall hernia or abnormality. No abdominopelvic
ascites.

Musculoskeletal: Severe multilevel degenerative disc disease is seen
involving the visualized thoracic and lumbar spine. No acute osseous
abnormality is noted.

Review of the MIP images confirms the above findings.
IMPRESSION: 1. No definite evidence of pulmonary embolus.
2. Atherosclerosis of thoracic and abdominal aorta is noted without
aneurysm or dissection.
3. Bilateral axillary, subcarinal, hilar, retroperitoneal, pelvic
and inguinal adenopathy is noted consistent with history of CLL.
4. Stable moderate to severe splenomegaly is noted.
5. 6 mm nodule is noted laterally in left lower lobe.
6. Severe multilevel degenerative disc disease is seen involving the
visualized thoracic and lumbar spine.
7. Emphysema and aortic atherosclerosis.

Aortic Atherosclerosis (RFJBM-KRC.C) and Emphysema (RFJBM-OJU.M).

## 2022-09-18 ENCOUNTER — Other Ambulatory Visit: Payer: Self-pay

## 2022-09-18 DIAGNOSIS — C9110 Chronic lymphocytic leukemia of B-cell type not having achieved remission: Secondary | ICD-10-CM

## 2022-09-18 MED ORDER — CALQUENCE 100 MG PO TABS
100.0000 mg | ORAL_TABLET | Freq: Every day | ORAL | 1 refills | Status: AC
Start: 2022-09-18 — End: ?

## 2023-10-12 ENCOUNTER — Other Ambulatory Visit (INDEPENDENT_AMBULATORY_CARE_PROVIDER_SITE_OTHER): Payer: Self-pay | Admitting: Hematology

## 2023-11-01 ENCOUNTER — Encounter: Payer: Self-pay | Admitting: Diagnostic Radiology

## 2023-11-03 ENCOUNTER — Ambulatory Visit
Admission: RE | Admit: 2023-11-03 | Discharge: 2023-11-03 | Disposition: A | Discharge status: Home / Self Care | Source: Ambulatory Visit | Attending: Diagnostic Radiology | Admitting: Diagnostic Radiology

## 2023-11-03 ENCOUNTER — Encounter: Payer: Self-pay | Admitting: Diagnostic Radiology

## 2023-11-03 ENCOUNTER — Encounter
Admission: RE | Disposition: A | Discharge status: Home / Self Care | Payer: Self-pay | Source: Ambulatory Visit | Attending: Diagnostic Radiology

## 2023-11-03 DIAGNOSIS — C9110 Chronic lymphocytic leukemia of B-cell type not having achieved remission: Secondary | ICD-10-CM | POA: Insufficient documentation

## 2023-11-03 DIAGNOSIS — R059 Cough, unspecified: Secondary | ICD-10-CM

## 2023-11-03 HISTORY — DX: Personal history of malignant neoplasm of breast: Z85.3

## 2023-11-03 HISTORY — PX: BIOPSY, BONE MARROW: IMG2537

## 2023-11-03 HISTORY — DX: Chronic lymphocytic leukemia of B-cell type not having achieved remission: C91.10

## 2023-11-03 HISTORY — DX: Unspecified dementia, unspecified severity, without behavioral disturbance, psychotic disturbance, mood disturbance, and anxiety: F03.90

## 2023-11-03 LAB — CBC WITH MANUAL DIFFERENTIAL
Absolute Atypical Lymphocytes: 0.03 x10 3/uL — ABNORMAL HIGH (ref <=0.00)
Absolute Bands: 0 x10 3/uL (ref 0.00–1.00)
Absolute Basophils: 0.03 x10 3/uL (ref 0.00–0.08)
Absolute Eosinophils: 0.06 x10 3/uL (ref 0.00–0.44)
Absolute Lymphocytes: 1.03 x10 3/uL (ref 0.42–3.22)
Absolute Monocytes: 0.19 x10 3/uL — ABNORMAL LOW (ref 0.21–0.85)
Absolute Neutrophils: 1.86 x10 3/uL (ref 1.10–6.33)
Atypical Lympocytes %: 1 %
Bands %: 0 %
Basophils %: 1 %
Eosinophils %: 2 %
Hematocrit: 27.4 % — ABNORMAL LOW (ref 34.7–43.7)
Hemoglobin: 9.2 g/dL — ABNORMAL LOW (ref 11.4–14.8)
Lymphocytes %: 32 %
MCH: 31.4 pg (ref 25.1–33.5)
MCHC: 33.6 g/dL (ref 31.5–35.8)
MCV: 93.5 fL (ref 78.0–96.0)
MPV: 9.5 fL (ref 8.9–12.5)
Monocytes %: 6 %
Neutrophils %: 58 %
Platelet Count: 101 x10 3/uL — ABNORMAL LOW (ref 142–346)
Platelet Estimate: DECREASED — AB
RBC Morphology: NORMAL
RBC: 2.93 x10 6/uL — ABNORMAL LOW (ref 3.90–5.10)
RDW: 17 % — ABNORMAL HIGH (ref 11–15)
WBC: 3.21 x10 3/uL (ref 3.10–9.50)
nRBC %: 0 /100{WBCs} (ref <=0.0)

## 2023-11-03 SURGERY — BIOPSY, BONE MARROW

## 2023-11-03 MED ORDER — NALOXONE HCL 0.4 MG/ML IJ SOLN (WRAP)
INTRAMUSCULAR | Status: AC
Start: 2023-11-03 — End: 2023-11-03
  Filled 2023-11-03: qty 1

## 2023-11-03 MED ORDER — FENTANYL CITRATE (PF) 50 MCG/ML IJ SOLN (WRAP)
INTRAMUSCULAR | Status: AC
Start: 2023-11-03 — End: 2023-11-03
  Filled 2023-11-03: qty 2

## 2023-11-03 MED ORDER — FENTANYL CITRATE (PF) 50 MCG/ML IJ SOLN (WRAP)
INTRAMUSCULAR | Status: AC | PRN
Start: 2023-11-03 — End: 2023-11-03
  Administered 2023-11-03: 25 ug via INTRAVENOUS

## 2023-11-03 MED ORDER — MIDAZOLAM HCL 1 MG/ML IJ SOLN (WRAP)
INTRAMUSCULAR | Status: AC | PRN
Start: 2023-11-03 — End: 2023-11-03
  Administered 2023-11-03: .5 mg via INTRAVENOUS

## 2023-11-03 MED ORDER — HEPARIN SODIUM (PORCINE) 1000 UNIT/ML IJ SOLN (WRAP)
INTRAMUSCULAR | Status: AC
Start: 2023-11-03 — End: 2023-11-03
  Filled 2023-11-03: qty 2

## 2023-11-03 MED ORDER — FLUMAZENIL 0.5 MG/5ML IV SOLN
INTRAVENOUS | Status: AC
Start: 2023-11-03 — End: 2023-11-03
  Filled 2023-11-03: qty 5

## 2023-11-03 MED ORDER — MIDAZOLAM HCL 1 MG/ML IJ SOLN (WRAP)
INTRAMUSCULAR | Status: AC
Start: 2023-11-03 — End: 2023-11-03
  Filled 2023-11-03: qty 2

## 2023-11-03 SURGICAL SUPPLY — 1 items: TRAY BIOPSY L102 MM OD11 GA ARROW ONCONTROL BONE MARROW (Needle) ×1 IMPLANT

## 2023-11-03 NOTE — Nursing Progress Note (Signed)
 1125: Received patient s/p bone marrow biopsy, patient alert and oriented, vital signs stable, patient denies any pain. Family at bedside. Safety and fall precautions in place. Call light within reach, bed in lowest position.     1220: Patient tolerated PO, demonstrated ambulation at pre-sedation ability to restroom to void post procedure. Discussed discharge instructions, including any new medication education with patient/family and answered any questions, verbalized understanding of instructions. Vital signs stable. Dressing clean and intact. Patient escorted by staff via wheelchair to vehicle, to care of family.

## 2023-11-03 NOTE — Nursing Progress Note (Signed)
 PROCEDURAL HANDOFF REPORT    Date Time: 11/03/23 11:06 AM      PROCEDURE:    Biopsy, Bone Marrow    ALLERGIES:    Ciprofloxacin, Clarithromycin, Detrol [tolterodine], Methocarbamol, Metronidazole, Penicillins, and Zoster vaccine live    PMH:  Medical History[1]  ACCESS SITE AND DRESSINGS:   Right lower back  Visual appearance: clean/dry/intact   MEDICATIONS:   Versed : 0.5 mg IV    Last dose given: 1109  Fentanyl : 25 mcg IV Last dose given: 1109                      VITALS:    BP: 130/61            Pulse: 83         O2 SAT: 99%  on RA,    Rhythm: NSR    PROCEDURE DETAILS:    Pt tolerated procedure well. Vital signs stable. Samples collected and given to pathology for analysis.   TRANSFER/DISPOSITION:      Report given to: Jeb, RN  Pt transferred to: [facility]    See physician's op note/report for more details         [1]   Past Medical History:  Diagnosis Date    CLL (chronic lymphocytic leukemia) (CMS/HCC)     Dementia (CMS/HCC)     H. pylori infection     History of right breast cancer     Lymphocytic leukemia, acute, B- and T-cell (CMS/HCC)

## 2023-11-03 NOTE — Sedation Documentation (Addendum)
 Pathology notified/bone marrow bx samples obtained

## 2023-11-03 NOTE — Discharge Instr - AVS First Page (Addendum)
 Reason for your Hospital Admission:  Bone marrow biopsy      Instructions for after your discharge:        Interventional Radiology  Discharge Instructions following Biopsy    You have had a bone marrow biopsy performed by Dr. Rosine on 11/03/23.    A biopsy is a small sample of tissue or fluid taken from the body. Image-guided biopsy allows an Interventional Radiologist take a sample from an organ or mass without surgery.  This sample can then be studied in a laboratory. The results will be sent to the doctor who wrote the order for the biopsy. Usually within 3-5 days. They may have already set up a follow-up appointment for you.    General Instructions:  Rest today post procedure.  No heavy lifting (greater than 5 pounds) or straining for the next 72 hours (3 days).  No driving for 24 hours post procedure due to medication/sedation you may have received during your procedure.  Avoid alcohol for the next 24 hours after the procedure if you received sedation.  Resume previous diet.  Site Care:   You may shower and remove the dressing tomorrow.    You may leave the site uncovered or replace with a new dry dressing or Band-Aid each day until healed.  Do NOT use creams, powders, lotions, or ointments at the site.   Bruising sometimes occurs at the site where the needle was inserted.  Call  the Interventional Radiologist if you observe:  Prolonged oozing of blood from the biopsy site  Pain at the site for more than 3 days    Increased pain or unrelieved pain  Dizziness or lightheadedness  Difficulty breathing or shortness of breath  Redness, warmth to touch, or discharge from biopsy site  If you had a lung biopsy, coughing up blood (more than 3 teaspoons of blood is significant)  Chest pain    Contact Information:  Business Hours (Mon-Fri, 8 AM-5 PM):  Interventional Radiology Central Scheduling: [phone] [facility], option #3 )  IR Patient Care Navigator (post-procedure questions/issues): [phone]  After  Hours:  Answering Service (IR on call doctor): [phone]   For emergencies, call 911 or go to the nearest ER or urgent care.    I have received and understand these discharge instructions. I have no further questions regarding these instructions.

## 2023-11-03 NOTE — H&P (Signed)
 BRIEF IR HISTORY AND PHYSICAL    Date Time: 11/03/23 10:27 AM    INDICATIONS:   Procedure(s):  Biopsy, Bone Marrow      PROCEDURALIST COMMENTS BELOW:   89F with hx of CLL, here for bm bx.    PAST MEDICAL HISTORY:   Medical History[1]    PAST SURGICAL HISTORY   Past Surgical History[2]    Family History:   Family History[3]    Social History:   Social History[4]      REVIEW OF SYSTEMS REVIEWED:   YES  ( x )        HOME MEDICATIONS   Prescriptions Prior to Admission[5]      INPATIENT MEDICATIONS    Current Facility-Administered Medications[6]          ALLERGIES:   Allergies[7]      PREVIOUS REACTION TO SEDATION MEDICATIONS     NO ( x )   YES (  )      PHYSICAL EXAM     AIRWAY CLASSIFICATION:    CLASS I   (  )   CLASS II  ( x )    CLASS III  (  )     CLASS IV  (  )    INTUBATED (  )    CARDIAC :   RRR      LUNGS:   NLR      LABS:   No results found for: WBC, HCT, PLT, INR, PT, PTT, BUN, CREAT, GLU, K        ASA PHYSICAL STATUS   (  )  ASA 1   HEALTHY PATIENT  (  )  ASA 2   MILD SYSTEMIC ILLNESS  ( x )  ASA 3   SYSTEMIC DISEASE, NOT INCAPACITATING  (  )  ASA 4   SEVERE SYSTEMIC DISEASE, DISEASE IS CONSTANT THREAT TO LIFE  (  )  ASA 5   MORIBUND CONDITION, NOT EXPECTED TO LIVE >24 HOURS            IRRESPECTIVE OF PROCEDURE  (  )  E           EMERGENCY PROCEDURE       PLANNED SEDATION:   (  ) NO SEDATION  ( x ) MODERATE SEDATION  (  ) DEEP SEDATION WITH ANESTHESIA      CONCLUSION:   PATIENT HAS BEEN REASSESSED IMMEDIATELY PRIOR TO THE PROCEDURE   AND IS AN APPROPRIATE CANDIDATE FOR THE PLANNED SEDATION AND   PROCEDURE.  RISKS, BENEFITS AND ALTERNATIVES TO THE PLANNED   PROCEDURE AND SEDATION HAVE BEEN EXPLAINED TO THE PATIENT   OR GUARDIAN.    ( x )  YES  (  )  EMERGENCY CONSENT       Signed by: Camellia CINDERELLA Lanius, MD  [facility]-Section of Vascular & Interventional Radiology  Contact Numbers:  Regular business hours (8A-5P M-F):  [facility]:  [phone] (option 3-outpatient  scheduling, option 4-consults, option 5-inpatient procedures)  [facility]:  [phone]  [facility]: [phone]  After hours/Answering service:  [phone]         [1]   Past Medical History:  Diagnosis Date    CLL (chronic lymphocytic leukemia) (CMS/HCC)     Dementia (CMS/HCC)     H. pylori infection     History of right breast cancer     Lymphocytic leukemia, acute, B- and T-cell (CMS/HCC)    [2]   Past  Surgical History:  Procedure Laterality Date    CATARACT EXTRACTION      CESAREAN SECTION      EYE SURGERY      LUMPECTOMY     [3] No family history on file.  [4]   Social History  Socioeconomic History    Marital status: Widowed   Tobacco Use    Smoking status: Never    Smokeless tobacco: Never   Vaping Use    Vaping status: Never Used   Substance and Sexual Activity    Alcohol use: Not Currently    Drug use: Not Currently     Social Drivers of Health      Received from [employer]    Social Network    Received from [facility]    HITS   [5]   Medications Prior to Admission   Medication Sig Dispense Refill Last Dose/Taking    alendronate (FOSAMAX) 70 MG tablet Take 1 tablet (70 mg) by mouth every 7 days Take 1 tab by mouth every 7 days with a full glass of water on an empty stomach. Do not take anything else by mouth or lie down for 30 mins.   11/02/2023    Ascorbic Acid (vitamin C) 250 MG tablet Take 1 tablet (250 mg) by mouth once daily   11/02/2023    atorvastatin (LIPITOR) 10 MG tablet    11/02/2023    bacitracin zinc ointment Apply topically   Taking    cetirizine (ZyrTEC) 10 MG tablet Take 1 tablet (10 mg) by mouth once daily   Taking    diphenhydrAMINE (BENADRYL) 25 mg capsule Take 1 capsule (25 mg) by mouth   Taking    escitalopram (LEXAPRO) 5 MG tablet Take 1 tablet (5 mg) by mouth once daily   11/02/2023    fluticasone (FLONASE) 50 MCG/ACT nasal spray 1 spray by Nasal route once daily   11/02/2023    folic acid (FOLVITE) 400 MCG tablet Take 1 tablet (400 mcg) by mouth once  daily   Taking    galantamine (RAZADYNE) 8 MG tablet Take 1 tablet (8 mg) by mouth 2 (two) times daily   11/02/2023    HYDROcodone -acetaminophen  (NORCO) 5-325 MG per tablet Take 1 tablet by mouth every 6 (six) hours as needed for Pain 9 tablet 0 Taking As Needed    memantine (NAMENDA) 10 MG tablet Take 1 tablet (10 mg) by mouth 2 (two) times daily   11/02/2023    montelukast (SINGULAIR) 10 MG tablet Take 1 tablet (10 mg) by mouth once at bedtime   11/02/2023    tiotropium (SPIRIVA RESPIMAT) 2.5 MCG/ACT inhalation spray Inhale 2 puffs into the lungs once daily   11/02/2023    vitamin B-12 (CYANOCOBALAMIN) 1000 MCG tablet Take 1 tablet (1,000 mcg) by mouth once daily   11/02/2023    vitamin D, ergocalciferol, (DRISDOL) 50000 UNIT Cap    11/02/2023    clarithromycin (BIAXIN) 500 MG tablet  (Patient not taking: No sig reported)   Not Taking    metroNIDAZOLE (FLAGYL) 500 MG tablet  (Patient not taking: No sig reported)   Not Taking    sulfamethoxazole-trimethoprim (BACTRIM DS) 800-160 MG per tablet sulfamethoxazole 800 mg-trimethoprim 160 mg tablet       valacyclovir (VALTREX) 1000 MG tablet  (Patient not taking: No sig reported)   Not Taking   [6] [7]   Allergies  Allergen Reactions    Ciprofloxacin     Clarithromycin     Detrol [Tolterodine]  Methocarbamol     Metronidazole     Penicillins     Zoster Vaccine Live

## 2023-11-08 ENCOUNTER — Encounter: Payer: Self-pay | Admitting: Diagnostic Radiology

## 2023-11-09 LAB — BONE MARROW FLOW CYTOMETRY

## 2023-11-22 ENCOUNTER — Encounter: Payer: Self-pay | Admitting: Surgery

## 2023-11-22 ENCOUNTER — Telehealth: Payer: Self-pay

## 2023-11-22 NOTE — Telephone Encounter (Signed)
 09.08.2025 4:07pm - contacted patient's daughter (in contacts and number listed as patient's).  Offered surgery date of 09.10.2025.  Daughter states they are undecided if they will proceed with port placement and if so, 09.10.2025 will not work.  Daughter will call back when ready to proceed.  This coordinator's number provided.    09.11.2025 1:20pm - received email from Dionna at [facility] [facility] [place] with request to cancel patient's port placement.  Per nurse, port not needed anymore.

## 2023-11-23 LAB — BONE MARROW PATHOLOGY
